# Patient Record
Sex: Female | Born: 1940 | Race: White | Hispanic: No | Smoking: Former smoker
Health system: Southern US, Community
[De-identification: ages and names within clinical notes are randomized; demographics above are authoritative.]

## PROBLEM LIST (undated history)

## (undated) DIAGNOSIS — M549 Dorsalgia, unspecified: Secondary | ICD-10-CM

## (undated) DIAGNOSIS — F329 Major depressive disorder, single episode, unspecified: Secondary | ICD-10-CM

## (undated) DIAGNOSIS — M1990 Unspecified osteoarthritis, unspecified site: Secondary | ICD-10-CM

## (undated) DIAGNOSIS — F419 Anxiety disorder, unspecified: Secondary | ICD-10-CM

## (undated) DIAGNOSIS — I10 Essential (primary) hypertension: Secondary | ICD-10-CM

## (undated) DIAGNOSIS — I219 Acute myocardial infarction, unspecified: Secondary | ICD-10-CM

## (undated) DIAGNOSIS — E785 Hyperlipidemia, unspecified: Secondary | ICD-10-CM

## (undated) DIAGNOSIS — G8929 Other chronic pain: Secondary | ICD-10-CM

## (undated) DIAGNOSIS — Z87442 Personal history of urinary calculi: Secondary | ICD-10-CM

## (undated) DIAGNOSIS — F32A Depression, unspecified: Secondary | ICD-10-CM

## (undated) DIAGNOSIS — I2510 Atherosclerotic heart disease of native coronary artery without angina pectoris: Secondary | ICD-10-CM

## (undated) DIAGNOSIS — M546 Pain in thoracic spine: Secondary | ICD-10-CM

## (undated) DIAGNOSIS — E039 Hypothyroidism, unspecified: Secondary | ICD-10-CM

## (undated) HISTORY — DX: Essential (primary) hypertension: I10

## (undated) HISTORY — PX: FOOT SURGERY: SHX648

## (undated) HISTORY — DX: Hypothyroidism, unspecified: E03.9

## (undated) HISTORY — DX: Unspecified osteoarthritis, unspecified site: M19.90

## (undated) HISTORY — DX: Atherosclerotic heart disease of native coronary artery without angina pectoris: I25.10

## (undated) HISTORY — PX: CATARACT EXTRACTION W/ INTRAOCULAR LENS IMPLANT: SHX1309

## (undated) HISTORY — DX: Hyperlipidemia, unspecified: E78.5

---

## 1996-12-03 HISTORY — PX: CORONARY ANGIOPLASTY WITH STENT PLACEMENT: SHX49

## 2001-12-30 ENCOUNTER — Ambulatory Visit (HOSPITAL_COMMUNITY): Admission: RE | Admit: 2001-12-30 | Discharge: 2001-12-30 | Payer: Self-pay | Admitting: Gastroenterology

## 2004-12-03 HISTORY — PX: CORONARY ANGIOPLASTY WITH STENT PLACEMENT: SHX49

## 2005-03-06 ENCOUNTER — Ambulatory Visit: Payer: Self-pay | Admitting: Cardiology

## 2005-03-19 ENCOUNTER — Ambulatory Visit: Payer: Self-pay

## 2005-03-29 ENCOUNTER — Ambulatory Visit: Payer: Self-pay

## 2005-04-03 ENCOUNTER — Ambulatory Visit (HOSPITAL_COMMUNITY): Admission: RE | Admit: 2005-04-03 | Discharge: 2005-04-04 | Payer: Self-pay | Admitting: Cardiology

## 2005-04-03 ENCOUNTER — Ambulatory Visit: Payer: Self-pay | Admitting: Cardiology

## 2005-04-26 ENCOUNTER — Ambulatory Visit: Payer: Self-pay | Admitting: Cardiology

## 2005-06-06 ENCOUNTER — Other Ambulatory Visit: Admission: RE | Admit: 2005-06-06 | Discharge: 2005-06-06 | Payer: Self-pay | Admitting: Family Medicine

## 2005-06-26 ENCOUNTER — Ambulatory Visit: Payer: Self-pay | Admitting: Cardiology

## 2005-11-30 ENCOUNTER — Ambulatory Visit: Payer: Self-pay | Admitting: Cardiology

## 2006-05-03 ENCOUNTER — Ambulatory Visit: Payer: Self-pay | Admitting: Cardiology

## 2006-12-03 HISTORY — PX: HAMMER TOE SURGERY: SHX385

## 2006-12-03 HISTORY — PX: CORONARY ANGIOPLASTY WITH STENT PLACEMENT: SHX49

## 2007-04-08 ENCOUNTER — Other Ambulatory Visit: Admission: RE | Admit: 2007-04-08 | Discharge: 2007-04-08 | Payer: Self-pay | Admitting: Family Medicine

## 2007-05-19 ENCOUNTER — Ambulatory Visit: Payer: Self-pay | Admitting: Cardiology

## 2007-05-27 ENCOUNTER — Ambulatory Visit: Payer: Self-pay

## 2007-06-11 ENCOUNTER — Ambulatory Visit: Payer: Self-pay | Admitting: Cardiology

## 2007-06-11 LAB — CONVERTED CEMR LAB
BUN: 16 mg/dL (ref 6–23)
Basophils Relative: 0.8 % (ref 0.0–1.0)
Eosinophils Absolute: 0.2 10*3/uL (ref 0.0–0.6)
Eosinophils Relative: 3.8 % (ref 0.0–5.0)
GFR calc Af Amer: 81 mL/min
GFR calc non Af Amer: 67 mL/min
Glucose, Bld: 124 mg/dL — ABNORMAL HIGH (ref 70–99)
INR: 0.9 (ref 0.9–2.0)
Lymphocytes Relative: 32.1 % (ref 12.0–46.0)
MCV: 87.7 fL (ref 78.0–100.0)
Monocytes Absolute: 0.2 10*3/uL (ref 0.2–0.7)
Neutrophils Relative %: 58.7 % (ref 43.0–77.0)
Platelets: 242 10*3/uL (ref 150–400)
Prothrombin Time: 11.3 s (ref 10.0–14.0)
RBC: 4.38 M/uL (ref 3.87–5.11)
Sodium: 139 meq/L (ref 135–145)
WBC: 4.4 10*3/uL — ABNORMAL LOW (ref 4.5–10.5)
aPTT: 19.8 s — ABNORMAL LOW (ref 26.5–36.5)

## 2007-06-16 ENCOUNTER — Inpatient Hospital Stay (HOSPITAL_BASED_OUTPATIENT_CLINIC_OR_DEPARTMENT_OTHER): Admission: RE | Admit: 2007-06-16 | Discharge: 2007-06-16 | Payer: Self-pay | Admitting: Cardiology

## 2007-06-16 ENCOUNTER — Ambulatory Visit: Payer: Self-pay | Admitting: Cardiology

## 2007-06-16 ENCOUNTER — Inpatient Hospital Stay (HOSPITAL_COMMUNITY): Admission: AD | Admit: 2007-06-16 | Discharge: 2007-06-17 | Payer: Self-pay | Admitting: Cardiology

## 2007-07-08 ENCOUNTER — Ambulatory Visit: Payer: Self-pay | Admitting: Cardiology

## 2008-02-27 ENCOUNTER — Ambulatory Visit: Payer: Self-pay | Admitting: Cardiology

## 2008-08-18 ENCOUNTER — Ambulatory Visit: Payer: Self-pay | Admitting: Cardiology

## 2008-11-03 ENCOUNTER — Ambulatory Visit: Payer: Self-pay | Admitting: Cardiology

## 2008-11-18 ENCOUNTER — Ambulatory Visit: Payer: Self-pay

## 2008-12-03 HISTORY — PX: CARDIAC CATHETERIZATION: SHX172

## 2008-12-08 ENCOUNTER — Ambulatory Visit: Payer: Self-pay | Admitting: Cardiology

## 2008-12-08 ENCOUNTER — Ambulatory Visit (HOSPITAL_COMMUNITY): Admission: RE | Admit: 2008-12-08 | Discharge: 2008-12-08 | Payer: Self-pay | Admitting: Cardiology

## 2008-12-20 ENCOUNTER — Ambulatory Visit: Payer: Self-pay | Admitting: Cardiology

## 2009-03-15 DIAGNOSIS — I10 Essential (primary) hypertension: Secondary | ICD-10-CM | POA: Insufficient documentation

## 2009-03-15 DIAGNOSIS — E783 Hyperchylomicronemia: Secondary | ICD-10-CM | POA: Insufficient documentation

## 2009-03-15 DIAGNOSIS — I2510 Atherosclerotic heart disease of native coronary artery without angina pectoris: Secondary | ICD-10-CM | POA: Insufficient documentation

## 2009-03-16 ENCOUNTER — Ambulatory Visit: Payer: Self-pay | Admitting: Cardiology

## 2009-03-16 ENCOUNTER — Encounter: Payer: Self-pay | Admitting: Cardiology

## 2009-03-18 ENCOUNTER — Telehealth (INDEPENDENT_AMBULATORY_CARE_PROVIDER_SITE_OTHER): Payer: Self-pay | Admitting: *Deleted

## 2009-04-08 ENCOUNTER — Encounter: Payer: Self-pay | Admitting: Cardiology

## 2009-04-08 ENCOUNTER — Other Ambulatory Visit: Admission: RE | Admit: 2009-04-08 | Discharge: 2009-04-08 | Payer: Self-pay | Admitting: Family Medicine

## 2009-06-14 ENCOUNTER — Ambulatory Visit: Payer: Self-pay | Admitting: Cardiology

## 2009-09-26 ENCOUNTER — Encounter: Payer: Self-pay | Admitting: Cardiology

## 2009-12-06 ENCOUNTER — Ambulatory Visit: Payer: Self-pay | Admitting: Cardiology

## 2009-12-07 LAB — CONVERTED CEMR LAB
GFR calc non Af Amer: 88.23 mL/min (ref >60)
Glucose, Bld: 90 mg/dL (ref 70–99)
Sodium: 142 meq/L (ref 135–145)

## 2010-01-03 DIAGNOSIS — I219 Acute myocardial infarction, unspecified: Secondary | ICD-10-CM

## 2010-01-03 HISTORY — DX: Acute myocardial infarction, unspecified: I21.9

## 2010-01-05 ENCOUNTER — Telehealth: Payer: Self-pay | Admitting: Cardiology

## 2010-01-08 ENCOUNTER — Ambulatory Visit: Payer: Self-pay | Admitting: Internal Medicine

## 2010-01-08 ENCOUNTER — Inpatient Hospital Stay (HOSPITAL_COMMUNITY): Admission: EM | Admit: 2010-01-08 | Discharge: 2010-01-10 | Payer: Self-pay | Admitting: Emergency Medicine

## 2010-01-17 ENCOUNTER — Encounter: Payer: Self-pay | Admitting: Cardiology

## 2010-01-17 ENCOUNTER — Ambulatory Visit: Payer: Self-pay | Admitting: Surgery

## 2010-01-23 ENCOUNTER — Encounter: Payer: Self-pay | Admitting: Surgery

## 2010-01-23 ENCOUNTER — Ambulatory Visit: Payer: Self-pay | Admitting: Vascular Surgery

## 2010-01-25 ENCOUNTER — Ambulatory Visit: Payer: Self-pay | Admitting: Surgery

## 2010-01-25 ENCOUNTER — Ambulatory Visit: Payer: Self-pay | Admitting: Cardiology

## 2010-01-25 ENCOUNTER — Inpatient Hospital Stay (HOSPITAL_COMMUNITY): Admission: RE | Admit: 2010-01-25 | Discharge: 2010-01-29 | Payer: Self-pay | Admitting: Surgery

## 2010-01-25 HISTORY — PX: CORONARY ARTERY BYPASS GRAFT: SHX141

## 2010-02-20 ENCOUNTER — Encounter: Admission: RE | Admit: 2010-02-20 | Discharge: 2010-02-20 | Payer: Self-pay | Admitting: Surgery

## 2010-02-20 ENCOUNTER — Ambulatory Visit: Payer: Self-pay | Admitting: Surgery

## 2010-02-21 ENCOUNTER — Ambulatory Visit: Payer: Self-pay | Admitting: Cardiology

## 2010-02-21 DIAGNOSIS — R079 Chest pain, unspecified: Secondary | ICD-10-CM | POA: Insufficient documentation

## 2010-02-23 ENCOUNTER — Ambulatory Visit: Payer: Self-pay | Admitting: Cardiology

## 2010-02-23 DIAGNOSIS — R0602 Shortness of breath: Secondary | ICD-10-CM | POA: Insufficient documentation

## 2010-02-23 DIAGNOSIS — J90 Pleural effusion, not elsewhere classified: Secondary | ICD-10-CM | POA: Insufficient documentation

## 2010-02-23 LAB — CONVERTED CEMR LAB
BUN: 16 mg/dL (ref 6–23)
Basophils Absolute: 0.1 10*3/uL (ref 0.0–0.1)
CO2: 29 meq/L (ref 19–32)
Chloride: 106 meq/L (ref 96–112)
Eosinophils Absolute: 0.2 10*3/uL (ref 0.0–0.7)
GFR calc non Af Amer: 75.58 mL/min (ref >60)
HCT: 36.7 % (ref 36.0–46.0)
MCHC: 33.3 g/dL (ref 30.0–36.0)
MCV: 91.9 fL (ref 78.0–100.0)
Monocytes Relative: 8.7 % (ref 3.0–12.0)
Neutro Abs: 2.9 10*3/uL (ref 1.4–7.7)
Neutrophils Relative %: 60.3 % (ref 43.0–77.0)
Platelets: 254 10*3/uL (ref 150.0–400.0)
Potassium: 4.5 meq/L (ref 3.5–5.1)
RBC: 4 M/uL (ref 3.87–5.11)
RDW: 13.7 % (ref 11.5–14.6)
Sodium: 143 meq/L (ref 135–145)

## 2010-02-24 ENCOUNTER — Ambulatory Visit (HOSPITAL_COMMUNITY): Admission: RE | Admit: 2010-02-24 | Discharge: 2010-02-24 | Payer: Self-pay | Admitting: Cardiology

## 2010-02-24 ENCOUNTER — Encounter: Payer: Self-pay | Admitting: Cardiology

## 2010-02-27 ENCOUNTER — Encounter: Admission: RE | Admit: 2010-02-27 | Discharge: 2010-02-27 | Payer: Self-pay | Admitting: Surgery

## 2010-02-27 ENCOUNTER — Ambulatory Visit: Payer: Self-pay | Admitting: Surgery

## 2010-03-22 ENCOUNTER — Ambulatory Visit: Payer: Self-pay | Admitting: Cardiology

## 2010-04-18 ENCOUNTER — Encounter: Payer: Self-pay | Admitting: Cardiology

## 2010-09-07 ENCOUNTER — Ambulatory Visit: Payer: Self-pay | Admitting: Cardiology

## 2010-10-19 ENCOUNTER — Encounter: Payer: Self-pay | Admitting: Cardiology

## 2011-01-04 NOTE — Assessment & Plan Note (Signed)
Summary: sob abn chest ct ./cy   Primary Provider:  Dr Clelia Croft   History of Present Illness: Erica [name] comes in today for a positive d-dimer. She's been having pleuritic chest pain on the left side. Please refer to Dr. Marian Sorrow extensive notes this week.  Her d-dimer was 2.53. I performed a chest CT today with contrast. Pre-CT creatinine was normal.  Dr. Karin Golden read it as showing a large left pleural effusion compressing the left lower lobe. It made it difficult to assess for pulmonary emboli and that area, however, he did not see any definite pulmonary embolus.  I spoken to Dr. Dickie La and we've arranged for her to have outpatient thoracentesis tomorrow with radiology. I've asked Erica. Porcher to start Aleve 400 mg twice a day as an anti-inflammatory. She'll continue her Lasix.  Allergies: No Known Drug Allergies  Past History:  Past Medical History: Last updated: 03/15/2009 1. Coronary artery disease, status post multiple percutaneous       interventions as described above with progression of disease in the       left anterior descending and diagonal branch with 80% narrowing in       the left anterior descending (bifurcation lesion), 70% narrowing in       the circumflex artery and 40% narrowing in the right coronary       artery.   2. Good left ventricular function.   3. Hypertension.   4. Hyperlipidemia.   5. Status post prior ankle surgery.   Social History: Last updated: 03/16/2009 She is married. 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 She does not smoke.  Review of Systems       negative other than history of present illness   Impression & Recommendations:  Problem # 1:  CHEST PAIN-UNSPECIFIED (ICD-786.50) Assessment Unchanged  Her updated medication list for this problem includes:    Atenolol 25 Mg Tabs (Atenolol) .Marland Kitchen... Take 1 once daily    Aspirin Ec 325 Mg Tbec (Aspirin) .Marland Kitchen... Take one tablet by mouth daily  Her updated medication  list for this problem includes:    Atenolol 25 Mg Tabs (Atenolol) .Marland Kitchen... Take 1 once daily    Aspirin Ec 325 Mg Tbec (Aspirin) .Marland Kitchen... Take one tablet by mouth daily  Problem # 2:  PLEURAL EFFUSION (ICD-511.9) Assessment: Deteriorated  Problem # 3:  SHORTNESS OF BREATH (ICD-786.05) Assessment: Unchanged  Her updated medication list for this problem includes:    Atenolol 25 Mg Tabs (Atenolol) .Marland Kitchen... Take 1 once daily    Aspirin Ec 325 Mg Tbec (Aspirin) .Marland Kitchen... Take one tablet by mouth daily    Furosemide 40 Mg Tabs (Furosemide) .Marland Kitchen... Take one tablet by mouth daily.  Her updated medication list for this problem includes:    Atenolol 25 Mg Tabs (Atenolol) .Marland Kitchen... Take 1 once daily    Aspirin Ec 325 Mg Tbec (Aspirin) .Marland Kitchen... Take one tablet by mouth daily    Furosemide 40 Mg Tabs (Furosemide) .Marland Kitchen... Take one tablet by mouth daily.  Patient Instructions: 1)  Your physician recommends that you schedule a follow-up appointment in: REPORT TO ADMITTING AT [facility] AT 10:45 FOR 11:00  thoracentisis

## 2011-01-04 NOTE — Progress Notes (Signed)
Summary: angina with exercise- in [place]  Phone Note Call from Patient Call back at Home Phone [phone] Call back at [phone] cell phone   Caller: Patient Reason for Call: Talk to Nurse Details for Reason: pt in [place] now will be in [place] on sat. c/o angina with exercise pt was admit ,but check out -AMA. Pt will bring records with her from [place].  Initial call taken by: Lorne Skeens,  January 05, 2010 12:38 PM  Follow-up for Phone Call        I called and spoke with the pt. She states she arrived in [place] on tues this week. She developed some CP at rest. She took 4 NTG tabs and had relief for only about 20 minutes after each tablet. She was taken to a hospital in [facility] and they cycled enzymes on her. She states the first set was normal, the second set was elevated , and she had the third set drawn, but left AMA prior to the results. She is out there with her sister- in -law who is wheeling her around everywhere b/c she will not let her get up. The pt denies any more CP since tuesday night. She will be getting all of her records from the hospital there. They wanted to do a stress test on the pt, but she refused any further testing until she could come home. She will be home saturday. I have scheduled the pt an appt to f/u with Dr. Juanda Chance on 01/09/10. The pt is agreeable.  Follow-up by: Sherri Rad, RN, BSN,  January 05, 2010 2:17 PM

## 2011-01-04 NOTE — Assessment & Plan Note (Signed)
Summary: f54m   Visit Type:  Follow-up Primary Provider:  Dr Clelia Croft   History of Present Illness: Erica [name] is 70 years old and return for management of CAD. After having multiple PCI procedures she underwent bypass surgery in February of 2010. She did well with surgery but developed pleural effusions after surgery requiring repeated drainage. This finally cleared and she is now doing well. She's had no cardiac symptoms. She does say she had some tightness in the chest wall when she does sit-ups.  Her past history is significant for hypertension hyperlipidemia. She had an excellent lipid profile in May except for her LDL is slightly not at target at 90.  Current Medications (verified): 1)  Atenolol 25 Mg Tabs (Atenolol) .... Take 1 Once Daily 2)  Welchol 625 Mg Tabs (Colesevelam Hcl) .... Take 3 By Mouth Two Times A Day 3)  Levoxyl 88 Mcg Tabs (Levothyroxine Sodium) .... Take 1 Once Daily 4)  Lipitor 40 Mg Tabs (Atorvastatin Calcium) .... Take 1 Once Daily 5)  Aspirin Ec 325 Mg Tbec (Aspirin) .... Take One Tablet By Mouth Daily 6)  Multivitamins  Tabs (Multiple Vitamin) .... Take 1 Once Daily 7)  Caltrate 600+d Plus 600-400 Mg-Unit Tabs (Calcium Carbonate-Vit D-Min) .... Take 1 Once Daily 8)  Vitamin D3 1000 Unit Tabs (Cholecalciferol) .... One Tab By Mouth Once Daily 9)  Aleve .... As Needed  Allergies (verified): No Known Drug Allergies  Past History:  Past Medical History: Reviewed history from 03/15/2009 and no changes required. 1. Coronary artery disease, status post multiple percutaneous       interventions as described above with progression of disease in the       left anterior descending and diagonal branch with 80% narrowing in       the left anterior descending (bifurcation lesion), 70% narrowing in       the circumflex artery and 40% narrowing in the right coronary       artery.   2. Good left ventricular function.   3. Hypertension.   4. Hyperlipidemia.   5. Status  post prior ankle surgery.   Review of Systems       ROS is negative except as outlined in HPI.   Vital Signs:  Patient profile:   70 year old female Height:      64 inches Weight:      128 pounds Pulse rate:   51 / minute BP sitting:   126 / 64  (left arm)  Vitals Entered By: Burnett Kanaris, CNA (September 07, 2010 2:01 PM)  Physical Exam  Additional Exam:  Gen. Well-nourished, in no distress   Neck: No JVD, thyroid not enlarged, no carotid bruits Lungs: No tachypnea, clear without rales, rhonchi or wheezes Cardiovascular: Rhythm regular, PMI not displaced,  heart sounds  normal, no murmurs or gallops, no peripheral edema, pulses normal in all 4 extremities. Abdomen: BS normal, abdomen soft and non-tender without masses or organomegaly, no hepatosplenomegaly. MS: No deformities, no cyanosis or clubbing   Neuro:  No focal sns   Skin:  no lesions    Impression & Recommendations:  Problem # 1:  CAD, NATIVE VESSEL (ICD-414.01)  She is status post bypass surgery February 2011. She's had no angina or cardiac symptoms. She still has some mild chest wall discomfort. This problem appears stable. Her updated medication list for this problem includes:    Atenolol 25 Mg Tabs (Atenolol) .Marland Kitchen... Take 1 once daily    Aspirin Ec 325 Mg  Tbec (Aspirin) .Marland Kitchen... Take one tablet by mouth daily  Her updated medication list for this problem includes:    Atenolol 25 Mg Tabs (Atenolol) .Marland Kitchen... Take 1 once daily    Aspirin Ec 325 Mg Tbec (Aspirin) .Marland Kitchen... Take one tablet by mouth daily  Orders: EKG w/ Interpretation (93000)  Problem # 2:  HYPERTENSION, UNSPECIFIED (ICD-401.9) This is well controlled on current medications. The following medications were removed from the medication list:    Furosemide 40 Mg Tabs (Furosemide) .Marland Kitchen... Take one tablet by mouth daily. Her updated medication list for this problem includes:    Atenolol 25 Mg Tabs (Atenolol) .Marland Kitchen... Take 1 once daily    Aspirin Ec 325 Mg Tbec  (Aspirin) .Marland Kitchen... Take one tablet by mouth daily  The following medications were removed from the medication list:    Furosemide 40 Mg Tabs (Furosemide) .Marland Kitchen... Take one tablet by mouth daily. Her updated medication list for this problem includes:    Atenolol 25 Mg Tabs (Atenolol) .Marland Kitchen... Take 1 once daily    Aspirin Ec 325 Mg Tbec (Aspirin) .Marland Kitchen... 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 Her updated medication list for this problem includes:    Welchol 625 Mg Tabs (Colesevelam hcl) .Marland Kitchen... Take 3 by mouth two times a day    Lipitor 40 Mg Tabs (Atorvastatin calcium) .Marland Kitchen... Take 1 once daily  Patient Instructions: 1)  Your physician recommends that you continue on your current medications as directed. Please refer to the Current Medication list given to you today. 2)  Your physician wants you to follow-up in: 1 year with Dr. Excell Seltzer.   You will receive a reminder letter in the mail two months in advance. If you don't receive a letter, please call our office to schedule the follow-up appointment.

## 2011-01-04 NOTE — Assessment & Plan Note (Signed)
Summary: f43m   Visit Type:  Follow-up Primary Provider:  Dr Clelia Croft   History of Present Illness: The patient is 70 years old and return for management of CAD. 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 She initially did fairly well but developed bilateral pleural effusions and pleuritic chest pain. We did a d-dimer which was positive and she had a CT scan which was negative for pulmonary and was in but showed a very large effusion. She underwent thoracentesis by radiology, and has felt much better since that time.  She has had no recent chest pain shortness of breath or palpitations. She's getting back into her usual activities.  Her past history is significant for hypertension hyperlipidemia.  She did have a follow up chest x-ray with Dr. Carolyn Stare which he said look good.  Current Medications (verified): 1)  Atenolol 25 Mg Tabs (Atenolol) .... Take 1 Once Daily 2)  Welchol 625 Mg Tabs (Colesevelam Hcl) .... Take 3 By Mouth Two Times A Day 3)  Levoxyl 88 Mcg Tabs (Levothyroxine Sodium) .... Take 1 Once Daily 4)  Lipitor 40 Mg Tabs (Atorvastatin Calcium) .... Take 1 Once Daily 5)  Aspirin Ec 325 Mg Tbec (Aspirin) .... Take One Tablet By Mouth Daily 6)  Multivitamins  Tabs (Multiple Vitamin) .... Take 1 Once Daily 7)  Caltrate 600+d Plus 600-400 Mg-Unit Tabs (Calcium Carbonate-Vit D-Min) .... Take 1 Once Daily 8)  Vitamin D3 1000 Unit Tabs (Cholecalciferol) .... One Tab By Mouth Once Daily 9)  Vitamin C 500 Mg Tabs (Ascorbic Acid) .... One Tab By Mouth Once Daily 10)  Furosemide 40 Mg Tabs (Furosemide) .... Take One Tablet By Mouth Daily. 11)  Klor-Con M10 10 Meq Cr-Tabs (Potassium Chloride Crys Cr) .... Take Two Tabs By Mouth Once Daily 12)  Oxycodone Hcl 5 Mg Tabs (Oxycodone Hcl) .... One Tab By Mouth Every 4-6 Hours As Needed 13)  Aleve .... As Needed  Allergies (verified): No Known Drug Allergies  Past History:  Past Medical History: Reviewed history from  03/15/2009 and no changes required. 1. Coronary artery disease, status post multiple percutaneous       interventions as described above with progression of disease in the       left anterior descending and diagonal branch with 80% narrowing in       the left anterior descending (bifurcation lesion), 70% narrowing in       the circumflex artery and 40% narrowing in the right coronary       artery.   2. Good left ventricular function.   3. Hypertension.   4. Hyperlipidemia.   5. Status post prior ankle surgery.   Review of Systems       ROS is negative except as outlined in HPI.   Vital Signs:  Patient profile:   70 year old female Height:      64 inches Weight:      125 pounds BMI:     21.53 Pulse rate:   60 / minute BP sitting:   130 / 67  (left arm) Cuff size:   regular  Vitals Entered By: Burnett Kanaris, CNA (March 22, 2010 2:58 PM)  Physical Exam  Additional Exam:  Gen. Well-nourished, in no distress   Neck: No JVD, thyroid not enlarged, no carotid bruits Lungs: No tachypnea, clear without rales, rhonchi or wheezes Cardiovascular: Rhythm regular, PMI not displaced,  heart sounds  normal, no murmurs or gallops, no peripheral edema, pulses normal in  all 4 extremities. Abdomen: BS normal, abdomen soft and non-tender without masses or organomegaly, no hepatosplenomegaly. MS: No deformities, no cyanosis or clubbing   Neuro:  No focal sns   Skin:  no lesions    Impression & Recommendations:  Problem # 1:  CAD, NATIVE VESSEL (ICD-414.01) she is now almost 2 months post bypass surgery. She is now doing much better. She says she felt much better once her pleural effusion was drained and she's had no recurrent chest pain. Her lungs sound good on exam today. We will continue current therapy. Her updated medication list for this problem includes:    Atenolol 25 Mg Tabs (Atenolol) .Marland Kitchen... Take 1 once daily    Aspirin Ec 325 Mg Tbec (Aspirin) .Marland Kitchen... Take one tablet by mouth  daily  Problem # 2:  HYPERTENSION, UNSPECIFIED (ICD-401.9) This appears well controlled on current medications. Her updated medication list for this problem includes:    Atenolol 25 Mg Tabs (Atenolol) .Marland Kitchen... Take 1 once daily    Aspirin Ec 325 Mg Tbec (Aspirin) .Marland Kitchen... Take one tablet by mouth daily    Furosemide 40 Mg Tabs (Furosemide) .Marland Kitchen... Take one tablet by mouth daily.  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 Her updated medication list for this problem includes:    Welchol 625 Mg Tabs (Colesevelam hcl) .Marland Kitchen... Take 3 by mouth two times a day    Lipitor 40 Mg Tabs (Atorvastatin calcium) .Marland Kitchen... Take 1 once daily  Patient Instructions: 1)  Your physician wants you to follow-up in: 6 months.   You will receive a reminder letter in the mail two months in advance. If you don't receive a letter, please call our office to schedule the follow-up appointment.

## 2011-01-04 NOTE — Assessment & Plan Note (Signed)
Summary: per check out   Visit Type:  Follow-up Primary Provider:  Dr Clelia Croft  CC:  pt had eposide of chest pain during christmas time.  History of Present Illness: Patient is 70 years old and return for management of CAD. She had remote stenting of the circumflex artery and remote rotational atherectomy and stenting of the right coronary. In January of 2010 she underwent catheterization after having an abnormal stress ECG. This showed 80% narrowing in the proximal LAD with a positive fractional flow reserve. We recommended surgery but she preferred continued medical therapy which we have done.  She has done fairly well. She has had only occasional chest pain. She had one over the Christmas holidays for which he took a nitroglycerin but this is the only nitroglycerin she has taken 6 months. She goes to the [facility] on a regular basis and exercises there and does not have chest pain when she walks on the elliptical trainer.  Her other problems include hypertension hyperlipidemia. She has also had an ankle injury and surgery was contemplated earlier but this has been postponed because of her cardiac condition.  Current Medications (verified): 1)  Atenolol 25 Mg Tabs (Atenolol) .... Take 1 Once Daily 2)  Nitrostat 0.4 Mg Subl (Nitroglycerin) 3)  Plavix 75 Mg Tabs (Clopidogrel Bisulfate) 4)  Welchol 625 Mg Tabs (Colesevelam Hcl) .... Take 3 By Mouth Two Times A Day 5)  Levoxyl 88 Mcg Tabs (Levothyroxine Sodium) .... Take 1 Once Daily 6)  Lipitor 40 Mg Tabs (Atorvastatin Calcium) .... Take 1 Once Daily 7)  Imdur 30 Mg Xr24h-Tab (Isosorbide Mononitrate) .... Take 1 Once Daily 8)  Bayer Aspirin Ec Low Dose 81 Mg Tbec (Aspirin) .... Take 1 Once Daily 9)  Multivitamins  Tabs (Multiple Vitamin) .... Take 1 Once Daily 10)  Caltrate 600+d Plus 600-400 Mg-Unit Tabs (Calcium Carbonate-Vit D-Min) .... Take 1 Once Daily 11)  Vit D .... Take 1 Once Daily 12)  Vit C .... Take 1 Once Daily  Allergies  (verified): No Known Drug Allergies  Past History:  Past Medical History: Reviewed history from 03/15/2009 and no changes required. 1. Coronary artery disease, status post multiple percutaneous       interventions as described above with progression of disease in the       left anterior descending and diagonal branch with 80% narrowing in       the left anterior descending (bifurcation lesion), 70% narrowing in       the circumflex artery and 40% narrowing in the right coronary       artery.   2. Good left ventricular function.   3. Hypertension.   4. Hyperlipidemia.   5. Status post prior ankle surgery.   Review of Systems       ROS is negative except as outlined in HPI.   Vital Signs:  Patient profile:   70 year old female Height:      64 inches Weight:      127 pounds Pulse rate:   55 / minute BP sitting:   120 / 67  (left arm) Cuff size:   regular  Vitals Entered By: Burnett Kanaris, CNA (December 06, 2009 10:26 AM)  Physical Exam  Additional Exam:  Gen. Well-nourished, in no distress   Neck: No JVD, thyroid not enlarged, no carotid bruits Lungs: No tachypnea, clear without rales, rhonchi or wheezes Cardiovascular: Rhythm regular, PMI not displaced,  heart sounds  normal, no murmurs or gallops, no peripheral edema, pulses normal  in all 4 extremities. Abdomen: BS normal, abdomen soft and non-tender without masses or organomegaly, no hepatosplenomegaly. MS: No deformities, no cyanosis or clubbing   Neuro:  No focal sns   Skin:  no lesions    Impression & Recommendations:  Problem # 1:  CAD, NATIVE VESSEL (ICD-414.01)  Her updated medication list for this problem includes:    Atenolol 25 Mg Tabs (Atenolol) .Marland Kitchen... Take 1 once daily    Nitrostat 0.4 Mg Subl (Nitroglycerin)    Plavix 75 Mg Tabs (Clopidogrel bisulfate)    Imdur 30 Mg Xr24h-tab (Isosorbide mononitrate) .Marland Kitchen... Take 1 once daily    Bayer Aspirin Ec Low Dose 81 Mg Tbec (Aspirin) .Marland Kitchen... Take 1 once  daily  Her updated medication list for this problem includes:    Atenolol 25 Mg Tabs (Atenolol) .Marland Kitchen... Take 1 once daily    Nitrostat 0.4 Mg Subl (Nitroglycerin)    Plavix 75 Mg Tabs (Clopidogrel bisulfate)    Imdur 30 Mg Xr24h-tab (Isosorbide mononitrate) .Marland Kitchen... Take 1 once daily    Bayer Aspirin Ec Low Dose 81 Mg Tbec (Aspirin) .Marland Kitchen... Take 1 once daily  Orders: EKG w/ Interpretation (93000) TLB-BMP (Basic Metabolic Panel-BMET) (80048-METABOL)  Problem # 2:  HYPERTENSION, UNSPECIFIED (ICD-401.9)  This is well-controlled on current medications. Her updated medication list for this problem includes:    Atenolol 25 Mg Tabs (Atenolol) .Marland Kitchen... Take 1 once daily    Bayer Aspirin Ec Low Dose 81 Mg Tbec (Aspirin) .Marland Kitchen... Take 1 once daily  Her updated medication list for this problem includes:    Atenolol 25 Mg Tabs (Atenolol) .Marland Kitchen... Take 1 once daily    Bayer Aspirin Ec Low Dose 81 Mg Tbec (Aspirin) .Marland Kitchen... 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 Sherryll Burger was excellent with a total cholesterol of 127, and HDL of 54, and an LDL of 70. We will continue current therapy. Her updated medication list for this problem includes:    Welchol 625 Mg Tabs (Colesevelam hcl) .Marland Kitchen... Take 3 by mouth two times a day    Lipitor 40 Mg Tabs (Atorvastatin calcium) .Marland Kitchen... Take 1 once daily  Her updated medication list for this problem includes:    Welchol 625 Mg Tabs (Colesevelam hcl) .Marland Kitchen... Take 3 by mouth two times a day    Lipitor 40 Mg Tabs (Atorvastatin calcium) .Marland Kitchen... Take 1 once daily  Patient Instructions: 1)  Your physician recommends that you have lab work today: bmet (414.01) 2)  Your physician wants you to follow-up in: 6 months.  You will receive a reminder letter in the mail two months in advance. If you don't receive a letter, please call our office to schedule the follow-up appointment.

## 2011-01-04 NOTE — Assessment & Plan Note (Signed)
Summary: eph   Visit Type:  Post hospital  Primary Provider:  Dr Clelia Croft  CC:  The pt is s/p CABG- she reports some fluid in her lungs and was started on furosemide and potassium yesterday.Marland Kitchen  History of Present Illness: The patient is 70 years old returned after her recent bypass surgery. She has had multiple prior PCI procedures. She was admitted in December with a non-ST elevation MI and underwent catheterization and was found to have critical disease in her LAD and circumflex arteri She initially deferred surgery but then agreed to surgery and underwent bypass surgery by Dr. Lavinia Sharps on November 24, 2009.  She has done fairly well since that time but about 5 days ago she developed some left-sided pleuritic chest pain. She was seen by cardiac surgery yesterday and found to have a left effusion on x-ray was done on Lasix and potassium. I reviewed the x-rays from her postoperative state and she did have bilateral effusions then but it was a portable chest x-ray and it is difficult to make a comparison.  Other problems include hypertension and hyperlipidemia.  Current Medications (verified): 1)  Atenolol 25 Mg Tabs (Atenolol) .... Take 1 Once Daily 2)  Welchol 625 Mg Tabs (Colesevelam Hcl) .... Take 3 By Mouth Two Times A Day 3)  Levoxyl 88 Mcg Tabs (Levothyroxine Sodium) .... Take 1 Once Daily 4)  Lipitor 40 Mg Tabs (Atorvastatin Calcium) .... Take 1 Once Daily 5)  Aspirin Ec 325 Mg Tbec (Aspirin) .... Take One Tablet By Mouth Daily 6)  Multivitamins  Tabs (Multiple Vitamin) .... Take 1 Once Daily 7)  Caltrate 600+d Plus 600-400 Mg-Unit Tabs (Calcium Carbonate-Vit D-Min) .... Take 1 Once Daily 8)  Vitamin D3 1000 Unit Tabs (Cholecalciferol) .... One Tab By Mouth Once Daily 9)  Vitamin C 500 Mg Tabs (Ascorbic Acid) .... One Tab By Mouth Once Daily 10)  Furosemide 40 Mg Tabs (Furosemide) .... Take One Tablet By Mouth Daily. 11)  Potassium Chloride Crys Cr 20 Meq Cr-Tabs (Potassium Chloride Crys  Cr) .... Take One Tablet By Mouth Daily 12)  Oxycodone Hcl 5 Mg Tabs (Oxycodone Hcl) .... One Tab By Mouth Every 4-6 Hours As Needed  Allergies (verified): No Known Drug Allergies  Past History:  Past Medical History: Reviewed history from 03/15/2009 and no changes required. 1. Coronary artery disease, status post multiple percutaneous       interventions as described above with progression of disease in the       left anterior descending and diagonal branch with 80% narrowing in       the left anterior descending (bifurcation lesion), 70% narrowing in       the circumflex artery and 40% narrowing in the right coronary       artery.   2. Good left ventricular function.   3. Hypertension.   4. Hyperlipidemia.   5. Status post prior ankle surgery.   Review of Systems       ROS is negative except as outlined in HPI.   Vital Signs:  Patient profile:   70 year old female Height:      64 inches Weight:      125.50 pounds BMI:     21.62 Pulse rate:   58 / minute BP supine:   121 / 63  (left arm) Cuff size:   regular  Vitals Entered By: Burnett Kanaris, CNA (February 21, 2010 4:34 PM)  Physical Exam  Additional Exam:  Gen. Well-nourished, in no distress  Neck: No JVD, thyroid not enlarged, no carotid bruits Lungs: No tachypnea, decreased breath sounds left base, rhonchi or wheezes Cardiovascular: Rhythm regular, PMI not displaced,  heart sounds  normal, no murmurs or gallops, no peripheral edema, pulses normal in all 4 extremities. Abdomen: BS normal, abdomen soft and non-tender without masses or organomegaly, no hepatosplenomegaly. MS: No deformities, no cyanosis or clubbing   Neuro:  No focal sns   Skin:  no lesions    Impression & Recommendations:  Problem # 1:  CAD, NATIVE VESSEL (ICD-414.01) She had bypass surgery November 24, 2009 after multiple PCI procedures and after a non-ST elevation MI. She is now doing well from the standpoint of her heart. The following  medications were removed from the medication list:    Nitrostat 0.4 Mg Subl (Nitroglycerin)    Plavix 75 Mg Tabs (Clopidogrel bisulfate)    Imdur 30 Mg Xr24h-tab (Isosorbide mononitrate) .Marland Kitchen... Take 1 once daily Her updated medication list for this problem includes:    Atenolol 25 Mg Tabs (Atenolol) .Marland Kitchen... Take 1 once daily    Aspirin Ec 325 Mg Tbec (Aspirin) .Marland Kitchen... Take one tablet by mouth daily  Orders: EKG w/ Interpretation (93000)  Problem # 2:  CHEST PAIN-UNSPECIFIED (ICD-786.50) About 5-6 days ago she developed left pleuritic chest pain and has a left effusion on x-ray. She is now being treated with diuretics. This is probably a postoperative effusion but because it occurred rather suddenly we will check a d-dimer to help screen for pulmonary embolism. We will give her some smaller potassium pills that he shouldn't tolerate. She has a followup in a week with cardiac surgery. The following medications were removed from the medication list:    Nitrostat 0.4 Mg Subl (Nitroglycerin)    Plavix 75 Mg Tabs (Clopidogrel bisulfate)    Imdur 30 Mg Xr24h-tab (Isosorbide mononitrate) .Marland Kitchen... Take 1 once daily Her updated medication list for this problem includes:    Atenolol 25 Mg Tabs (Atenolol) .Marland Kitchen... Take 1 once daily    Aspirin Ec 325 Mg Tbec (Aspirin) .Marland Kitchen... Take one tablet by mouth daily  Problem # 3:  HYPERTENSION, UNSPECIFIED (ICD-401.9) This is well controlled on current medications. Her updated medication list for this problem includes:    Atenolol 25 Mg Tabs (Atenolol) .Marland Kitchen... Take 1 once daily    Aspirin Ec 325 Mg Tbec (Aspirin) .Marland Kitchen... Take one tablet by mouth daily    Furosemide 40 Mg Tabs (Furosemide) .Marland Kitchen... Take one tablet by mouth daily.  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 Her updated medication list for this problem includes:    Welchol 625 Mg Tabs (Colesevelam hcl) .Marland Kitchen... Take 3 by mouth  two times a day    Lipitor 40 Mg Tabs (Atorvastatin calcium) .Marland Kitchen... Take 1 once daily  Patient Instructions: 1)  Your physician recommends that you schedule a follow-up appointment in: 1 month 2)  Your physician recommends that you return for lab work this week: bmet/cbc/d-dimer (414.01;786.05) 3)  Your physician has recommended you make the following change in your medication: 1) change potassium to Micro K two tabs by mouth once daily  Prescriptions: KLOR-CON M10 10 MEQ CR-TABS (POTASSIUM CHLORIDE CRYS CR) take two tabs by mouth once daily  #60 x 6   Entered by:   Sherri Rad, RN, BSN   Authorized by:   Lenoria Farrier, MD, [facility]   Signed by:   Sherri Rad,  RN, BSN on 02/21/2010   Method used:   Electronically to        [facility]* (retail)       [address] [place], [place]  [address]       Ph: [phone]       Fax: [phone]   RxID:   [id number]

## 2011-01-04 NOTE — Letter (Signed)
Summary: [facility]   [facility]   Imported By: Roderic Ovens 02/09/2010 15:12:26  _____________________________________________________________________  External Attachment:    Type:   Image     Comment:   External Document

## 2011-02-21 LAB — GLUCOSE, CAPILLARY
Glucose-Capillary: 105 mg/dL — ABNORMAL HIGH (ref 70–99)
Glucose-Capillary: 105 mg/dL — ABNORMAL HIGH (ref 70–99)
Glucose-Capillary: 106 mg/dL — ABNORMAL HIGH (ref 70–99)
Glucose-Capillary: 110 mg/dL — ABNORMAL HIGH (ref 70–99)
Glucose-Capillary: 122 mg/dL — ABNORMAL HIGH (ref 70–99)
Glucose-Capillary: 83 mg/dL (ref 70–99)
Glucose-Capillary: 94 mg/dL (ref 70–99)

## 2011-02-21 LAB — POCT I-STAT, CHEM 8
BUN: 12 mg/dL (ref 6–23)
BUN: 14 mg/dL (ref 6–23)
Calcium, Ion: 1.09 mmol/L — ABNORMAL LOW (ref 1.12–1.32)
Calcium, Ion: 1.22 mmol/L (ref 1.12–1.32)
Chloride: 109 meq/L (ref 96–112)
Chloride: 112 meq/L (ref 96–112)
Creatinine, Ser: 0.9 mg/dL (ref 0.4–1.2)
Creatinine, Ser: 0.9 mg/dL (ref 0.4–1.2)
Glucose, Bld: 117 mg/dL — ABNORMAL HIGH (ref 70–99)
Glucose, Bld: 135 mg/dL — ABNORMAL HIGH (ref 70–99)
HCT: 27 % — ABNORMAL LOW (ref 36.0–46.0)
HCT: 28 % — ABNORMAL LOW (ref 36.0–46.0)
Hemoglobin: 9.2 g/dL — ABNORMAL LOW (ref 12.0–15.0)
Hemoglobin: 9.5 g/dL — ABNORMAL LOW (ref 12.0–15.0)
Potassium: 4 meq/L (ref 3.5–5.1)
Potassium: 4.1 meq/L (ref 3.5–5.1)
Sodium: 140 meq/L (ref 135–145)
Sodium: 142 meq/L (ref 135–145)
TCO2: 24 mmol/L (ref 0–100)
TCO2: 26 mmol/L (ref 0–100)

## 2011-02-21 LAB — POCT CARDIAC MARKERS
CKMB, poc: 1.1 ng/mL (ref 1.0–8.0)
Troponin i, poc: <0.05 ng/mL (ref 0.00–0.09)

## 2011-02-21 LAB — LIPID PANEL
LDL Cholesterol: 66 mg/dL (ref 0–99)
Total CHOL/HDL Ratio: 2.7 RATIO
Triglycerides: 87 mg/dL (ref <150)
VLDL: 17 mg/dL (ref 0–40)

## 2011-02-21 LAB — TYPE AND SCREEN: ABO/RH(D): A NEG

## 2011-02-21 LAB — BASIC METABOLIC PANEL WITH GFR
BUN: 11 mg/dL (ref 6–23)
BUN: 9 mg/dL (ref 6–23)
CO2: 23 meq/L (ref 19–32)
CO2: 26 meq/L (ref 19–32)
Calcium: 7.4 mg/dL — ABNORMAL LOW (ref 8.4–10.5)
Calcium: 7.7 mg/dL — ABNORMAL LOW (ref 8.4–10.5)
Chloride: 109 meq/L (ref 96–112)
Chloride: 110 meq/L (ref 96–112)
Creatinine, Ser: 0.58 mg/dL (ref 0.4–1.2)
Creatinine, Ser: 0.65 mg/dL (ref 0.4–1.2)
GFR calc non Af Amer: >60 mL/min
GFR calc non Af Amer: >60 mL/min
Glucose, Bld: 103 mg/dL — ABNORMAL HIGH (ref 70–99)
Glucose, Bld: 112 mg/dL — ABNORMAL HIGH (ref 70–99)
Potassium: 3.7 meq/L (ref 3.5–5.1)
Potassium: 3.8 meq/L (ref 3.5–5.1)
Sodium: 139 meq/L (ref 135–145)
Sodium: 139 meq/L (ref 135–145)

## 2011-02-21 LAB — CBC
HCT: 27.1 % — ABNORMAL LOW (ref 36.0–46.0)
HCT: 29.6 % — ABNORMAL LOW (ref 36.0–46.0)
HCT: 33.8 % — ABNORMAL LOW (ref 36.0–46.0)
HCT: 38.3 % (ref 36.0–46.0)
HCT: 40.4 % (ref 36.0–46.0)
Hemoglobin: 10.3 g/dL — ABNORMAL LOW (ref 12.0–15.0)
Hemoglobin: 12 g/dL (ref 12.0–15.0)
Hemoglobin: 13.1 g/dL (ref 12.0–15.0)
Hemoglobin: 14 g/dL (ref 12.0–15.0)
Hemoglobin: 9.5 g/dL — ABNORMAL LOW (ref 12.0–15.0)
MCHC: 34.3 g/dL (ref 30.0–36.0)
MCHC: 34.7 g/dL (ref 30.0–36.0)
MCHC: 34.7 g/dL (ref 30.0–36.0)
MCHC: 34.8 g/dL (ref 30.0–36.0)
MCHC: 34.9 g/dL (ref 30.0–36.0)
MCHC: 35.8 g/dL (ref 30.0–36.0)
MCV: 88.3 fL (ref 78.0–100.0)
MCV: 89.5 fL (ref 78.0–100.0)
MCV: 89.8 fL (ref 78.0–100.0)
MCV: 89.9 fL (ref 78.0–100.0)
MCV: 89.9 fL (ref 78.0–100.0)
MCV: 90.3 fL (ref 78.0–100.0)
Platelets: 119 K/uL — ABNORMAL LOW (ref 150–400)
Platelets: 161 K/uL (ref 150–400)
Platelets: 183 10*3/uL (ref 150–400)
Platelets: 189 10*3/uL (ref 150–400)
RBC: 3 MIL/uL — ABNORMAL LOW (ref 3.87–5.11)
RBC: 3.29 MIL/uL — ABNORMAL LOW (ref 3.87–5.11)
RBC: 3.76 MIL/uL — ABNORMAL LOW (ref 3.87–5.11)
RDW: 13.4 % (ref 11.5–15.5)
RDW: 13.5 % (ref 11.5–15.5)
RDW: 13.5 % (ref 11.5–15.5)
RDW: 13.6 % (ref 11.5–15.5)
RDW: 14 % (ref 11.5–15.5)
WBC: 5.1 10*3/uL (ref 4.0–10.5)
WBC: 5.6 10*3/uL (ref 4.0–10.5)
WBC: 5.7 10*3/uL (ref 4.0–10.5)
WBC: 7.4 K/uL (ref 4.0–10.5)
WBC: 9.1 K/uL (ref 4.0–10.5)

## 2011-02-21 LAB — POCT I-STAT 4, (NA,K, GLUC, HGB,HCT)
Glucose, Bld: 104 mg/dL — ABNORMAL HIGH (ref 70–99)
Glucose, Bld: 124 mg/dL — ABNORMAL HIGH (ref 70–99)
Glucose, Bld: 83 mg/dL (ref 70–99)
Glucose, Bld: 93 mg/dL (ref 70–99)
HCT: 20 % — ABNORMAL LOW (ref 36.0–46.0)
HCT: 22 % — ABNORMAL LOW (ref 36.0–46.0)
HCT: 23 % — ABNORMAL LOW (ref 36.0–46.0)
HCT: 31 % — ABNORMAL LOW (ref 36.0–46.0)
Hemoglobin: 7.1 g/dL — ABNORMAL LOW (ref 12.0–15.0)
Hemoglobin: 7.8 g/dL — ABNORMAL LOW (ref 12.0–15.0)
Hemoglobin: 9.2 g/dL — ABNORMAL LOW (ref 12.0–15.0)
Potassium: 3.5 mEq/L (ref 3.5–5.1)
Potassium: 5.1 mEq/L (ref 3.5–5.1)
Potassium: 5.4 mEq/L — ABNORMAL HIGH (ref 3.5–5.1)
Sodium: 131 mEq/L — ABNORMAL LOW (ref 135–145)
Sodium: 132 mEq/L — ABNORMAL LOW (ref 135–145)
Sodium: 136 mEq/L (ref 135–145)
Sodium: 139 mEq/L (ref 135–145)

## 2011-02-21 LAB — PREPARE PLATELETS

## 2011-02-21 LAB — URINE MICROSCOPIC-ADD ON

## 2011-02-21 LAB — POCT I-STAT 3, ART BLOOD GAS (G3+)
Acid-base deficit: 5 mmol/L — ABNORMAL HIGH (ref 0.0–2.0)
Bicarbonate: 20.3 meq/L (ref 20.0–24.0)
Bicarbonate: 21.7 mEq/L (ref 20.0–24.0)
Bicarbonate: 23.9 mEq/L (ref 20.0–24.0)
Bicarbonate: 26 mEq/L — ABNORMAL HIGH (ref 20.0–24.0)
O2 Saturation: 100 %
O2 Saturation: 100 %
O2 Saturation: 100 %
O2 Saturation: 99 %
Patient temperature: 37.2
TCO2: 21 mmol/L (ref 0–100)
TCO2: 23 mmol/L (ref 0–100)
TCO2: 25 mmol/L (ref 0–100)
TCO2: 27 mmol/L (ref 0–100)
TCO2: 27 mmol/L (ref 0–100)
pCO2 arterial: 31.5 mmHg — ABNORMAL LOW (ref 35.0–45.0)
pCO2 arterial: 39.5 mmHg (ref 35.0–45.0)
pCO2 arterial: 39.5 mmHg (ref 35.0–45.0)
pH, Arterial: 7.319 — ABNORMAL LOW (ref 7.350–7.400)
pH, Arterial: 7.368 (ref 7.350–7.400)
pH, Arterial: 7.416 — ABNORMAL HIGH (ref 7.350–7.400)
pH, Arterial: 7.436 — ABNORMAL HIGH (ref 7.350–7.400)
pO2, Arterial: 143 mmHg — ABNORMAL HIGH (ref 80.0–100.0)
pO2, Arterial: 338 mmHg — ABNORMAL HIGH (ref 80.0–100.0)
pO2, Arterial: 67 mmHg — ABNORMAL LOW (ref 80.0–100.0)

## 2011-02-21 LAB — CARDIAC PANEL(CRET KIN+CKTOT+MB+TROPI)
CK, MB: 0.8 ng/mL (ref 0.3–4.0)
Total CK: 33 U/L (ref 7–177)
Troponin I: 0.01 ng/mL (ref 0.00–0.06)

## 2011-02-21 LAB — DIFFERENTIAL
Eosinophils Absolute: 0.2 10*3/uL (ref 0.0–0.7)
Lymphocytes Relative: 37 % (ref 12–46)
Lymphs Abs: 2.1 10*3/uL (ref 0.7–4.0)
Monocytes Absolute: 0.5 10*3/uL (ref 0.1–1.0)
Neutro Abs: 2.9 10*3/uL (ref 1.7–7.7)

## 2011-02-21 LAB — COMPREHENSIVE METABOLIC PANEL
ALT: 18 U/L (ref 0–35)
Alkaline Phosphatase: 63 U/L (ref 39–117)
BUN: 15 mg/dL (ref 6–23)
CO2: 24 mEq/L (ref 19–32)
Calcium: 9.6 mg/dL (ref 8.4–10.5)
Chloride: 111 mEq/L (ref 96–112)
Creatinine, Ser: 0.71 mg/dL (ref 0.4–1.2)
Creatinine, Ser: 0.72 mg/dL (ref 0.4–1.2)
Glucose, Bld: 126 mg/dL — ABNORMAL HIGH (ref 70–99)
Potassium: 3.7 mEq/L (ref 3.5–5.1)
Total Protein: 6.7 g/dL (ref 6.0–8.3)

## 2011-02-21 LAB — URINALYSIS, ROUTINE W REFLEX MICROSCOPIC
Glucose, UA: NEGATIVE mg/dL
Hgb urine dipstick: NEGATIVE
Ketones, ur: NEGATIVE mg/dL
Protein, ur: NEGATIVE mg/dL

## 2011-02-21 LAB — CREATININE, SERUM
Creatinine, Ser: 0.65 mg/dL (ref 0.4–1.2)
Creatinine, Ser: 0.71 mg/dL (ref 0.4–1.2)
GFR calc non Af Amer: >60 mL/min
GFR calc non Af Amer: >60 mL/min (ref >60)

## 2011-02-21 LAB — CK TOTAL AND CKMB (NOT AT ARMC)
CK, MB: 1.8 ng/mL (ref 0.3–4.0)
Relative Index: INVALID (ref 0.0–2.5)
Total CK: 46 U/L (ref 7–177)
Total CK: 55 U/L (ref 7–177)

## 2011-02-21 LAB — MAGNESIUM
Magnesium: 2.5 mg/dL (ref 1.5–2.5)
Magnesium: 2.5 mg/dL (ref 1.5–2.5)

## 2011-02-21 LAB — POCT I-STAT 3, VENOUS BLOOD GAS (G3P V)
Acid-base deficit: 2 mmol/L (ref 0.0–2.0)
pH, Ven: 7.398 — ABNORMAL HIGH (ref 7.250–7.300)

## 2011-02-21 LAB — APTT
aPTT: 27 seconds (ref 24–37)
aPTT: 39 seconds — ABNORMAL HIGH (ref 24–37)

## 2011-02-21 LAB — BLOOD GAS, ARTERIAL
Acid-Base Excess: 0.8 mmol/L (ref 0.0–2.0)
Drawn by: 206361
O2 Saturation: 97.3 %
pO2, Arterial: 88.8 mmHg (ref 80.0–100.0)

## 2011-02-21 LAB — PROTIME-INR
INR: 1.02 (ref 0.00–1.49)
INR: 1.51 — ABNORMAL HIGH (ref 0.00–1.49)
Prothrombin Time: 18.1 seconds — ABNORMAL HIGH (ref 11.6–15.2)

## 2011-02-21 LAB — HEMOGLOBIN A1C
Hgb A1c MFr Bld: 6.2 % — ABNORMAL HIGH (ref 4.6–6.1)
Mean Plasma Glucose: 131 mg/dL

## 2011-02-21 LAB — HEMOGLOBIN AND HEMATOCRIT, BLOOD: HCT: 23.3 % — ABNORMAL LOW (ref 36.0–46.0)

## 2011-02-21 LAB — TROPONIN I
Troponin I: 0.02 ng/mL (ref 0.00–0.06)
Troponin I: 0.02 ng/mL (ref 0.00–0.06)

## 2011-02-25 LAB — BODY FLUID CELL COUNT WITH DIFFERENTIAL
Neutrophil Count, Fluid: 10 % (ref 0–25)
Total Nucleated Cell Count, Fluid: 5190 cu mm — ABNORMAL HIGH (ref 0–1000)

## 2011-02-25 LAB — PROTEIN, BODY FLUID

## 2011-02-25 LAB — GLUCOSE, SEROUS FLUID: Glucose, Fluid: 130 mg/dL

## 2011-02-25 LAB — BODY FLUID CULTURE
Culture: NO GROWTH
Gram Stain: NONE SEEN

## 2011-02-25 LAB — ANAEROBIC CULTURE

## 2011-03-19 LAB — BASIC METABOLIC PANEL
Calcium: 9.3 mg/dL (ref 8.4–10.5)
Creatinine, Ser: 0.77 mg/dL (ref 0.4–1.2)
GFR calc Af Amer: >60 mL/min (ref >60)
GFR calc non Af Amer: >60 mL/min (ref >60)
Sodium: 143 mEq/L (ref 135–145)

## 2011-03-19 LAB — CBC
Hemoglobin: 13.8 g/dL (ref 12.0–15.0)
RBC: 4.63 MIL/uL (ref 3.87–5.11)

## 2011-03-19 LAB — PROTIME-INR
INR: 0.9 (ref 0.00–1.49)
Prothrombin Time: 12.5 seconds (ref 11.6–15.2)

## 2011-04-17 NOTE — Consult Note (Signed)
NEW PATIENT CONSULTATION   Erica [name], KOEBEL C  DOB:  1941-10-03                                        January 17, 2010  CHART #:  [id number]   REASON FOR CONSULTATION:  Severe three-vessel coronary artery disease  with unstable angina.   CLINICAL HISTORY:  I was asked by Dr. Juanda Chance to evaluate the patient for  consideration of coronary artery bypass graft surgery.  She is a 70-year-  old woman with a history of coronary disease, status post  catheterization in January 2010 showing about 40% left main, 80 and 90%  LAD, 70% left circumflex, and 40% right coronary artery stenosis.  Coronary artery bypass graft surgery was recommended at that time, but  she refused and desired to continue medical therapy.  She had undergone  previous stenting of the right coronary artery with drug-eluting stents  in 2006 as well as a bare-metal stent to the right coronary artery in  1998.  She had a drug-eluting stent placed in the left circumflex in  2008.  She initially reported chest pain while ambulating in the airport  on her way to [facility].  She had recurrent symptoms during her trip out  to [facility] while ambulating and then had chest pain at rest later that  night.  She went to a local hospital in [facility] and was admitted where  she ruled in for a non-ST-segment elevation MI with troponin of 0.14 and  negative CPKs.  She signed out [facility] and returned from [facility] on  January 07, 2010.  She said that on January 08, 2010, she awoke with  chest pain around 4 a.m. and took a sublingual nitroglycerin.  Her chest  pain lasted about an hour and resolved.  Her symptoms returned while  later and resolved after about 15 minutes without further intervention.  She became concerned and came to the emergency room.  She underwent  cardiac catheterization on January 09, 2010 which showed three-vessel  coronary disease.  The left main had about 30% distal narrowing taper.  The  proximal LAD was heavily calcified and hazy with about 90% stenosis.  The mid vessel had about 50% stenosis.  The left circumflex was heavily  calcified proximally with 95% hazy stenosis.  The previous stented area  in the mid vessel had about 30% restenosis.  The right coronary artery  was heavily calcified as well with 50-60% ostial narrowing with heavy  calcification and dampening with the diagnostic catheter.  There was  slight haziness in the proximal portion of the stent in the mid right  coronary artery.  The posterior descending had about 50% proximal  narrowing.  Ejection fraction was normal.  There was no gradient across  the aortic valve and no mitral regurgitation.   REVIEW OF SYSTEMS:  As follows.  GENERAL:  She denies any fever or chills.  She has had no recent weight  changes.  She denies fatigue.  She said she has been going to the gym  and exercising without symptoms.  EYES:  Negative.  ENT:  Negative.  ENDOCRINE:  She denies diabetes and hypothyroidism.  CARDIOVASCULAR:  As above.  She has had recurrent episodes of chest  discomfort with ambulation as well as at rest.  She denies shortness of  breath with exertion or at rest.  She denies PND and orthopnea.  She  denies peripheral edema.  RESPIRATORY:  She denies cough and sputum production.  GI:  She has had no nausea or vomiting.  She denies melena and bright  red blood per rectum.  She does report some constipation.  GU:  She denies dysuria and hematuria.  VASCULAR:  She denies claudication and phlebitis.  NEUROLOGICAL:  She denies any focal weakness and numbness.  She denies  dizziness and syncope.  She has never had a TIA or stroke.  MUSCULOSKELETAL:  She denies arthralgias and myalgias.  HEMATOLOGICAL:  Negative.   ALLERGIES:  None.   MEDICATIONS:  1. Aspirin 81 mg daily.  2. Imdur 60 mg daily.  3. Sublingual nitroglycerin p.r.n.  4. Atenolol 25 mg daily.  5. Calcium plus vitamin D 600/400 daily.  6.  Synthroid 88 mcg daily.  7. Lipitor 40 mg daily.  8. Multivitamin daily.  9. Plavix 75 mg daily.  10.Vitamin C 500 mg daily.  11.Vitamin D3 1000 units daily.  12.WelChol 625 mg 3 in the morning and 3 in the evening.   PAST MEDICAL HISTORY:  Significant for coronary disease as mentioned  above.  She is status post bare-metal stent in the right coronary artery  in 1998 and then drug-eluting stent in the right coronary artery in 2006  and in the left circumflex in 2008.  She has a history of hypertension  and hyperlipidemia.  She has a history of hypothyroidism.  She had a  history of osteoarthritis.  She has undergone ankle surgery in the past.   FAMILY HISTORY:  Her mother died at 47 in an accident.  Her father, she  has no information about.  She was adopted and has no information on any  siblings.   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  She has a 30 pack-year smoking  history and quit about 14 years ago.  She denies alcohol and drug use.   PHYSICAL EXAMINATION:  Vital Signs:  Blood pressure is 112/64, pulse of  56 and regular, and respiratory rate is 16 unlabored.  Oxygen saturation  on room air is 98%.  General:  She is a well-developed white female, in  no distress.  HEENT:  Normocephalic and atraumatic.  Pupils are equal  and reactive to light and accommodation.  Extraocular muscles are  intact.  Her throat is clear.  Neck:  Normal carotid pulses bilaterally.  There are no bruits.  There is no adenopathy or thyromegaly.  Cardiac:  Regular rate and rhythm with normal S1 and S2.  There is no murmur, rub,  or gallop.  Lungs:  Clear.  Abdomen:  Active bowel sounds.  Abdomen is  soft and nontender.  There are no palpable masses or organomegaly.  Extremities:  No peripheral edema.  Pedal pulses are palpable  bilaterally.  Skin:  Warm and dry.  Neurologic:  Alert and oriented x3.  Motor and sensory exams are  grossly normal.   IMPRESSION:  The patient has high-grade proximal left anterior  descending coronary artery and left circumflex stenoses with moderate  ostial right coronary stenosis.  She has unstable anginal symptoms.  I  agree that coronary bypass graft surgery is indicated.  I told her I  would like to do that on Thursday of this week given her unstable  symptoms and the fact that she essentially has a high-grade left main  equivalent  stenosis.  She and her husband would like to wait until next  Wednesday because they said that their kids are out of town and she  would like to see them before surgery and she needs to get some of her  personal affairs in order.  I discussed the high-grade stenoses with  them as well as my concern that she is at high risk for further  ischemia, infarction, and death.  I told them that that could happen at  any time since she has had symptoms at rest.  They are adamant that they  would like to wait until next week and they said that they understand  the possibility that she may have further ischemia, infarction, and  death before surgery is performed.  I discussed the procedure in detail  with them including alternatives, benefits, and risks including, but not  limited to bleeding, blood transfusion, infection, stroke, myocardial  infarction, graft failure, and death.  They understand and agree to  proceed.  We will plan to do this next Wednesday, January 25, 2010.   Evelene Croon, M.D.  Electronically Signed   BB/MEDQ  D:  01/17/2010  T:  01/18/2010  Job:  [id number]   cc:   Everardo Beals. Juanda Chance, MD, [facility]  Donia Guiles, M.D.

## 2011-04-17 NOTE — Cardiovascular Report (Signed)
NAMEORVILLE, WIDMANN               ACCOUNT NO.:  [id number]   MEDICAL RECORD NO.:  [id number]          PATIENT TYPE:  OIB   LOCATION:  2899                         FACILITY:  [facility]   PHYSICIAN:  Everardo Beals. Juanda Chance, MD, FACCDATE OF BIRTH:  05-25-1941   DATE OF PROCEDURE:  12/08/2008  DATE OF DISCHARGE:                            CARDIAC CATHETERIZATION   CLINICAL HISTORY:  Ms. Bufano is 70 years old and has had multiple  previous percutaneous coronary interventions including rotational  atherectomy and overlapping drug-eluting stents in the right coronary  artery and a stent in the circumflex artery in July 2008.  She is  scheduled to have ankle surgery.  We did a preoperative Myoview scan on  her.  She exercises well and had no chest pain, but did have significant  ST segment depression despite the fact that she did not have any  abnormality on a Myoview scan.  Her scan before her circumflex lesion  which is quite a tight lesion, also did not cause ischemia, so I was  concerned and we brought her in for catheterization.   PROCEDURE:  The procedure was performed via the femoral artery using an  arterial sheath and 5-French preformed coronary catheters.  A femoral  arterial puncture was performed, and Omnipaque contrast was used.  After  completion of a diagnostic study,  made decision to evaluate the LAD  lesion with a pressure wire.  The patient was given weight-adjusted  heparin, prolonged ACT to greater than 200 seconds.  We used a Q-3.5  guiding catheter with outside holes.  We passed the pressure wire across  the lesion near the ostium of the LAD and gave 50 mg of adenosine on 3  separate rounds.  The fractional flow was 0.80, 0.79, and 0.80.  The  wire and guiding catheter were then removed.  The right femoral artery  was closed with Angio-Seal at the end of the procedure.  The patient  tolerated procedure well and left the laboratory in satisfactory  condition.   RESULTS:  The  artery pressure was 146/40 with mean of 89.  The left  index pressure was 146/12.   Left main coronary artery:  The main coronary had a 40% ostial stenosis  and there was some calcification.   Left anterior descending artery:  The left descending artery gave rise  to 2 small diagonal branches, 2 septal perforators and a third diagonal  branch.  The LAD was heavily calcified.  There was an 80 to questionably  90% stenosis near the ostium of the LAD with heavy calcification.  There  was 50% narrowing in the mid LAD.   Circumflex artery:  The circumflex artery gave rise to a large marginal  branch and a small AV branch.  There was 70% narrowing at the ostium and  circumflex artery.  There was less than 10% narrowing at the stent site  in the marginal branch.   Right coronary artery:  The right coronary artery was a moderate-sized  vessel that gave rise to 2 right ventricular branches, the posterior  descending branch, and 3 posterolateral branches.  There was 40%  narrowing in the proximal vessel.  There were 2 overlapping stents in  the mid vessel with 30% focal narrowing in the mid portion of the 2  stents.  There were irregularities and calcium throughout the vessel,  but no other significant obstruction.   Left ventriculogram:  The left ventriculogram performed in the RAO  projection showed good wall motion but no areas of hypokinesis.   The fractional flow across the LAD was 0.80.   CONCLUSION:  1. Coronary artery disease, status post prior percutaneous coronary      interventions with 40% ostial stenosis in the left main, 80-90%      stenosis near the ostium of the LAD, 70% ostial stenosis and      circumflex artery was less than 10% stenosis at the stent site in      the marginal branch of the circumflex artery, 40% narrowing in the      proximal and ostial portion of the right coronary with 30%      narrowing within the overlapping stents in the mid right coronary      and  normal LV function.  2. Fractional flow was 0.80 across the left anterior descending      artery.   RECOMMENDATIONS:  Based on the SSR and looking at the anatomy, I think  the LAD lesion is significant and fairly tight.  The lesion is not  favorable for PCI.  The LAD actually expanded to the ostium and this  would encroach on the ostial disease in the circumflex artery.  There is  also heavy calcification which would make percutaneous intervention more  difficult.  I think surgery is the best option and I will discuss this  with the patient.      Bruce Elvera Lennox Juanda Chance, MD, [facility]  Electronically Signed     BRB/MEDQ  D:  12/08/2008  T:  12/09/2008  Job:  [id number]   cc:   Donia Guiles, M.D.

## 2011-04-17 NOTE — Assessment & Plan Note (Signed)
[facility]                            CARDIOLOGY OFFICE NOTE   NAME:Erica [name], Erica [name]                      MRN:          [id number]  DATE:12/20/2008                            DOB:          September 13, 1941    PRIMARY CARE PHYSICIAN:  Donia Guiles, MD.    INCOMPLETE DICTATION     Everardo Beals. Juanda Chance, MD, [facility]     BRB/MedQ  DD: 12/20/2008  DT: 12/21/2008  Job #: [id number]

## 2011-04-17 NOTE — Assessment & Plan Note (Signed)
OFFICE VISIT   Erica [name], Erica [name]  DOB:  [birth year]                                        February 27, 2010  CHART #:  [id number]   HISTORY:  The patient comes in today for a 1-week followup.  She is  status post CABG x4 on January 25, 2010.  She was seen last week in our  office for a routine postoperative check and was noted to have moderate  left pleural effusion.  She was stable and it was elected to treat her  with a 10-day course of Lasix and potassium.  She was instructed to  follow up this week with a repeat chest x-ray.  In the interim, she had  a regularly scheduled appointment with Dr. Juanda Chance on February 24, 2010 and  at that visit, it was noted to have worsening of her symptoms of  shortness of breath and chest discomfort.  Dr. Juanda Chance elected to send  her for an ultrasound-guided thoracentesis and the patient stated that  immediately following her thoracentesis, her symptoms improved  dramatically.  She has since continued on the Lasix and has 3-4 days  more before her course is complete.  Overall,  her symptoms of shortness  of breath have resolved and she is doing well at this point.   PHYSICAL EXAMINATION:  Vital Signs:  Blood pressure is 108/63, pulse is  56, respirations 18, and O2 sat 99% on room air.  Her surgical incisions  are all healing well.  Heart:  Regular rate and rhythm without murmurs,  rubs, or gallops.  Chest:  Sternum is stable to palpation.  Lungs:  Clear.  Extremities:  There is no lower extremity edema.   DIAGNOSTIC TESTS:  Chest x-ray shows almost complete resolution of her  left pleural effusion.   ASSESSMENT/PLAN:  The patient is doing well status post coronary artery  bypass graft.  Her pleural effusion has almost completely resolved  following thoracentesis.  Since she has 3-4 more days' worth of the  Lasix left, I have instructed her to finish out the complete course and  hopefully she will have a recurrence.  She was  scheduled to see Dr.  Juanda Chance back in about 3 weeks for recheck.  At this point, we will  discharge her from our care.  She has already began  driving and plans to proceed with cardiac rehab.  We will have her call  if she experiences any surgically-related problems in the interim.   Evelene Croon, M.D.  Electronically Signed   GC/MEDQ  D:  02/27/2010  T:  02/28/2010  Job:  [id number]   cc:   Everardo Beals. Juanda Chance, MD, [facility]

## 2011-04-17 NOTE — Assessment & Plan Note (Signed)
HEALTHCARE                            CARDIOLOGY OFFICE NOTE   NAME:Erica [name], [name]                      MRN:          [id number]  DATE:07/08/2007                            DOB:          1941-01-08    PRIMARY CARDIOLOGIST:  Dr. Charlies Constable.   PRIMARY CARE Taylen Wendland:  Dr. Donia Guiles.   PATIENT PROFILE:  A 70 year old Caucasian female with prior history of  CAD with recent PCI and stenting of the left circumflex with a Endeavor  drug-eluting stent, who presents for hospital followup.   PROBLEM LIST:  1. Coronary artery disease.      a.     August 1997, status post percutaneous coronary intervention       of the right coronary artery.      b.     May 14, 1997, status post Rotablator angioplasty to the       right coronary artery.      c.     June 21, 1997, status post percutaneous coronary       intervention and stenting of the distal right coronary artery with       placement of a radius bare-metal self-expanding stent.      d.     May 2,2006, status post percutaneous coronary intervention       and stenting of the right coronary artery with overlapping Taxus       drug-eluting stent x2.      e.     May 27, 2007, exercise Myoview, exercise time 11 minutes 30       seconds, no significant ST-T changes.  There was a hypotensive       response in recovery.  Perfusion imaging revealed no evidence of       ischemia or infarct.  Recommended followup secondary to       hypotension post exercise.      f.     June 16, 2007, cardiac catheterization revealing left main       20%; left anterior descending 40% proximal; first diagonal 50%;       left circumflex, 50% ostial, 90% mid; posterolateral branch, 70%       ostial in the smaller branch; right coronary artery, 40% ostial,       30% distal.  Ejection fraction 60%.  The left circumflex was       successfully stented with a 2.5 x 14-mm Endeavor stent.  2. Hypertension.  3. Hyperlipidemia.  4.  Hypothyroidism.  5. History of asymptomatic bradycardia.  6. History of melena, pending colonoscopy.   HISTORY OF PRESENT ILLNESS:  A 70 year old Caucasian female last seen in  clinic on June 16; at that time, she was pending foot surgery and  underwent cardiac clearance with a Myoview.  Although perfusion imaging  was normal, she did have hypotension post exercise and it was felt that  she should undergo cardiac catheterization, which subsequently took  place on July 14, revealing a 90% lesion in the left circumflex, which  was successfully stented with an Endeavor drug-eluting stent.  Since  discharge, she has  done well without any chest pain, palpitations or  shortness of breath.  She has been exercising about once a week with  weights and walking about 15 minutes daily without any symptoms or  limitations.  Notably, she is pending colonoscopy in September and per  her GI doctors, they plan to do this, despite being on Coumadin.   HOME MEDICATIONS:  1. Atenolol 25 mg daily.  2. Aspirin 325 mg daily.  3. Multivitamin daily.  4. Plavix 75 mg daily.  5. Lipitor 40 mg daily.  6. Levothyroxine 100 mcg daily.  7. Welchol 625 mg three tabs b.i.d.  8. Doxycycline 100 mg quarter of a tablet daily.  9. Nitroglycerin sublingual p.r.n. chest pain.   PHYSICAL EXAMINATION:  Blood pressure 126/71, heart rate 57,  respirations 16.  Weight is 135 pounds.  A pleasant white female in no acute distress, alert and oriented x3.  NECK:  No bruits or JVD.  LUNGS:  Respirations regular and unlabored, clear to auscultation.  CARDIAC:  Regular S1 and S2.  No S3, S4 or murmurs.  ABDOMEN:  Round, soft, nontender and non-distended.  Bowel sounds are  present x4.  EXTREMITIES:  Warm, dry and pink.  No clubbing, cyanosis, or edema.  Dorsalis pedis and posterior tibial pulses 2+ and equal bilaterally.  GU:  The right groin site which was used for cath is clear of bleeding,  bruising or hematoma.    ACCESSORY CLINICAL FINDINGS:  EKG shows sinus bradycardia without acute  ST changes.   ASSESSMENT AND PLAN:  1. Coronary artery disease.  The patient is recently status post      percutaneous coronary intervention and stenting of the left      circumflex with an Endeavor drug-eluting stent.  She will remain on      Plavix for at least 1 year.  She is doing well without any symptoms      and continued on aspirin, beta blocker and statin therapy as well.  2. Hypertension, well-controlled.  Continue current regimen.  3. Hyperlipidemia.  She takes both Lipitor and Welchol and is      tolerating these well.  4. Disposition:  The patient will follow up with Dr. Juanda Chance in 6      months or sooner if necessary.     Nicolasa Ducking, ANP  Electronically Signed      Madolyn Frieze. Jens Som, MD, [facility]  Electronically Signed   CB/MedQ  DD: 07/08/2007  DT: 07/09/2007  Job #: [id number]

## 2011-04-17 NOTE — Assessment & Plan Note (Signed)
[facility]                            CARDIOLOGY OFFICE NOTE   NAME:STANLEYKorina, Tretter                      MRN:          [id number]  DATE:12/20/2008                            DOB:          04-30-1941    PRIMARY CARE PHYSICIAN:  Donia Guiles, MD   ORTHOPEDIC SURGEON:  Leonides Grills, MD   CLINICAL HISTORY:  Ms. Lysne is 70 years old and returned for followup  visit after her recent catheterization.  She has had bare-metal stent  placed in the right coronary artery in 1988 and in 2006 had 2  overlapping drug-eluting stents placed in the right coronary artery  following rotational atherectomy.  In July 2008, Endeavor stent placed  in the circumflex artery.  Recently, she had a stress test for a preop  evaluation prior to foot surgery and she had ST-T changes, although she  did not have any change on a perfusion scan.  With the EKG change, we  brought her in for catheterization, then we found an 80% stenosis in the  proximal LAD.  There is also involvement of the ostium of the LAD and  also diagonal branch of the LAD.  We did a fractional flow reserve and  this was 0.80 and we felt the lesion was significant.  She also had 70%  ostial stenosis in the circumflex artery.  There was 0% stenosis at the  stent site in the marginal branch of circumflex artery.  Her right  coronary artery only had 30-40% narrowing.  We recommended surgery, but  she was unsure she wanted to have that done and went to think about it  some more.  She has not had any chest pain.   PAST MEDICAL HISTORY:  Significant for hypertension and hyperlipidemia.  She also has problems with both feet.  She has had the right foot  operated on and was contemplating surgery on the left foot.  She had  difficulty walking on a treadmill, but she uses a StairMaster Elliptical  without too much difficulty when she works out.   CURRENT MEDICATIONS:  1. Aspirin.  2. Calcium.  3. Atenolol 25 mg  daily.  4. Plavix 75 mg daily.  5. Lipitor 40 mg daily.  6. WelChol.  7. Levothyroxine.  8. Vitamin D.  9. Imdur 30 mg daily.   PHYSICAL EXAMINATION:  VITAL SIGNS:  Blood pressure 113/62 and the pulse  is 55 and regular.  NECK:  There is no vein distention.  The carotid pulses were full  without bruits.  CHEST:  Clear.  CARDIAC:  Rhythm was regular.  No murmurs or gallops.  ABDOMEN:  Soft with normal bowel sounds.  EXTREMITIES:  Peripheral pulses were full with no peripheral edema.  The  right femoral site was well healed.   Electrocardiogram was normal.   IMPRESSION:  1. Coronary artery disease, status post multiple percutaneous      interventions as described above with progression of disease in the      left anterior descending and diagonal branch with 80% narrowing in      the left anterior descending (  bifurcation lesion), 70% narrowing in      the circumflex artery and 40% narrowing in the right coronary      artery.  2. Good left ventricular function.  3. Hypertension.  4. Hyperlipidemia.  5. Status post prior ankle surgery.   RECOMMENDATIONS:  Ms. Falor is still undecided about surgery and she  still likes to think about it further.  We spent some time talking about  the pros and cons and also her limitations with some medical therapy.  She works out at [employer] and I told her that probably a heart rate of  about 100, should not exceed a heart rate of 105 to 110 to try and avoid  ischemia.  I think we will need to postpone her ankle surgery for the  present since I think her cardiac risk will be significantly increased.  I will plan to see her back in about 3 months.  She also asked if she  could talk to a woman who has had prior bypass surgery and we will see  if we can accommodate that.     Bruce Elvera Lennox Juanda Chance, MD, [facility]  Electronically Signed    BRB/MedQ  DD: 12/20/2008  DT: 12/21/2008  Job #: [id number]

## 2011-04-17 NOTE — Cardiovascular Report (Signed)
NAMEFREDI, HURTADO               ACCOUNT NO.:  [id number]   MEDICAL RECORD NO.:  [id number]          PATIENT TYPE:  OIB   LOCATION:  1961                         FACILITY:  [facility]   PHYSICIAN:  Everardo Beals. Juanda Chance, MD, FACCDATE OF BIRTH:  [birth year]   DATE OF PROCEDURE:  DATE OF DISCHARGE:  06/16/2007                            CARDIAC CATHETERIZATION   CLINICAL HISTORY:  Ms. Livengood is 70 years old and had previous  interventions on her right coronary artery.  The last of which were  tandem overlapping stents in the mid right coronary artery.  She has  been doing and had no recent symptoms, although she has been much less  active because of foot problems, for which she is contemplating surgery.  She saw me in consultation recently for a preoperative evaluation and we  did an adenosine Myoview preoperative consultation.  The patient was  able to exercise despite her foot problems and developed no chest pain,  but she did develop significant ST-T changes and a hypotensive response  to exercise.  She has no perfusion defect.  Because of the hypotensive  response to exercise and ST-T changes, we brought her in for evaluation  of the angiography in the outpatient laboratory.   PROCEDURE:  The procedure was performed with a right femoral artery  arterial sheath and 4-French preformed coronary catheters.  A front wall  arterial puncture was performed and Omnipaque  contrast was used.  Patient tolerated well and left the laboratory in satisfactory  condition.   RESULTS:  The aortic pressure was 158/62 with a mean of 98 and left  ventricular pressure was 158/13.   The left main coronaryhad a 20% distal stenosis.   The left anterior descending artery was heavily calcified with 40%  narrowing in the proximal vessel.  The LAD gave rise to 2 diagonal  branches and a septal perforated.  The LAD was irregular and there was  heavy calcification.  There was 50% ostial stenosis in the diagonal  branch, but no major obstruction in the mid and distal LAD.   The circumflex artery gave rise to an atrial branch and 2 posterolateral  branches.  It also gave rise to a small marginal branch.  There was 50%  ostial stenosis in the circumflex.  There was 90% stenosis in the mid  vessel just before bifurcation into 2 posterolateral branches; one of  which was small.  There was 70% ostial stenosis of the smaller  posterolateral branch.   The right coronary is a moderate sized vessel gave rise to 2 right  ventricular branches, a posterior descending branch and 2 posterolateral  branches.  There was 40% ostial narrowing in the right coronary artery.  There was 30% narrowing, which was fairly focal in the distal of the 2  overlapping stents in the mid-vessel.   The left ventriculogram performed in RAO projection showed good wall  motion with no areas of hypokinesis.  The estimated ejection fraction  was 60%.   CONCLUSION:  Coronary artery disease with 20% narrowing of the distal  left main coronary artery, 40% narrowing in the proximal  LAD with 50%  ostial stenosis in the largest diagonal branch, 50% ostial stenosis in  the circumflex artery with 90% stenosis in the mid-vessel, 40% ostial  stenosis in the right coronary artery and 30% focal stenosis mid-right  coronary artery within the stent and normal LV function.   RECOMMENDATIONS:  Patient has had progression of disease in the  circumflex artery with a tight mid-circumflex stenosis.  I think she  will require intervention for this and we will plan to do this later  today.  I have talked to her about her foot surgery and decide about the  pros and cons of a drug-eluting stent versus a bare metal stent.  If her  surgery can be postponed beyond 6 months, then we will probably consider  a drug-eluting stent.      Bruce Elvera Lennox Juanda Chance, MD, [facility]  Electronically Signed     BRB/MEDQ  D:  06/16/2007  T:  06/16/2007  Job:  [id number]   cc:    ?  Leonides Grills, M.D.

## 2011-04-17 NOTE — Assessment & Plan Note (Signed)
[facility]                            CARDIOLOGY OFFICE NOTE   NAME:Erica [name], Erica [name]                      MRN:          [id number]  DATE:08/18/2008                            DOB:          18-Oct-1941    PRIMARY CARE PHYSICIAN:  Donia Guiles, MD   REFERRING PHYSICIAN:  Kristine Garbe. Ezzard Standing, MD   REASON FOR CONSULTATION:  Preop evaluation prior to lymph node  biopsy/removal.   CLINICAL HISTORY:  Erica [name] is 14 years and is being seen today prior  to lymph node biopsy by Dr. Narda Bonds.  She was found to have  submandibular lymph node on exam and this did not resolve after  treatment with antibiotics and a decision was made to remove this.   She has known coronary artery disease and had a bare-metal stent placed  in the right coronary in 1988 and 2006.  She had 2 overlapping drug-  eluting stents placed in the mid-right coronary artery following  rotational atherectomy.  In July 2008, she had an endeavor stent placed  in the circumflex artery.  This is a 2.5 x 15-mm stent dilated with a  2.75 balloon.  This was the only drug-eluting stent that could get down  to the lesion.   In February, she had surgery on her ankle, we kept her off of Plavix for  5 days.  She had some shortness of breath.  After that we did a D-dimer  and a chest x-ray and oxygen saturations to rule out pulmonary embolus,  and these were negative.   She has been doing quite well recently from a standpoint of her heart  and has had no chest pain, shortness breath, or palpitations.   PAST MEDICAL HISTORY:  Significant for hypertension and hyperlipidemia.   CURRENT MEDICATIONS:  Aspirin, calcium, atenolol 25 mg daily, Plavix 75  mg daily, Lipitor 40 mg daily, levothyroxine, and WelChol.   SOCIAL HISTORY:  She lives with her husband.  She does not smoke.  She  exercises regularly on an elliptical trainer 30 minutes 5 days a week.   FAMILY HISTORY:  Noncontributory.   REVIEW OF SYSTEMS:  Negative except for some arthralgias.   PHYSICAL EXAMINATION:  VITAL SIGNS:  Blood pressure is 140/82 and the  pulse 56 and regular.  NECK:  There was no venous distention.  The carotid pulses were full  without bruits.  CHEST:  Clear without rales or rhonchi.  CARDIAC:  Rhythm was regular.  The heart sounds were normal and no  murmurs or gallops.  ABDOMEN:  Soft with normal bowel sounds.  There was  no hepatosplenomegaly.  EXTREMITIES:  Peripheral pulse were full with no peripheral edema.  MUSCULOSKELETAL:  Showed no deformities.  SKIN:  Warm and dry.  NEUROLOGIC:  No focal neurologic deficits.   IMPRESSION:  1. Preoperative evaluation prior to lymph node biopsy.  2. Coronary artery disease status post placement of a bare-metal stent      followed by 2 overlapping drug-eluting stents in the right coronary      and more recently in 2008 an endeavor drug-eluting  stent to the      circumflex artery, now stable.  3. Good left ventricular function.  4. Hypertension.  5. Hyperlipidemia.  6. Status post prior ankle surgery.   RECOMMENDATIONS:  I think Erica [name] is stable from a cardiac  standpoint to have biopsy under local anesthesia.  I also think she is  more than a year out from her last drug-eluting stent and I think it is  okay for her to come off of the Plavix for 3 days prior to procedure and  the day afterwards.  She is scheduled to have foot surgery in January  under general anesthesia and I think before this they would like to  screen her with an adenosine rest exercise Myoview scan.  We will plan  to see her in January in anticipation of that prior to surgery in  January.     Bruce Elvera Lennox Juanda Chance, MD, [facility]  Electronically Signed    BRB/MedQ  DD: 08/18/2008  DT: 08/19/2008  Job #: [id number]   cc:   Donia Guiles, M.D.  Kristine Garbe. Ezzard Standing, M.D.

## 2011-04-17 NOTE — Assessment & Plan Note (Signed)
HEALTHCARE                            CARDIOLOGY OFFICE NOTE   NAME:Erica [name], Erica                      MRN:          [id number]  DATE:02/27/2008                            DOB:          [birth year]    PRIMARY CARE PHYSICIAN:  Donia Guiles, M.D.   CLINICAL HISTORY:  Erica [name] for management of Erica [name] coronary  heart disease.  Erica [name] has had previous rotational atherectomy and  placement of a previous bare metal stent in 1998 and in 2006 Erica [name] had  stenting of the right with placement of two overlapping stents in the  mid right coronary artery.  Last July Erica [name] had an Endeavor stent placed  in the circumflex artery.  This was a 2.5 x 15 mm stent post dilated  with a 2.75 balloon.  We were unable to get a Taxus stent down to the  lesion.   In February Erica [name] had surgery on Erica [name] right ankle.  We allowed Erica [name] to stop  Erica [name] Plavix for 5 days prior to this.  Not too long after that Erica [name] says  Erica [name] developed symptoms of shortness of breath.  Erica [name] says Erica [name] gets short  of breath at rest and sometimes with exertion although it is not  predictably related to exertion.  This may occur every other day  although Erica [name] has not had any symptoms for about a week now.  Erica [name] has had  no chest pain associated with this and no cough and Erica [name] has had no  swelling in Erica [name] legs.   PAST MEDICAL HISTORY:  Significant for hypertension, hyperlipidemia.   CURRENT MEDICATIONS:  1. Include atenolol 25 mg daily.  2. Plavix.  3. Lipitor 40 mg a day.  4. Levothyroxine.  5. Welchol.  6. Doxycycline for a gum infection.  7. Aspirin.  8. Fosamax.   PHYSICAL EXAMINATION:  VITAL SIGNS:  On examination the blood pressure  is 124/73 and the pulse 58 and regular.  NECK:  There was no venous distention.  The carotid pulses were full  without bruits.  CHEST:  Was clear.  HEART:  Rhythm was regular.  No murmurs, rubs or gallops.  ABDOMEN:  Soft without organomegaly.  EXTREMITIES:  Erica [name] had  a cast on Erica [name] right foot.  There is no edema right  above the cast and there was no edema in the left lower extremity.   Electrocardiogram showed minor nonspecific ST-T changes.   IMPRESSION:  1. Shortness of breath of uncertain etiology, rule out pulmonary      embolism.  2. Status post recent ankle surgery.  3. Coronary artery disease status post multiple percutaneous      interventions as described above, the last of which was an Endeavor      stent placed in the circumflex artery July of 2008.  4. Hypertension.  5. Hyperlipidemia.   RECOMMENDATIONS:  My biggest concern is that these symptoms could  represent a pulmonary embolism postoperative from Erica [name] ankle surgery.  We  will plan to get a chest x-ray and an O2 sat and a D-dimer.  If any of  these are  abnormal then we will plan to pursue this further probably  with CT angio.  If they are all negative then I think we could give this  some time and see if Erica [name] symptoms improve as Erica [name] becomes more active.  I  will plan to see Erica [name] back in 6 months but if Erica [name] symptoms worsen will  see Erica [name] sooner.     Bruce Elvera Lennox Juanda Chance, MD, [facility]  Electronically Signed    BRB/MedQ  DD: 02/27/2008  DT: 02/28/2008  Job #: [id number]   cc:   Donia Guiles, M.D.

## 2011-04-17 NOTE — Cardiovascular Report (Signed)
NAMEPAILYN, Erica [name]               ACCOUNT NO.:  [id number]   MEDICAL RECORD NO.:  [id number]          PATIENT TYPE:  INP   LOCATION:  6533                         FACILITY:  [facility]   PHYSICIAN:  Everardo Beals. Juanda Chance, MD, FACCDATE OF BIRTH:  [birth year]   DATE OF PROCEDURE:  06/16/2007  DATE OF DISCHARGE:                            CARDIAC CATHETERIZATION   PROCEDURE PERFORMED:  Percutaneous coronary intervention.   CLINICAL HISTORY:  Erica [name] is 70 years old and has had previous  percutaneous coronary interventions, the last of which was 2 overlapping  Taxus stents in the mid-right coronary artery.  She recently developed a  foot problem, and I saw her for a preoperative evaluation prior to foot  surgery.  We did an exercise Myoview scan and she developed ST-T changes  and dropped her blood pressure, although she did not have a defect on  the scan.  Because of this, we brought her in for a catheterization in  the outpatient laboratory, which was done this morning and showed a 90%  lesion in the mid circumflex artery.  We brought her upstairs today for  intervention.  She indicated that she could postpone the foot surgery  for 6 months, giving [place] the options to treat her with a drug-eluting  stent.   PROCEDURE:  The procedure was followed via right femoral artery and  arterial sheath, after exchanging the old arterial sheath for a new 6-  French arterial sheath.  We initially tried a 6-French CLS 3.5 guiding  catheter with side holes, but this did not fit properly; so we changed  out for a CLS 3.0 guiding catheter without side holes.  The patient was  given Angiomax bolus infusion and had previously been given a Plavix  load and chewable aspirin.  We navigated a Prowater wire down the  circumflex artery and across the lesion without too much difficulty.  We  then predilated with a 2.25 x 20-mm Maverick, performing 3 inflations up  to 10 atmospheres for 30 seconds.  We then attempted to  pass a 2.5 x 60  mm Taxus stent, but was unable to pass the stent into the circumflex  artery and around the bend from the left main.  We used a PT-2 light  support buddy wire and still were unable to pass the Taxus stent.  We  then changed for a 2.5 x 14 mm Endeavor stent, and with the help of the  buddy wire we were able to pass this stent down to the lesion.  We  removed the buddy wire and then deployed the stent with one inflation of  10 atmospheres for 30 seconds.  We then postdilated the stent with a  2.75 x 12-mm Quantum Maverick, performing one inflation up to 15  atmospheres for 30 seconds.  Final diagnostic studies were then  performed with the guiding catheter.  The patient tolerated the  procedure well and left the laboratory in satisfactory condition.   RESULTS:  Initially stenosis in the mid circumflex was estimated at 90%.  Following stenting this improved to 0%.  There was a side branch, which  was a posterolateral branch, across which the stent jailed.  This had  about a 70% lesion initially, and this increased to 90%; but the flow  remained TIMI III flow.   CONCLUSION:  Successful PCI of the lesion in the mid circumflex artery  using an Endeavor drug-eluting stent; with improvement in luminal  narrowing from 90% to 0%.   DISPOSITION:  The patient was admitted for further observation.  Will  plan at least 6 months of Plavix before interrupting this for foot  surgery.      Bruce Elvera Lennox Juanda Chance, MD, [facility]  Electronically Signed     BRB/MEDQ  D:  06/16/2007  T:  06/17/2007  Job:  [id number]   cc:   Donia Guiles, M.D.

## 2011-04-17 NOTE — Discharge Summary (Signed)
NAMEGRACYN, [name]               ACCOUNT NO.:  [id number]   MEDICAL RECORD NO.:  [id number]          PATIENT TYPE:  INP   LOCATION:  6533                         FACILITY:  [facility]   PHYSICIAN:  Everardo Beals. Juanda Chance, MD, FACCDATE OF BIRTH:  02/06/1941   DATE OF ADMISSION:  06/16/2007  DATE OF DISCHARGE:  06/17/2007                               DISCHARGE SUMMARY   PROCEDURE:  1. Cardiac catheterization.  2. Coronary arteriogram.  3. Left ventriculogram.  4. PTCA and Endeavor drug-eluting stent to 1 vessel.  5. Epi and Lidocaine injection to the right groin.   PRIMARY DISCHARGE DIAGNOSIS:  Unstable anginal pain, status post PTCA  and stent to circumflex this admission.   SECONDARY DIAGNOSIS:  1. Status post rotational atherectomy and stent to the mid RCA      remotely  2. Status post PTCA and Taxus stent to the distal RCA in May 2006  3. Status post colonoscopy  4. Asymptomatic bradycardia.  5. Hypertension.  6. Hyperlipidemia.  7. Hypothyroidism.   Time of discharge 34 minutes.   HOSPITAL COURSE:  [name] is a 70 year old female with a previous  history of coronary artery disease.  She has previously had stents  placed in the RCA with preserved left ventricular function.  She was  having exertional chest pain and needed foot surgery, so Dr. Juanda Chance felt  cardiac catheterization was indicated and she was admitted to the  hospital for this on 06/16/2007.   The cardiac catheterization showed a 90% circumflex lesion which was  treated with an Endeavor stent reducing the stenosis to zero.  Noncritical coronary artery disease in the diagonal at 50%, circumflex  50%, OM-3 70%, RCA proximal 40% and 30% in-stent restenosis is to be  treated medically.  Her EF was 60%.   Postprocedure her groin was oozing, but she was injected with  epinephrine and lidocaine and it resolved.   On 06/17/2007 [name] was ambulating without chest pain or shortness  of breath.  She was evaluated  by Dr. Juanda Chance and considered stable for  discharge with outpatient follow-up arranged.   DISCHARGE INSTRUCTIONS:  Her activity level is to be increased  gradually.  She is not to do any lifting for a week and no driving for 2  days.  She is to stick to a low sodium heart healthy diet that is low in  sugars as well.  She is to follow up with Dr. Juanda Chance on August 5 at 3:15  and she is to call Dr. Arvilla Market for an appointment because of a fasting  blood sugar of 119 and a blood sugar that was possibly fasting of 124.   DISCHARGE MEDICATIONS:  1. Atenolol 25 mg daily  2. Vicodin and vitamins as prior to admission.  3. Welchol 625 3 tablets b.i.d.  4. Plavix 75 mg daily  5. Lipitor 40 mg q. day.  6. Levothyroxine 100 mcg daily  7. Aspirin 325 mg daily.  8. Nitroglycerin sublingual p.r.n.  9. Doxycycline 100 mg 1/4 tab q. day.      Theodore Demark, PA-C  Bruce Elvera Lennox Juanda Chance, MD, [facility]  Electronically Signed    RB/MEDQ  D:  06/17/2007  T:  06/17/2007  Job:  [id number]   cc:   Donia Guiles, M.D.

## 2011-04-17 NOTE — Assessment & Plan Note (Signed)
[facility]                            CARDIOLOGY OFFICE NOTE   NAME:STANLEYJosalyn, Dettmann                      MRN:          [id number]  DATE:11/03/2008                            DOB:          [birth year]    PRIMARY CARE PHYSICIAN:  Donia Guiles, MD   ORTHOPEDICS:  Leonides Grills, MD   CLINICAL HISTORY:  Ms. Devincentis is 70 years old and returned for followup  management of coronary heart disease.  She had the bare-metal stents  placed in the right coronary artery in 1988 and 2006, and then she had 2  overlapping drug-eluting stents placed in the mid-right coronary artery  following rotational atherectomy.  In July 2008, she had an Endeavor  stent placed in the circumflex artery.  This is a 2.5 x 15-mm stent  dilated with a 2.75 balloon.  We could not get other stent down into the  lesion.   She states she had been doing quite well.  She has an ankle problem,  which restricts her activity a little bit, but she is unable to walk on  a StairMaster and feels like she has been keeping up her exercise  tolerance pretty well.  She has had no chest pain, shortness of breath,  or palpitations.   PAST MEDICAL HISTORY:  Significant for hypertension and hyperlipidemia.   CURRENT MEDICATIONS:  1. Aspirin.  2. Atenolol 25 mg daily.  3. Plavix.  4. Lipitor 40 mg daily.  5. WelChol.  6. Levothyroxine.  7. Vitamin D.   SOCIAL HISTORY:  She lives with her husband.  She does not smoke.  She  is exercising regularly.  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   PHYSICAL EXAMINATION:  VITAL SIGNS:  Blood pressure 118/63 and pulse 54  and regular.  NECK:  There was no venous distention.  The carotid pulses were full  without bruits.  CHEST:  Clear.  CARDIAC:  Rhythm was regular.  I could hear no murmurs or gallops.  ABDOMEN:  Soft with normal bowel sounds.  EXTREMITIES:  Peripheral pulses were full with no peripheral edema.   IMPRESSION:  1. Coronary  artery disease, status post percutaneous coronary      intervention of the right coronary artery with a bare-metal stent      followed by 2 overlapping drug-eluting stents and subsequently a      drug-eluting stent in the circumflex artery in 2008, and is stable.  2. Good left ventricular function.  3. Hypertension.  4. Hyperlipidemia.  5. Status post prior ankle surgery.   RECOMMENDATIONS:  I think Ms. Ewton is stable from a cardiac  standpoint.  She will need repeat ankle surgery in January under general  anesthesia by Dr. Lestine Box.  I will plan to evaluate her with an exercise  rest/stress Myoview scan prior to that surgery.  We will plan and do  that in the next couple of weeks.  I will be in touch with her by phone.  If this is negative and she does well over the surgery, we will see her  back in a year.  Bruce Elvera Lennox Juanda Chance, MD, [facility]  Electronically Signed    BRB/MedQ  DD: 11/03/2008  DT: 11/04/2008  Job #: [id number]   cc:   Donia Guiles, M.D.  Leonides Grills, M.D.

## 2011-04-17 NOTE — Assessment & Plan Note (Signed)
OFFICE VISIT   Erica [name], Erica [name]  DOB:  25-Dec-1940                                        February 20, 2010  CHART #:  [id number]   REASON FOR OFFICE VISIT:  Routine follow up status post CABG x4.   HISTORY OF PRESENTING ILLNESS:  This is a 70 year old Caucasian female  with a past medical history of hypertension and hyperlipidemia who was  found to have multivessel coronary artery disease.  She underwent a CABG  x4 (LIMA to LAD, sequential saphenous vein graft to the posterior  descending and posterolateral branch of the RCA, and SVG to OM branch of  the circumflex artery by Dr. Laneta Simmers on January 25, 2010).  The patient  had a fairly uneventful postoperative course.  She was discharged on  January 29, 2010.  Since having been discharged from the hospital, the  patient notes her only complaints include occasional fatigue and recent  onset of increasing shortness of breath with exertion as well as pain  upon deep inspiration posterior left chest base.  The patient currently  denies any chest pain, fever, or chills.   PHYSICAL EXAMINATION:  General:  This is a pleasant 70 year old  Caucasian female who is in no acute distress who is alert, oriented, and  cooperative.  Vital Signs:  BP is 109/50, heart rate 56, respiration  rate 18, O2 sat 95% on room air.  Cardiovascular:  Regular rate and  rhythm with S1 and S2 without any murmurs, gallops, or rubs.  Pulmonary:  Right lung is clear to auscultation bilaterally.  The left lung apex is  clear.  Decreased breath sounds at the left base.  Abdomen:  Soft,  nontender.  Bowel sounds present.  Extremities:  No cyanosis, clubbing.  Trace lower extremity edema.  Right lower extremity wound as well as  sternum well healed.  No signs of infection.  Sternum is solid.   DIAGNOSTIC TESTS:  Chest x-ray done today shows a moderate left pleural  effusion and atelectasis.  No pneumothorax.  Right lung is clear.   IMPRESSION  AND PLAN:  Overall, the patient is surgically stable status  post coronary artery bypass graft x4.  It was discussed with the  patient.  All that over the next couple of weeks her stamina and  endurance should improve.  She was instructed she may begin driving  short distances, 25 minutes or less and gradually over the next couple  of weeks drive longer distances (the patient is not taking narcotics).  The patient has been contacted by Cardiac Rehab.  She was instructed she  may begin this.  The patient has an appointment to see Dr. Juanda Chance on  February 21, 2010.  She will continue to follow him long term.  The patient  was instructed to continue with sternal precautions (i.e., no lifting  more than 10 pounds).  Finally, the patient's chest x-ray was reviewed.  It was ultimately decided with the patient that the patient will be  given a prescription for Lasix (she already has enough potassium for the  next 10 days and hopefully the diuresis will help decrease this left  pleural effusion).  She is going to return in follow up with a chest x-  ray in 1 week.  In the interim, the patient was instructed if she has  any increasing  shortness of breath she is to contact our office  immediately.  She is also to keep a daily record of her weights.  The  patient was made aware that she may have to have a left thoracentesis if  the effusion worsens or she becomes even more symptomatic.   Evelene Croon, M.D.  Electronically Signed   DZ/MEDQ  D:  02/20/2010  T:  02/21/2010  Job:  [id number]   cc:   Everardo Beals. Juanda Chance, MD, [facility]

## 2011-04-17 NOTE — Assessment & Plan Note (Signed)
[facility]                            CARDIOLOGY OFFICE NOTE   BEAUTY, PLESS                      MRN:          [id number]  DATE:05/19/2007                            DOB:          January 11, 1941    REFERRING PHYSICIAN:  Leonides Grills, M.D.   PRIMARY CARE PHYSICIAN:  Donia Guiles, M.D.   REASON FOR REFERRAL:  Preoperative evaluation prior to foot surgery.   CLINICAL HISTORY:  Erica [name] is 70 years old and has coronary artery  disease and has had multiple percutaneous coronary interventions.  Her  last intervention was in May 2006, at which time she had tandem  overlapping stents placed in the mid right coronary artery.  She has  residual 30% narrowing in the left main, 70-80% narrowing in the mid to  distal circumflex artery, and 80% narrowing in the diagonal branch of  the LAD.  And, she has normal LV function.  She has been doing fairly well from a cardiac standpoint.  She has had a  few episodes of left-sided chest pain which had been prolonged and lasts  all day and not related to exertion.  These have occurred about once a  month over the last 3 months.  She has had no exertionally related chest  pain, although she has not been as active on the treadmill due to her  foot problem.  She recently developed a problem with her foot that will  require rather extensive surgery and she and Dr. Lestine Box are planning on  scheduling this for September.   PAST MEDICAL HISTORY:  1. Hypertension.  2. Hyperlipidemia.   CURRENT MEDICATIONS:  Atenolol, Welchol, aspirin, multivitamin, Plavix,  Lipitor, Levothyroxine.   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  She exercises regularly.   For details of family history, see old chart.   REVIEW OF SYSTEMS:  Positive for her right foot pain.   PHYSICAL EXAMINATION:  VITAL SIGNS:  Blood pressure is 105/67 and the  pulse is 57 and regular.  NECK:  There was no  venous distention.  The carotid pulses were full  without bruits.  CHEST:  Clear without rales or rhonchi.  CARDIAC:  Rhythm was regular.  No murmurs or gallops.  ABDOMEN:  Soft with normal bowel sounds.  There is no  hepatosplenomegaly.  EXTREMITIES:  Peripheral pulses are full and there is no peripheral  edema.  MUSCULOSKELETAL:  Showed no deformities.  SKIN:  Warm and dry.  NEUROLOGIC:  Showed no focal neurological signs.   An electrocardiogram was normal.   IMPRESSION:  1. Preoperative evaluation prior to right foot surgery.  2. Coronary artery disease status post multiple percutaneous coronary      interventions as described above.  3. Good left ventricular function.  4. Hyperlipidemia.  5. Hypertension.   RECOMMENDATIONS:  Ms. Ferrera is stable from a cardiac standpoint but  she does have significant coronary disease and I think we should  evaluate her with an exercise rest stress Myoview scan prior to her  surgery.  We will plan to schedule  this in the next couple of weeks.  I  will not make any changes in her medications.  Dr. Arvilla Market recently  checked her lipid profile and switched her back from Crestor to Lipitor  at the patient's request, since the HDL and LDL were not quite as good.     Bruce Elvera Lennox Juanda Chance, MD, [facility]  Electronically Signed    BRB/MedQ  DD: 05/19/2007  DT: 05/19/2007  Job #: [id number]   cc:   Leonides Grills, M.D.  Donia Guiles, M.D.

## 2011-04-20 NOTE — Cardiovascular Report (Signed)
[name], Erica [name]               ACCOUNT NO.:  [id number]   MEDICAL RECORD NO.:  [id number]          PATIENT TYPE:  OIB   LOCATION:  2899                         FACILITY:  [facility]   PHYSICIAN:  Charlies Constable, M.D. [facility] DATE OF BIRTH:  1941-03-18   DATE OF PROCEDURE:  DATE OF DISCHARGE:                              CARDIAC CATHETERIZATION   Audio too short to transcribe (less than 5 seconds)      BB/MEDQ  D:  04/03/2005  T:  04/03/2005  Job:  [id number]

## 2011-04-20 NOTE — Procedures (Signed)
[facility]  Patient:    Erica [name], Erica [name] Visit Number: [id number] MRN: [id number]          Service Type: END Location: ENDO Attending Physician:  Dennison Bulla Ii Dictated by:   Verlin Grills, M.D. Proc. Date: 12/30/01 Admit Date:  12/30/2001 Discharge Date: 12/30/2001   CC:         Desma Maxim, M.D.                           Procedure Report  REFERRING PHYSICIAN:  Desma Maxim, M.D.  PROCEDURE:  Screening colonoscopy.  PROCEDURE INDICATION:  Erica [name] is a 70 year old female born February 13, 1942.  Erica [name] is due for her first screening colonoscopy with polypectomy to prevent colon cancer.  I discussed with Erica [name] the complications associated with colonoscopy and polypectomy, including a 15 per thousand risk of bleeding and four per thousand risk of colon perforation requiring surgical repair.  Erica [name] has signed the operative permit.  ENDOSCOPIST:  Verlin Grills, M.D.  PREMEDICATION:  Versed 5 mg, fentanyl 50 mcg.  ENDOSCOPE:  Olympus pediatric colonoscope.  DESCRIPTION OF PROCEDURE:  After obtaining informed consent, Erica [name] was placed in the left lateral decubitus position.  I administered intravenous fentanyl and intravenous Versed to achieve conscious sedation for the procedure.  The patients blood pressure, oxygen saturation, and cardiac rhythm were monitored throughout the procedure and documented in the medical record.  Anal inspection was normal.  Digital rectal exam was normal.  The Olympus pediatric video colonoscope was introduced into the rectum and easily advanced to the cecum.  A normal-appearing ileocecal valve was intubated and the distal ileum inspected.  Colonic preparation for the exam today was excellent.  Rectum normal.  Sigmoid colon and descending colon normal.  Splenic flexure normal.  Transverse colon normal.  Hepatic flexure normal.  Ascending  colon normal.  Cecum and ileocecal valve normal.  Distal ileum normal.  ASSESSMENT:  Normal screening proctocolonoscopy to the cecum with intubation of a normal-appearing ileocecal valve and distal ileal inspection. Dictated by:   Verlin Grills, M.D. Attending Physician:  Dennison Bulla Ii DD:  12/30/01 TD:  12/30/01 Job: [id number]

## 2011-04-20 NOTE — Discharge Summary (Signed)
[name], [name]               ACCOUNT NO.:  [id number]   MEDICAL RECORD NO.:  [id number]          PATIENT TYPE:  OIB   LOCATION:  6523                         FACILITY:  [facility]   PHYSICIAN:  Charlies Constable, M.D. [facility] DATE OF BIRTH:  1941/11/01   DATE OF ADMISSION:  04/03/2005  DATE OF DISCHARGE:  04/04/2005                                 DISCHARGE SUMMARY   PROCEDURE:  TAXUS stenting right coronary artery, Apr 03, 2005.   REASON FOR ADMISSION:  Erica [name] is a 70 year old female  with known  coronary artery disease status post prior percutaneous intervention followed  by Dr. Charlies Constable who underwent recent evaluation of chest pain with an  exercise stress Myoview.  Although profusion images were normal the patient  had complaint of chest pain with associated EKG changes and was referred for  outpatient cardiac catheterization per Dr. Charlies Constable.   LABORATORY DATA:  CBC normal, electrolytes and renal function normal,  elevated glucose 117 on admission, normal liver enzymes.  Lipid profile:  Total cholesterol 146, triglyceride 86, HDL 49, LDL 80, TSH  3.3.   Admission chest x-ray:  Cardiomegaly:  Probable mild chronic interstitial  changes.   HOSPITAL COURSE:  The patient presented for elective cardiac catheterization  in the [facility] lab, per Dr. Charlies Constable, and was found to have 80% in-stent  restenosis of the right coronary artery.  Left ventricular function was  normal.   Dr. Juanda Chance proceeded with successful TAXUS stenting, with no noted  complications, with reduction of the lesion to less than 10% residual  stenosis.   The patient was kept overnight in observation and cleared for discharge the  following morning in hemodynamically stable condition.  Postprocedure  cardiac markers were normal.   Of note, the patient did have documented sinus pauses of 2-2.5 seconds with  a heart rate of approximately 30 beats per minute; however, she was  asymptomatic.  Atenolol was  placed on hold but will be resumed at discharge.  The patient may require further evaluation as an outpatient.   NEW MEDICATIONS:  Plavix.   DISCHARGE MEDICATIONS:  1.  Plavix 75 mg (x 1 year).  2.  Enteric-coated aspirin 325 mg daily.  3.  Atenolol 25 mg daily.  4.  Lipitor 40 mg daily.  5.  Zetia 10 mg daily.  6.  Levoxyl 0.088 mg daily.  7.  WelChol 625 mg two tablets b.i.d.  8.  Nitrostat 0.4 mg as directed.   DISCHARGE INSTRUCTIONS:  No heavy lifting/driving x2 days; maintain low  fat/cholesterol diet; call the office if there is any swelling/bruising.   The patient will follow up with Dr. Charlies Constable on Wednesday, Apr 25, 2005  at 12 noon.   DISCHARGE DIAGNOSES:  1.  Coronary artery disease progression.      1.  Status post TAXUS stenting mid right coronary artery secondary to in-          stent restenosis-Apr 03, 2005.      2.  Normal left ventricular function.      3.  False-negative exercise Myoview, March 19, 2005.  4.  Status post percutaneous intervention right coronary artery 1998.  2.  Asymptomatic bradycardia.  3.  Hypertension.  4.  Hyperlipidemia.  5.  Hypothyroidism.      GS/MEDQ  D:  04/04/2005  T:  04/04/2005  Job:  [id number]   cc:   Dr. Neysa Bonito (?)

## 2011-04-20 NOTE — Cardiovascular Report (Signed)
NAMEALAZIA, Erica [name]               ACCOUNT NO.:  [id number]   MEDICAL RECORD NO.:  [id number]          PATIENT TYPE:  OIB   LOCATION:  2899                         FACILITY:  [facility]   PHYSICIAN:  Charlies Constable, M.D. [facility] DATE OF BIRTH:  1941/08/10   DATE OF PROCEDURE:  04/03/2005  DATE OF DISCHARGE:                              CARDIAC CATHETERIZATION   PROCEDURE:  Cardiac catheterization and percutaneous coronary intervention.   CARDIOLOGIST:  Charlies Constable, M.D.   INDICATIONS FOR PROCEDURE:  Erica [name] is 70 years old.  She has had a  previous rotational atherectomy and stenting of the mid-right coronary  artery.  She has had symptoms of mild angina due to stress test volume.  She  had chest pain and significant electrocardiogram changes, although a scan  did not show any reversible defect.  I was concerned about progression of  the disease and made a decision to bring her in for an evaluation with  angiography.   DESCRIPTION OF PROCEDURE:  The procedure was performed via the right femoral  artery and arterial sheath and 6-French preformed coronary catheters.  A  femoral arterial puncture was performed.  Omnipaque contrast was used.  At  the completion of the diagnostic study, and after a review of the films with  Dr. Arturo Morton. Stuckey, we made a decision to proceed with intervention on  the right coronary artery.   DESCRIPTION OF PROCEDURE:  The patient was given an Angio-Max bolus and  infusion and was given 300 mg of Plavix.  We used a JR4, 6-French guiding  catheter with side holes and a PT2 Lite S'port wire.  We crossed the lesion  within the stent in the mid-right coronary artery with the wire without  difficulty.  We pre-dilated with a 2.5 mm x 20.0 mm Maverick, performing two  inflations up to 8 atmospheres for 30 seconds.  We then deployed a 2.5 mm x  60.0 mm TAXUS stent in the mid to distal right coronary artery extending  just outside the old radius stent.  We deployed  this with one inflation of  14 atmospheres for 30 seconds.  We then deployed a second 2.5 mm x 60.0 mm  TAXUS stent overlapping the first stent in the mid-right coronary artery and  extending outside of the previous radius stent.  We deployed this with one  inflation of 14 atmospheres for 30 seconds.  We then dilated both stents  with a 2.75 mm x 20.0 mm Quantum Maverick, performing two inflations up to  16 atmospheres for 30 seconds.  Repeat diagnostic studies were then  performed through the guiding catheter.   The patient tolerated the procedure well and left the laboratory in  satisfactory condition.   RESULTS:  Aortic pressure:  155/6, mean of 1.  Left ventricular pressure:  155/9.   1.  LEFT MAIN CORONARY ARTERY:  The left main coronary artery had a 30%      distal stenosis.  2.  LEFT ANTERIOR DESCENDING CORONARY ARTERY:  The left anterior descending      coronary artery gave rise to two septal perforators  and two diagonal      branches.  The flow was irregular and there was 40% narrowing in the      distal portion of the vessel.  There was 80% ostial stenosis in the      first diagonal branch.  3.  CIRCUMFLEX CORONARY ARTERY:  The circumflex coronary artery gave rise to      an atrial branch and a marginal branch.  There was 40% ostial stenosis.      There was heavy calcification of the proximal vessel.  There was 70%-80%      stenosis in the mid-vessel before the marginal branch.  There was 80%      ostial stenosis in the AV circumflex just after the marginal branch, but      this was a small vessel.  4.  RIGHT CORONARY ARTERY:  The right coronary artery is a moderate-sized      vessel and gives rise to a conus branch, two right ventricular branches,      a posterior descending and two posterolateral branches.  There was 40%      segmental narrowing in the proximal right coronary artery.  There was      80% narrowing within the stent in the mid-right coronary artery.  There       was 80% ostial narrowing in the posterior descending branch of the right      coronary artery.   LEFT VENTRICULOGRAM:  The left ventriculogram performed in the RAO  projection showed good wall motion with no areas of hypokinesis.  The  estimated ejection fraction was 60%.   Following the placement of tandem overlying TAXUS stents in the mid-right  coronary artery for in-stent restenosis, the stenosis improved from 80% to  less than 10%.   CONCLUSION:  1.  Coronary artery disease, status post previous stenting of the right      coronary artery as described above, with a 30% narrowing in the distal      left main coronary artery, a 40% narrowing in the distal left anterior      descending coronary artery, with an 80% narrowing in the first diagonal      branch, a 40% ostial narrowing in the circumflex artery, with 70%-80%      narrowing in the mid-circumflex coronary artery and 80% narrowing in the      distal AV branch of the circumflex coronary artery, a 40% narrowing in      the proximal right coronary artery, 80% narrowing within the stent in      the mid-right coronary artery, and 80% ostial stenosis in the posterior      descending branch of the right coronary artery with good left      ventricular function.  2.  Successful percutaneous coronary intervention for in-stent restenosis in      the mid-right coronary artery using two overlapping TAXUS drug-eluting      stents, with improvement in the percent diameter narrowing from 80% to      less than 10%.   DISPOSITION:  The patient returned to the post-angioplasty unit for further  observation.      BB/MEDQ  D:  04/03/2005  T:  04/03/2005  Job:  [id number]   cc:   Donia Guiles, M.D.  [address] [place]  [place] [address]  Fax: [phone]   Cardiopulmonary Lab  -  [facility]

## 2011-05-25 ENCOUNTER — Encounter: Payer: Self-pay | Admitting: Cardiovascular Disease

## 2011-09-17 LAB — CBC
HCT: 35.7 — ABNORMAL LOW
MCHC: 34.6
MCV: 88.1
Platelets: 197
RBC: 4.06
WBC: 6.3

## 2011-09-17 LAB — BASIC METABOLIC PANEL
BUN: 11
CO2: 26
Chloride: 105
Creatinine, Ser: 0.83

## 2011-10-04 ENCOUNTER — Encounter: Payer: Self-pay | Admitting: *Deleted

## 2011-10-05 ENCOUNTER — Encounter: Payer: Self-pay | Admitting: Cardiovascular Disease

## 2011-10-05 ENCOUNTER — Ambulatory Visit (INDEPENDENT_AMBULATORY_CARE_PROVIDER_SITE_OTHER): Payer: Medicare Other | Admitting: Cardiovascular Disease

## 2011-10-05 VITALS — BP 132/76 | HR 49 | Ht 63.0 in | Wt 132.0 lb

## 2011-10-05 DIAGNOSIS — E783 Hyperchylomicronemia: Secondary | ICD-10-CM

## 2011-10-05 DIAGNOSIS — I6529 Occlusion and stenosis of unspecified carotid artery: Secondary | ICD-10-CM

## 2011-10-05 DIAGNOSIS — I2510 Atherosclerotic heart disease of native coronary artery without angina pectoris: Secondary | ICD-10-CM

## 2011-10-05 DIAGNOSIS — I10 Essential (primary) hypertension: Secondary | ICD-10-CM

## 2011-10-05 DIAGNOSIS — R0989 Other specified symptoms and signs involving the circulatory and respiratory systems: Secondary | ICD-10-CM

## 2011-10-05 NOTE — Assessment & Plan Note (Signed)
The patient appears stable and is less than 2 years out from coronary bypass surgery. She has no exertional angina. If she has recurrent symptoms of chest pain or "indigestion" I would favor pursuing a Myoview stress scan. With only to self limited episodes in 18 months I think we can wait and see how she does for now.

## 2011-10-05 NOTE — Progress Notes (Signed)
HPI:  This is a 70 year old woman presenting for followup evaluation. The patient has long-standing CAD and has undergone multiple PCI procedures. She ultimately required coronary bypass surgery in 2011. She was treated with a LIMA to LAD, sequential saphenous vein graft to posterolateral and PDA branches, and saphenous vein graft to the obtuse marginal. She had postoperative shortness of breath and pleural effusion requiring thoracentesis. She has fully recovered and is doing well at the present time. In the last 18 months she describes 2 episodes of "indigestion" which have felt similar to her previous angina. The symptoms were mild but still have raised some concern. Both were self limited and required no therapy. She exercises regularly without symptoms. She specifically denies exertional chest pain or pressure. She has no dyspnea, palpitations, or edema.  Outpatient Encounter Prescriptions as of 10/05/2011  Medication Sig Dispense Refill  . aspirin 325 MG tablet Take 325 mg by mouth daily.        Marland Kitchen atenolol (TENORMIN) 25 MG tablet Take 25 mg by mouth daily.        Marland Kitchen atorvastatin (LIPITOR) 40 MG tablet Take 40 mg by mouth daily.        . Calcium Carbonate-Vitamin D 600-400 MG-UNIT per tablet Take 1 tablet by mouth daily.        . Cholecalciferol (VITAMIN D3) 1000 UNITS CAPS Take by mouth.        . colesevelam (WELCHOL) 625 MG tablet Take 1,875 mg by mouth 2 (two) times daily with a meal.       . levothyroxine (SYNTHROID, LEVOTHROID) 88 MCG tablet Take 88 mcg by mouth daily.        . Multiple Vitamin (MULTIVITAMIN) tablet Take 1 tablet by mouth daily.        . naproxen sodium (ANAPROX) 220 MG tablet Take 220 mg by mouth 2 (two) times daily with a meal.          No Known Allergies  Past Medical History  Diagnosis Date  . Coronary artery disease     status post multiple percutaneous interventions  . Hypertension   . Hyperlipidemia   . Hypothyroidism   . Osteoarthritis     ROS: Negative  except as per HPI  BP 132/76  Pulse 49  Ht 5\' 3"  (1.6 m)  Wt 132 lb (59.875 kg)  BMI 23.38 kg/m2  PHYSICAL EXAM: Pt is alert and oriented, NAD HEENT: normal Neck: JVP - normal, carotids 2+= with right carotid bruit Lungs: CTA bilaterally CV: RRR without murmur or gallop Abd: soft, NT, Positive BS, no hepatomegaly Ext: no C/C/E, distal pulses intact and equal Skin: warm/dry no rash  EKG:  Marked sinus bradycardia 49 beats per minute, otherwise within normal limits.  ASSESSMENT AND PLAN:

## 2011-10-05 NOTE — Assessment & Plan Note (Signed)
The patient is treated with atorvastatin 40 mg. Her lipids were reviewed from earlier this year and they are at goal with an LDL less than 70.

## 2011-10-05 NOTE — Assessment & Plan Note (Signed)
The patient has a right carotid bruit. She had mild heterogenous plaque on her pre-CABG Doppler. Recommend repeat carotid Doppler before her followup office visit in one year

## 2011-10-05 NOTE — Patient Instructions (Signed)
Your physician wants you to follow-up in: 12 months.  You will receive a reminder letter in the mail two months in advance. If you don't receive a letter, please call our office to schedule the follow-up appointment.  Your physician has requested that you have a carotid duplex. This test is an ultrasound of the carotid arteries in your neck. It looks at blood flow through these arteries that supply the brain with blood. Allow one hour for this exam. There are no restrictions or special instructions. To be done in 12 months prior to appointment with Dr. Excell Seltzer.

## 2011-10-05 NOTE — Assessment & Plan Note (Signed)
Blood pressure is well controlled. Home blood pressures generally range lower from 110-120 systolic.

## 2011-10-31 ENCOUNTER — Encounter: Payer: Self-pay | Admitting: Cardiovascular Disease

## 2012-11-04 ENCOUNTER — Encounter: Payer: Self-pay | Admitting: Cardiovascular Disease

## 2012-11-04 ENCOUNTER — Encounter (INDEPENDENT_AMBULATORY_CARE_PROVIDER_SITE_OTHER): Payer: Medicare Other

## 2012-11-04 ENCOUNTER — Ambulatory Visit (INDEPENDENT_AMBULATORY_CARE_PROVIDER_SITE_OTHER): Payer: Medicare Other | Admitting: Cardiovascular Disease

## 2012-11-04 VITALS — BP 122/66 | HR 46 | Ht 62.0 in | Wt 131.0 lb

## 2012-11-04 DIAGNOSIS — R0989 Other specified symptoms and signs involving the circulatory and respiratory systems: Secondary | ICD-10-CM

## 2012-11-04 DIAGNOSIS — I6529 Occlusion and stenosis of unspecified carotid artery: Secondary | ICD-10-CM

## 2012-11-04 DIAGNOSIS — I10 Essential (primary) hypertension: Secondary | ICD-10-CM

## 2012-11-04 MED ORDER — ASPIRIN 81 MG PO TABS: 81.0000 mg | ORAL_TABLET | Freq: Every day | ORAL | Status: DC

## 2012-11-04 NOTE — Progress Notes (Signed)
   HPI:  71 year old woman presenting for followup evaluation. The patient has a long history of coronary artery disease. She underwent CABG in 2011 when she was treated with the LIMA to LAD, sequential saphenous vein graft to posterolateral and PDA branches, and vein graft obtuse marginal. Prior to bypass surgery she had undergone multiple PCI procedures.  Overall the patient is doing well. She's not been exercising as much because her daughter had a prolonged hospitalization recently. She denies chest pain or pressure. She's had no recent dyspnea, palpitations, lightheadedness, or syncope. She's had no stroke or TIA symptoms.  Outpatient Encounter Prescriptions as of 11/04/2012  Medication Sig Dispense Refill  . aspirin 81 MG tablet Take 1 tablet (81 mg total) by mouth daily.  30 tablet  0  . atenolol (TENORMIN) 25 MG tablet Take 25 mg by mouth daily.        Marland Kitchen atorvastatin (LIPITOR) 40 MG tablet Take 40 mg by mouth daily.        . Calcium Carbonate-Vitamin D 600-400 MG-UNIT per tablet Take 1 tablet by mouth daily.        . Cholecalciferol (VITAMIN D3) 1000 UNITS CAPS Take by mouth.        . colesevelam (WELCHOL) 625 MG tablet Take 1,875 mg by mouth 2 (two) times daily with a meal.       . levothyroxine (SYNTHROID, LEVOTHROID) 88 MCG tablet Take 88 mcg by mouth daily.        . Multiple Vitamin (MULTIVITAMIN) tablet Take 1 tablet by mouth daily.        . naproxen sodium (ANAPROX) 220 MG tablet Take one tablet as needed.      . [DISCONTINUED] aspirin 325 MG tablet Take 325 mg by mouth daily.          No Known Allergies  Past Medical History  Diagnosis Date  . Coronary artery disease     status post multiple percutaneous interventions  . Hypertension   . Hyperlipidemia   . Hypothyroidism   . Osteoarthritis     ROS: Negative except as per HPI  BP 122/66  Pulse 46  Ht 5\' 2"  (1.575 m)  Wt 59.421 kg (131 lb)  BMI 23.96 kg/m2  SpO2 99%  PHYSICAL EXAM: Pt is alert and oriented,  NAD HEENT: normal Neck: JVP - normal, carotids 2+= with bilateral bruits left greater than right Lungs: CTA bilaterally CV: RRR without murmur or gallop Abd: soft, NT, Positive BS, no hepatomegaly Ext: no C/C/E, distal pulses intact and equal Skin: warm/dry no rash  EKG:  Marked sinus bradycardia 46 beats per minute, nonspecific ST abnormality  ASSESSMENT AND PLAN: 1. Coronary artery disease status post CABG. The patient is stable without anginal symptoms. She will continue her current medical program with the exception of reducing her aspirin from 325 mg to 81 mg daily. I would like to see her back in followup in 1 year.  2. Hyperlipidemia. The patient's lipids are followed by her primary care physician. She's on a combination of atorvastatin and WelChol.  3. Bradycardia. The patient is having no symptoms related to bradycardia. She specifically denied lightheadedness, near-syncope, or fatigue. I will not make any changes to her medical program at this time.  4. Carotid bruit. A carotid duplex will be performed for further evaluation.  Tonny Bollman 11/04/2012 1:28 PM

## 2012-11-04 NOTE — Patient Instructions (Addendum)
**Note De-Identified Kelton Bultman Obfuscation** Your physician has requested that you have a carotid duplex. This test is an ultrasound of the carotid arteries in your neck. It looks at blood flow through these arteries that supply the brain with blood. Allow one hour for this exam. There are no restrictions or special instructions.  We will attempt to schedule this procedure to be done today.  Your physician has recommended you make the following change in your medication: decrease Aspirin to 81 mg daily  Your physician wants you to follow-up in: 1 year. You will receive a reminder letter in the mail two months in advance. If you don't receive a letter, please call our office to schedule the follow-up appointment.

## 2013-01-09 ENCOUNTER — Encounter: Payer: Self-pay | Admitting: Cardiovascular Disease

## 2013-01-17 ENCOUNTER — Other Ambulatory Visit: Payer: Self-pay

## 2013-07-08 ENCOUNTER — Other Ambulatory Visit: Payer: Self-pay

## 2013-08-25 ENCOUNTER — Encounter: Payer: Medicare Other | Attending: Family Medicine

## 2013-08-25 VITALS — Ht 62.0 in | Wt 133.3 lb

## 2013-08-25 DIAGNOSIS — Z713 Dietary counseling and surveillance: Secondary | ICD-10-CM | POA: Insufficient documentation

## 2013-08-25 DIAGNOSIS — E119 Type 2 diabetes mellitus without complications: Secondary | ICD-10-CM

## 2013-08-27 NOTE — Progress Notes (Signed)
Patient was seen on 08/25/13 for the first of a series of three diabetes self-management courses at the [facility].   Current HbA1c: 6.2%  The following learning objectives were met by the patient during this course:   Defines the role of glucose and insulin  Identifies type of diabetes and pathophysiology  Defines the diagnostic criteria for diabetes and prediabetes  States the risk factors for Type 2 Diabetes  States the symptoms of Type 2 Diabetes  Defines Type 2 Diabetes treatment goals  Defines Type 2 Diabetes treatment options  States the rationale for glucose monitoring  Identifies A1C, glucose targets, and testing times  Identifies proper sharps disposal  Defines the purpose of a diabetes food plan  Identifies carbohydrate food groups  Defines effects of carbohydrate foods on glucose levels  Identifies carbohydrate choices/grams/food labels  States benefits of physical activity and effect on glucose  Review of suggested activity guidelines  Handouts given during class include:  Type 2 Diabetes: Basics Book  My Food Plan Book  Food and Activity Log  Your patient has identified their diabetes self-care support plan as:  [facility] support group   Follow-Up Plan: Attend core 2 and core 3

## 2013-09-01 DIAGNOSIS — E119 Type 2 diabetes mellitus without complications: Secondary | ICD-10-CM

## 2013-09-01 NOTE — Progress Notes (Signed)
  Patient was seen on 09/01/13 for the second of a series of three diabetes self-management courses at the [facility]. The following learning objectives were met by the patient during this course:   Explain basic nutrition maintenance and quality assurance  Describe causes, symptoms and treatment of hypoglycemia and hyperglycemia  Explain how to manage diabetes during illness  Describe the importance of good nutrition for health and healthy eating strategies  List strategies to follow meal plan when dining out  Describe the effects of alcohol on glucose and how to use it safely  Describe problem solving skills for day-to-day glucose challenges  Describe strategies to use when treatment plan needs to change  Identify important factors involved in successful weight loss  Describe ways to remain physically active  Describe the impact of regular activity on insulin resistance   Follow-Up Plan: Patient will attend the final class of the ADA Diabetes Self-Care Education.    

## 2013-09-03 ENCOUNTER — Encounter: Payer: Medicare Other | Admitting: *Deleted

## 2013-09-03 ENCOUNTER — Encounter: Payer: Medicare Other | Attending: Family Medicine

## 2013-09-03 DIAGNOSIS — Z713 Dietary counseling and surveillance: Secondary | ICD-10-CM | POA: Insufficient documentation

## 2013-09-03 DIAGNOSIS — E119 Type 2 diabetes mellitus without complications: Secondary | ICD-10-CM | POA: Insufficient documentation

## 2013-09-03 NOTE — Patient Instructions (Addendum)
Goals:  - Check blood glucose as instructed by MD.  - Call or email MD or myself with any problems or concerns. - Make sure to wash hands or use alcohol wipes before checking blood glucose

## 2013-09-03 NOTE — Progress Notes (Signed)
Patient was seen on 09/03/13 for the third of a series of three diabetes self-management courses at the [facility]. The following learning objectives were met by the patient during this course:    Describe how diabetes changes over time   Identify diabetes complications and ways to prevent them   Describe strategies that can promote heart health including lowering blood pressure and cholesterol   Describe strategies to lower dietary fat and sodium in the diet   Identify physical activities that benefit cardiovascular health   Describe role of stress on blood glucose and develop strategies to address psychosocial issues   Evaluate success in meeting personal goal   Describe the belief that they can live successfully with diabetes day to day   Establish 2-3 goals that they will plan to diligently work on until they return for the free 37-month follow-up visit  The following handouts were given in class:  Goal setting handout  Class evaluation form  Low-sodium seasoning tips  Stress management handout  Your patient has established the following 4 month goals for diabetes self-care:  Reduce fat in my diet by eating less butter and fried foods  Your patient has identified these potential barriers to change:  Husband doesn't like vegetables   Your patient has identified their diabetes self-care support plan as:  [facility] support group  husband   Follow-Up Plan: Patient was offered a 4 month follow-up visit for diabetes self-management education.

## 2013-09-03 NOTE — Progress Notes (Signed)
Appt start time: 1200   End time:  1240.  Primary Concerns Today:  Patient was seen on  09/03/2013 for individual blood glucose monitoring education. Has own meter and strips. Has never checked BGs before. Has A1c of 6.2%. Showed pt how to load lansing device and discussed step-by-step protocol for checking blood glucose including hand washing, etc. Pt verbalized and demonstrated an understanding of instructions.  Checked her own BG during visit, which was pre-prandial and 112 mg/dl.  Also discussed food options and actual energy/CHO needs per meal.   Current HbA1c: 6.2%  BG in office (pre-prandial): 112 mg/dL  Estimated Energy Needs 1200 calories 135 g carbs  60-80 g protein 35 g fat (<10 g as saturated fat)  Samples and Handouts given during visit include:   Target Blood Glucose Levels  PB2: 1 packet Lot: NONE; Exp: 12/15/13  Monitoring/Evaluation:  Dietary intake, exercise, A1c, Blood Glucose trends, and body weight prn.

## 2013-10-08 ENCOUNTER — Other Ambulatory Visit: Payer: Self-pay

## 2013-11-20 ENCOUNTER — Ambulatory Visit (HOSPITAL_COMMUNITY): Payer: Medicare Other | Attending: Cardiovascular Disease

## 2013-11-20 DIAGNOSIS — I6529 Occlusion and stenosis of unspecified carotid artery: Secondary | ICD-10-CM

## 2013-11-24 ENCOUNTER — Ambulatory Visit (INDEPENDENT_AMBULATORY_CARE_PROVIDER_SITE_OTHER): Payer: BC Managed Care – HMO | Admitting: Cardiovascular Disease

## 2013-11-24 ENCOUNTER — Encounter: Payer: Self-pay | Admitting: Cardiovascular Disease

## 2013-11-24 VITALS — BP 140/72 | HR 63 | Ht 61.5 in | Wt 130.0 lb

## 2013-11-24 DIAGNOSIS — I2510 Atherosclerotic heart disease of native coronary artery without angina pectoris: Secondary | ICD-10-CM

## 2013-11-24 DIAGNOSIS — R0989 Other specified symptoms and signs involving the circulatory and respiratory systems: Secondary | ICD-10-CM

## 2013-11-24 DIAGNOSIS — R079 Chest pain, unspecified: Secondary | ICD-10-CM

## 2013-11-24 DIAGNOSIS — I10 Essential (primary) hypertension: Secondary | ICD-10-CM

## 2013-11-24 NOTE — Patient Instructions (Signed)
Your physician recommends that you continue on your current medications as directed. Please refer to the Current Medication list given to you today.  Your physician wants you to follow-up in: 1 year with Dr. Cooper.  You will receive a reminder letter in the mail two months in advance. If you don't receive a letter, please call our office to schedule the follow-up appointment.   

## 2013-11-24 NOTE — Progress Notes (Signed)
HPI:  72 year-old woman presenting for followup evaluation. The patient is followed for CAD. She underwent CABG in 2011 when she was treated with the LIMA to LAD, sequential vein graft to PLA and PDA, and vein graft obtuse marginal. She was noted to have a carotid bruit at the time of her last exam. A duplex study 11/20/2013 showed 40-59% right ICA stenosis and less than 40% left ICA stenosis. Lipids have been followed by her primary care physician.  The patient has been under a lot of stress. Her husband is been in the hospital with heart failure for the past week. She's had rare episodes of chest discomfort that feel like "heartburn." These have been unlike her anginal symptoms in the past. She lives with angina for a few years before her bypass surgery, but has not had problems since then. She denies shortness of breath, edema, or palpitations. She has recently been diagnosed with diabetes by her primary care physician, but she's working on dietary modification and hasn't required oral hypoglycemics yet.  Outpatient Encounter Prescriptions as of 11/24/2013  Medication Sig  . aspirin 81 MG tablet Take 1 tablet (81 mg total) by mouth daily.  Marland Kitchen atenolol (TENORMIN) 25 MG tablet Take 25 mg by mouth daily.    . Calcium Carbonate-Vitamin D 600-400 MG-UNIT per tablet Take 1 tablet by mouth daily.    . Celecoxib (CELEBREX PO) Take 20 mg by mouth daily.  . Cholecalciferol (VITAMIN D3) 1000 UNITS CAPS Take by mouth.    . colesevelam (WELCHOL) 625 MG tablet Take 1,875 mg by mouth 2 (two) times daily with a meal.   . levothyroxine (SYNTHROID, LEVOTHROID) 88 MCG tablet Take 88 mcg by mouth daily.    . Multiple Vitamin (MULTIVITAMIN) tablet Take 1 tablet by mouth daily.    . naproxen sodium (ANAPROX) 220 MG tablet Take one tablet as needed.  . rosuvastatin (CRESTOR) 20 MG tablet Take 20 mg by mouth daily.  . traMADol (ULTRAM) 50 MG tablet TAKE ONE TABLET ONCE DAILY  . [DISCONTINUED] atorvastatin (LIPITOR)  40 MG tablet Take 40 mg by mouth daily.      No Known Allergies  Past Medical History  Diagnosis Date  . Coronary artery disease     status post multiple percutaneous interventions  . Hypertension   . Hyperlipidemia   . Hypothyroidism   . Osteoarthritis     ROS: Negative except as per HPI  BP 140/72  Pulse 63  Ht 5' 1.5" (1.562 m)  Wt 130 lb (58.968 kg)  BMI 24.17 kg/m2  SpO2 99%  PHYSICAL EXAM: Pt is alert and oriented, NAD HEENT: normal Neck: JVP - normal, carotids 2+= with bilateral bruits Lungs: CTA bilaterally CV: RRR without murmur or gallop Abd: soft, NT, Positive BS, no hepatomegaly Ext: no C/C/E, distal pulses intact and equal Skin: warm/dry no rash  EKG:  Normal sinus rhythm 63 beats per minute, nonspecific ST abnormality.  ASSESSMENT AND PLAN: 1. Coronary artery disease, native vessel. The patient is stable with symptoms of atypical chest pain but nothing reminiscent of her previous angina. She will continue on her current medical program. If symptoms progress or change in character, we will pursue stress testing. I will see her in followup in 1 year.  2. Hyperlipidemia. She's treated by her primary care physician with a combination of WelChol and Crestor.  3. Hypertension. Blood pressure is controlled on atenolol 25 mg daily.  4. Carotid stenosis with no history of stroke or TIA. Recent  carotid duplex was reviewed. Recommend followup in 1 year.  Tonny Bollman 11/24/2013 3:43 PM

## 2014-01-08 ENCOUNTER — Ambulatory Visit: Payer: BC Managed Care – HMO | Admitting: *Deleted

## 2014-06-17 ENCOUNTER — Telehealth: Payer: Self-pay | Admitting: Cardiovascular Disease

## 2014-06-17 NOTE — Telephone Encounter (Signed)
Walk In pt Form "Medical Clearance" gave to Lauren 7.16.15/km

## 2014-09-03 ENCOUNTER — Ambulatory Visit
Admission: RE | Admit: 2014-09-03 | Discharge: 2014-09-03 | Disposition: A | Discharge status: Home / Self Care | Payer: Commercial Managed Care - HMO | Source: Ambulatory Visit | Attending: Family Medicine | Admitting: Family Medicine

## 2014-09-03 ENCOUNTER — Other Ambulatory Visit: Payer: Self-pay | Admitting: Family Medicine

## 2014-09-03 DIAGNOSIS — R0989 Other specified symptoms and signs involving the circulatory and respiratory systems: Secondary | ICD-10-CM

## 2014-09-06 ENCOUNTER — Encounter: Payer: Self-pay | Admitting: Cardiovascular Disease

## 2014-11-17 ENCOUNTER — Other Ambulatory Visit (HOSPITAL_COMMUNITY): Payer: Self-pay | Admitting: *Deleted

## 2014-11-17 DIAGNOSIS — I6523 Occlusion and stenosis of bilateral carotid arteries: Secondary | ICD-10-CM

## 2014-12-02 ENCOUNTER — Ambulatory Visit (INDEPENDENT_AMBULATORY_CARE_PROVIDER_SITE_OTHER): Payer: Commercial Managed Care - HMO | Admitting: Cardiovascular Disease

## 2014-12-02 ENCOUNTER — Encounter: Payer: Self-pay | Admitting: Cardiovascular Disease

## 2014-12-02 ENCOUNTER — Ambulatory Visit (HOSPITAL_COMMUNITY): Payer: Commercial Managed Care - HMO | Attending: Cardiovascular Disease | Admitting: Cardiology

## 2014-12-02 VITALS — BP 142/68 | HR 47 | Ht 61.5 in | Wt 139.6 lb

## 2014-12-02 DIAGNOSIS — I2510 Atherosclerotic heart disease of native coronary artery without angina pectoris: Secondary | ICD-10-CM

## 2014-12-02 DIAGNOSIS — I6523 Occlusion and stenosis of bilateral carotid arteries: Secondary | ICD-10-CM | POA: Insufficient documentation

## 2014-12-02 DIAGNOSIS — R079 Chest pain, unspecified: Secondary | ICD-10-CM

## 2014-12-02 MED ORDER — NITROGLYCERIN 0.4 MG SL SUBL: 0.4000 mg | SUBLINGUAL_TABLET | SUBLINGUAL | Status: DC | PRN

## 2014-12-02 NOTE — Progress Notes (Signed)
Background: The patient is followed for CAD. She underwent CABG in 2011 when she was treated with the LIMA to LAD, sequential vein graft to PLA and PDA, and vein graft obtuse marginal. She also has mild carotid stenosis with 40-59% ICA stenosis on the right and less than 40% ICA stenosis on the left. Her hyperlipidemia has been managed by her primary physician.  HPI:  73 year old woman presenting for follow-up evaluation. Since her last visit here she has had 4 or 5 episodes of substernal chest pressure. Symptoms have been nonradiating and not associated with physical exertion. There are some characteristics similar to her previous cardiac pain. She's had no dyspnea with exertion, heart palpitations, orthopnea, or PND. She does admit to mild leg swelling. She is not engaged in regular exercise but she is physically active. She states on average she will takes between 10,000 and 18,000 steps per day.    Outpatient Encounter Prescriptions as of 12/02/2014  Medication Sig  . aspirin 81 MG tablet Take 1 tablet (81 mg total) by mouth daily.  Marland Kitchen atenolol (TENORMIN) 25 MG tablet Take 25 mg by mouth daily.    Marland Kitchen atorvastatin (LIPITOR) 40 MG tablet Take 40 mg by mouth daily at 6 PM.   . Celecoxib (CELEBREX PO) Take 20 mg by mouth daily.  . colesevelam (WELCHOL) 625 MG tablet Take 1,875 mg by mouth 2 (two) times daily with a meal.   . levothyroxine (SYNTHROID, LEVOTHROID) 88 MCG tablet Take 88 mcg by mouth daily.    . Multiple Vitamin (MULTIVITAMIN) tablet Take 1 tablet by mouth daily.    . [DISCONTINUED] Calcium Carbonate-Vitamin D 600-400 MG-UNIT per tablet Take 1 tablet by mouth daily.    . [DISCONTINUED] Cholecalciferol (VITAMIN D3) 1000 UNITS CAPS Take by mouth.    . [DISCONTINUED] naproxen sodium (ANAPROX) 220 MG tablet Take one tablet as needed.  . [DISCONTINUED] rosuvastatin (CRESTOR) 20 MG tablet Take 20 mg by mouth daily.  . [DISCONTINUED] traMADol (ULTRAM) 50 MG tablet TAKE ONE TABLET ONCE DAILY     No Known Allergies  Past Medical History  Diagnosis Date  . Coronary artery disease     status post multiple percutaneous interventions  . Hypertension   . Hyperlipidemia   . Hypothyroidism   . Osteoarthritis     family history includes Other in an other family member; Other (age of onset: 62) in her mother.   ROS: Negative except as per HPI  BP 142/68 mmHg  Pulse 47  Ht 5' 1.5" (1.562 m)  Wt 139 lb 9.6 oz (63.322 kg)  BMI 25.95 kg/m2  PHYSICAL EXAM: Pt is alert and oriented, NAD HEENT: normal Neck: JVP - normal, carotids 2+= without bruits Lungs: CTA bilaterally CV: RRR without murmur or gallop Abd: soft, NT, Positive BS, no hepatomegaly Ext: no C/C/E, distal pulses intact and equal Skin: warm/dry no rash  EKG:  Sinus bradycardia 47 bpm, otherwise within normal limits.  ASSESSMENT AND PLAN: 1. Coronary artery disease, native vessel, with episodes of angina with both typical and atypical features. I have recommended a nuclear stress test to evaluate for significant ischemia. She is not able to achieve an adequate heart rate even with holding her beta blocker his story clean. Will order a [place] study. She will continue her current medications without change.  2. Hyperlipidemia. She continues on WelChol and atorvastatin. Followed by her primary physician.  3. Hypertension. Continue atenolol.  Her follow-up I will see her back in one year unless significant problems identified  on her stress test.  [name], MD 12/02/2014 3:29 PM

## 2014-12-02 NOTE — Progress Notes (Signed)
Carotid duplex performed 

## 2014-12-02 NOTE — Patient Instructions (Signed)
Your physician has requested that you have a lexiscan myoview. For further information please visit [email or web address]. Please follow instruction sheet, as given.  Your physician wants you to follow-up in: 1 year with Dr. Burt Knack. You will receive a reminder letter in the mail two months in advance. If you don't receive a letter, please call our office to schedule the follow-up appointment.  Your physician recommends that you continue on your current medications as directed. Please refer to the Current Medication list given to you today.

## 2014-12-07 DIAGNOSIS — E782 Mixed hyperlipidemia: Secondary | ICD-10-CM | POA: Diagnosis not present

## 2014-12-09 ENCOUNTER — Ambulatory Visit (HOSPITAL_COMMUNITY): Payer: Commercial Managed Care - HMO | Attending: Cardiology | Admitting: Radiology

## 2014-12-09 ENCOUNTER — Encounter: Payer: Self-pay | Admitting: Cardiology

## 2014-12-09 DIAGNOSIS — R079 Chest pain, unspecified: Secondary | ICD-10-CM

## 2014-12-09 DIAGNOSIS — R0602 Shortness of breath: Secondary | ICD-10-CM | POA: Insufficient documentation

## 2014-12-09 DIAGNOSIS — I10 Essential (primary) hypertension: Secondary | ICD-10-CM | POA: Insufficient documentation

## 2014-12-09 DIAGNOSIS — E785 Hyperlipidemia, unspecified: Secondary | ICD-10-CM | POA: Diagnosis not present

## 2014-12-09 DIAGNOSIS — I2510 Atherosclerotic heart disease of native coronary artery without angina pectoris: Secondary | ICD-10-CM

## 2014-12-09 MED ORDER — TECHNETIUM TC 99M SESTAMIBI GENERIC - CARDIOLITE
11.0000 | Freq: Once | INTRAVENOUS | Status: AC | PRN
  Administered 2014-12-09: 11 via INTRAVENOUS

## 2014-12-09 MED ORDER — TECHNETIUM TC 99M SESTAMIBI GENERIC - CARDIOLITE
33.0000 | Freq: Once | INTRAVENOUS | Status: AC | PRN
  Administered 2014-12-09: 33 via INTRAVENOUS

## 2014-12-09 MED ORDER — REGADENOSON 0.4 MG/5ML IV SOLN
0.4000 mg | Freq: Once | INTRAVENOUS | Status: AC
  Administered 2014-12-09: 0.4 mg via INTRAVENOUS

## 2014-12-09 NOTE — Progress Notes (Signed)
[facility]LE[facility] [address] [place], [place] [address] [phone]    Cardiology Nuclear Med Study  Erica [name] is a 74 y.o. female     MRN : [id number]     DOB: 04/08/1941  Procedure Date: 12/09/2014  Nuclear Med Background Indication for Stress Test:  Evaluation for Ischemia, Graft/PTCA Patency, and Pending Surgical Clearance for  Oral Surgery by Dr. Junita Push History:  CAD, MPI ~5-6 yrs ago, Carotid Bruit Cardiac Risk Factors: Hypertension and Lipids  Symptoms:  Chest Pain (last date of chest discomfort was one month ago), DOE and SOB   Nuclear Pre-Procedure Caffeine/Decaff Intake:  None> 12 hrs NPO After: 9:00pm   Lungs:  clear O2 Sat: 98% on room air. IV 0.9% NS with Angio Cath:  22g  IV Site: R Antecubital x 1, tolerated well IV Started by:  Irven Baltimore, RN  Chest Size (in):  36 Cup Size: B  Height: 5' 1.5" (1.562 m)  Weight:  138 lb (62.596 kg)  BMI:  Body mass index is 25.66 kg/(m^2). Tech Comments:  Patient took Atenolol this am. Irven Baltimore, RN.    Nuclear Med Study 1 or 2 day study: 1 day  Stress Test Type:  Treadmill/Lexiscan  Reading MD: N/A  Order Authorizing Provider:  [name], MD  Resting Radionuclide: Technetium 78m Sestamibi  Resting Radionuclide Dose: 11.0 mCi   Stress Radionuclide:  Technetium 60m Sestamibi  Stress Radionuclide Dose: 33.0 mCi           Stress Protocol Rest HR: 45 Stress HR: 69  Rest BP: 166/71 Stress BP: 129/50  Exercise Time (min): n/a METS: n/a   Predicted Max HR: 147 bpm % Max HR: 46.94 bpm Rate Pressure Product: 10143   Dose of Adenosine (mg):  n/a Dose of Lexiscan: 0.4 mg  Dose of Atropine (mg): n/a Dose of Dobutamine: n/a mcg/kg/min (at max HR)  Stress Test Technologist: Glade Lloyd, BS-ES  Nuclear Technologist:  Earl Many, CNMT     Rest Procedure:  Myocardial perfusion imaging was performed at rest 45 minutes following the intravenous administration of Technetium 78m  Sestamibi. Rest ECG: Sinus bradycardia rate 45, poor R-wave progression  Stress Procedure:  The patient received IV Lexiscan 0.4 mg over 15-seconds.  Technetium 40m Sestamibi injected at 30-seconds.  Quantitative spect images were obtained after a 45 minute delay.  During the infusion of Lexiscan the patient complained of SOB that resolved in recovery.  Stress ECG: No significant change from baseline ECG  QPS Raw Data Images:  Normal; no motion artifact; normal heart/lung ratio. Stress Images:  There is mild basal septal wall decreased uptake seen at both rest and stress, otherwise, homogeneous radiotracer uptake seen at both rest and stress Rest Images:  As above Subtraction (SDS):  No evidence of ischemia. Transient Ischemic Dilatation (Normal <1.22):  1.02 Lung/Heart Ratio (Normal <0.45):  0.29  Quantitative Gated Spect Images QGS EDV:  89 ml QGS ESV:  35 ml  Impression Exercise Capacity:  Lexiscan with no exercise. BP Response:  Normal blood pressure response. Clinical Symptoms:  Mild shortness of breath noted ECG Impression:  No significant ST segment change suggestive of ischemia. Comparison with Prior Nuclear Study: No images to compare  Overall Impression:  Low risk stress nuclear study with no evidence of ischemia identified..  LV Ejection Fraction: 60%.  LV Wall Motion:  Septal wall hypokinesis consistent with post bypass changes. Otherwise, normal left ventricular contractile performance.  Candee Furbish, MD

## 2015-03-16 ENCOUNTER — Other Ambulatory Visit: Payer: Self-pay | Admitting: Family Medicine

## 2015-03-16 ENCOUNTER — Ambulatory Visit
Admission: RE | Admit: 2015-03-16 | Discharge: 2015-03-16 | Disposition: A | Discharge status: Home / Self Care | Payer: Commercial Managed Care - HMO | Source: Ambulatory Visit | Attending: Family Medicine | Admitting: Family Medicine

## 2015-03-16 DIAGNOSIS — I2510 Atherosclerotic heart disease of native coronary artery without angina pectoris: Secondary | ICD-10-CM | POA: Diagnosis not present

## 2015-03-16 DIAGNOSIS — E039 Hypothyroidism, unspecified: Secondary | ICD-10-CM | POA: Diagnosis not present

## 2015-03-16 DIAGNOSIS — I517 Cardiomegaly: Secondary | ICD-10-CM | POA: Diagnosis not present

## 2015-03-16 DIAGNOSIS — I700 Atherosclerosis of aorta: Secondary | ICD-10-CM | POA: Diagnosis not present

## 2015-03-16 DIAGNOSIS — Z87891 Personal history of nicotine dependence: Secondary | ICD-10-CM | POA: Diagnosis not present

## 2015-03-16 DIAGNOSIS — R079 Chest pain, unspecified: Secondary | ICD-10-CM

## 2015-03-16 DIAGNOSIS — H3530 Unspecified macular degeneration: Secondary | ICD-10-CM | POA: Diagnosis not present

## 2015-03-16 DIAGNOSIS — R7301 Impaired fasting glucose: Secondary | ICD-10-CM | POA: Diagnosis not present

## 2015-03-16 DIAGNOSIS — R0789 Other chest pain: Secondary | ICD-10-CM | POA: Diagnosis not present

## 2015-03-16 DIAGNOSIS — I119 Hypertensive heart disease without heart failure: Secondary | ICD-10-CM | POA: Diagnosis not present

## 2015-03-16 DIAGNOSIS — E782 Mixed hyperlipidemia: Secondary | ICD-10-CM | POA: Diagnosis not present

## 2015-03-16 DIAGNOSIS — Z0000 Encounter for general adult medical examination without abnormal findings: Secondary | ICD-10-CM | POA: Diagnosis not present

## 2015-03-18 ENCOUNTER — Encounter: Payer: Self-pay | Admitting: Cardiovascular Disease

## 2015-03-21 DIAGNOSIS — Z1231 Encounter for screening mammogram for malignant neoplasm of breast: Secondary | ICD-10-CM | POA: Diagnosis not present

## 2015-04-05 DIAGNOSIS — M2042 Other hammer toe(s) (acquired), left foot: Secondary | ICD-10-CM | POA: Diagnosis not present

## 2015-04-05 DIAGNOSIS — M79672 Pain in left foot: Secondary | ICD-10-CM | POA: Diagnosis not present

## 2015-05-05 DIAGNOSIS — E039 Hypothyroidism, unspecified: Secondary | ICD-10-CM | POA: Diagnosis not present

## 2015-05-30 ENCOUNTER — Other Ambulatory Visit: Payer: Self-pay

## 2015-07-01 DIAGNOSIS — I119 Hypertensive heart disease without heart failure: Secondary | ICD-10-CM | POA: Diagnosis not present

## 2015-07-01 DIAGNOSIS — F4320 Adjustment disorder, unspecified: Secondary | ICD-10-CM | POA: Diagnosis not present

## 2015-07-01 DIAGNOSIS — G4700 Insomnia, unspecified: Secondary | ICD-10-CM | POA: Diagnosis not present

## 2015-07-19 ENCOUNTER — Other Ambulatory Visit: Payer: Self-pay | Admitting: Family Medicine

## 2015-07-19 ENCOUNTER — Ambulatory Visit
Admission: RE | Admit: 2015-07-19 | Discharge: 2015-07-19 | Disposition: A | Discharge status: Home / Self Care | Payer: Commercial Managed Care - HMO | Source: Ambulatory Visit | Attending: Family Medicine | Admitting: Family Medicine

## 2015-07-19 DIAGNOSIS — M79672 Pain in left foot: Secondary | ICD-10-CM

## 2015-07-19 DIAGNOSIS — S99922A Unspecified injury of left foot, initial encounter: Secondary | ICD-10-CM | POA: Diagnosis not present

## 2015-08-03 DIAGNOSIS — H2513 Age-related nuclear cataract, bilateral: Secondary | ICD-10-CM | POA: Diagnosis not present

## 2015-08-03 DIAGNOSIS — H524 Presbyopia: Secondary | ICD-10-CM | POA: Diagnosis not present

## 2015-08-06 ENCOUNTER — Emergency Department (HOSPITAL_BASED_OUTPATIENT_CLINIC_OR_DEPARTMENT_OTHER): Payer: Commercial Managed Care - HMO

## 2015-08-06 ENCOUNTER — Encounter (HOSPITAL_BASED_OUTPATIENT_CLINIC_OR_DEPARTMENT_OTHER): Payer: Self-pay | Admitting: *Deleted

## 2015-08-06 ENCOUNTER — Emergency Department (HOSPITAL_BASED_OUTPATIENT_CLINIC_OR_DEPARTMENT_OTHER)
Admission: EM | Admit: 2015-08-06 | Discharge: 2015-08-06 | Disposition: A | Discharge status: Home / Self Care | Payer: Commercial Managed Care - HMO | Attending: Emergency Medicine | Admitting: Emergency Medicine

## 2015-08-06 DIAGNOSIS — E785 Hyperlipidemia, unspecified: Secondary | ICD-10-CM | POA: Diagnosis not present

## 2015-08-06 DIAGNOSIS — Z79899 Other long term (current) drug therapy: Secondary | ICD-10-CM | POA: Diagnosis not present

## 2015-08-06 DIAGNOSIS — Z87891 Personal history of nicotine dependence: Secondary | ICD-10-CM | POA: Insufficient documentation

## 2015-08-06 DIAGNOSIS — Z951 Presence of aortocoronary bypass graft: Secondary | ICD-10-CM | POA: Insufficient documentation

## 2015-08-06 DIAGNOSIS — S61012A Laceration without foreign body of left thumb without damage to nail, initial encounter: Secondary | ICD-10-CM | POA: Diagnosis not present

## 2015-08-06 DIAGNOSIS — I2510 Atherosclerotic heart disease of native coronary artery without angina pectoris: Secondary | ICD-10-CM | POA: Diagnosis not present

## 2015-08-06 DIAGNOSIS — M1990 Unspecified osteoarthritis, unspecified site: Secondary | ICD-10-CM | POA: Diagnosis not present

## 2015-08-06 DIAGNOSIS — Y9389 Activity, other specified: Secondary | ICD-10-CM | POA: Diagnosis not present

## 2015-08-06 DIAGNOSIS — E039 Hypothyroidism, unspecified: Secondary | ICD-10-CM | POA: Insufficient documentation

## 2015-08-06 DIAGNOSIS — Z9861 Coronary angioplasty status: Secondary | ICD-10-CM | POA: Insufficient documentation

## 2015-08-06 DIAGNOSIS — S61112A Laceration without foreign body of left thumb with damage to nail, initial encounter: Secondary | ICD-10-CM | POA: Diagnosis not present

## 2015-08-06 DIAGNOSIS — I10 Essential (primary) hypertension: Secondary | ICD-10-CM | POA: Insufficient documentation

## 2015-08-06 DIAGNOSIS — Y9289 Other specified places as the place of occurrence of the external cause: Secondary | ICD-10-CM | POA: Insufficient documentation

## 2015-08-06 DIAGNOSIS — Z7982 Long term (current) use of aspirin: Secondary | ICD-10-CM | POA: Insufficient documentation

## 2015-08-06 DIAGNOSIS — Y998 Other external cause status: Secondary | ICD-10-CM | POA: Insufficient documentation

## 2015-08-06 DIAGNOSIS — Z9889 Other specified postprocedural states: Secondary | ICD-10-CM | POA: Insufficient documentation

## 2015-08-06 DIAGNOSIS — W010XXA Fall on same level from slipping, tripping and stumbling without subsequent striking against object, initial encounter: Secondary | ICD-10-CM | POA: Diagnosis not present

## 2015-08-06 DIAGNOSIS — IMO0002 Reserved for concepts with insufficient information to code with codable children: Secondary | ICD-10-CM

## 2015-08-06 MED ORDER — LIDOCAINE HCL (PF) 1 % IJ SOLN: 5.0000 mL | Freq: Once | INTRAMUSCULAR | Status: DC

## 2015-08-06 MED ORDER — LIDOCAINE HCL (PF) 1 % IJ SOLN
10.0000 mL | Freq: Once | INTRAMUSCULAR | Status: DC
  Filled 2015-08-06: qty 10

## 2015-08-06 NOTE — ED Notes (Signed)
Sutures to Left thumb by EDP, abx ointment applied and dry dsg also applied per EDP orders, pt tolerated well, instructed to keep dsg CD&I

## 2015-08-06 NOTE — ED Provider Notes (Signed)
CSN: [id number]     Arrival date & time 08/06/15  1338 History   First MD Initiated Contact with Patient 08/06/15 1424     Chief Complaint  Patient presents with  . Extremity Laceration     (Consider location/radiation/quality/duration/timing/severity/associated sxs/prior Treatment) HPI Comments: Patient presents with laceration to her left thumb. She was chasing some decrease and tripped and fell landing on her left hand. She developed a laceration from the gravel driveway. She denies any other injuries. There is no head injury. No neck or back pain. Her tetanus shot is up-to-date.   Past Medical History  Diagnosis Date  . Coronary artery disease     status post multiple percutaneous interventions  . Hypertension   . Hyperlipidemia   . Hypothyroidism   . Osteoarthritis    Past Surgical History  Procedure Laterality Date  . Ankle surgery    . 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 Cardiac catheterization  12-2008  . Coronary stent placement  2006     right coronary artery with drug-eluting stent  . Coronary stent placement  1998     bare-metal stent to the right coronary artery  . Coronary stent placement  2008    drug-eluting stent placed in the left circumflex   Family History  Problem Relation Age of Onset  . Other Mother 43    accident  . Other      Patient was adopted and has no information about her siblings   Social History  Substance Use Topics  . Smoking status: Former Research scientist (life sciences)  . Smokeless tobacco: None  . Alcohol Use: No   OB History    No data available     Review of Systems  Constitutional: Negative for fever.  Gastrointestinal: Negative for nausea and vomiting.  Musculoskeletal: Negative for back pain, joint swelling, arthralgias and neck pain.  Skin: Negative for wound.  Neurological: Negative for weakness, numbness and headaches.      Allergies  Review of patient's allergies indicates no known allergies.  Home Medications   Prior  to Admission medications   Medication Sig Start Date End Date Taking? Authorizing Provider  aspirin 81 MG tablet Take 1 tablet (81 mg total) by mouth daily. 11/04/12   [name], MD  atenolol (TENORMIN) 25 MG tablet Take 25 mg by mouth daily.      Historical Provider, MD  atorvastatin (LIPITOR) 40 MG tablet Take 40 mg by mouth daily at 6 PM.  11/12/14   Historical Provider, MD  Celecoxib (CELEBREX PO) Take 20 mg by mouth daily.    Historical Provider, MD  colesevelam (WELCHOL) 625 MG tablet Take 1,875 mg by mouth 2 (two) times daily with a meal.     Historical Provider, MD  levothyroxine (SYNTHROID, LEVOTHROID) 88 MCG tablet Take 88 mcg by mouth daily.      Historical Provider, MD  Multiple Vitamin (MULTIVITAMIN) tablet Take 1 tablet by mouth daily.      Historical Provider, MD  nitroGLYCERIN (NITROSTAT) 0.4 MG SL tablet Place 1 tablet (0.4 mg total) under the tongue every 5 (five) minutes as needed for chest pain. 12/02/14   [name], MD   BP 179/60 mmHg  Pulse 53  Temp(Src) 98.2 F (36.8 C)  Resp 16  Ht 5\' 2"  (1.575 m)  Wt 126 lb (57.153 kg)  BMI 23.04 kg/m2  SpO2 97% Physical Exam  Constitutional: She is oriented to person, place, and time. She appears well-developed and well-nourished.  HENT:  Head: Normocephalic and atraumatic.  Neck: Normal range of motion. Neck supple.  Cardiovascular: Normal rate.   Pulmonary/Chest: Effort normal.  Musculoskeletal: She exhibits no edema or tenderness.  Patient has a 1.5 cm V-shaped laceration to the base of her left thumb. She has normal sensation distally. Capillary refill is less than 2 distally. She is able to flex and extend the thumb without problem. No underlying bony tenderness.  Neurological: She is alert and oriented to person, place, and time.  Skin: Skin is warm and dry.  Psychiatric: She has a normal mood and affect.    ED Course  LACERATION REPAIR Date/Time: 08/06/2015 3:28 PM Performed by: Dabria Wadas Authorized  by: Malvin Johns Consent: Verbal consent obtained. Risks and benefits: risks, benefits and alternatives were discussed Consent given by: patient Patient identity confirmed: verbally with patient Body area: upper extremity Location details: left thumb Laceration length: 1.5 cm Foreign bodies: no foreign bodies Tendon involvement: none Nerve involvement: none Vascular damage: no Anesthesia: local infiltration Local anesthetic: lidocaine 1% without epinephrine Anesthetic total: 3 ml Patient sedated: no Preparation: Patient was prepped and draped in the usual sterile fashion. Irrigation solution: saline Irrigation method: syringe Amount of cleaning: standard Debridement: none Skin closure: 4-0 Prolene Number of sutures: 3 Technique: simple Approximation: close Approximation difficulty: simple Patient tolerance: Patient tolerated the procedure well with no immediate complications Comments: The edge of the v-shape flap was slightly dusky, advised pt that the skin there may end up sloughing off.   (including critical care time) Labs Review Labs Reviewed - No data to display  Imaging Review Dg Finger Thumb Left  08/06/2015   CLINICAL DATA:  Left proximal thumb pain and laceration after falling today on a gravel driveway off chasing geese.  EXAM: LEFT THUMB 2+V  COMPARISON:  None.  FINDINGS: There are degenerative changes in the radial aspect of the wrist, including first metacarpal/carpal degenerative changes. Mild degenerative changes involving the first MCP and IP joints. No fracture, dislocation or radiopaque foreign body seen.  IMPRESSION: No fracture.  Degenerative changes.   Electronically Signed   By: Claudie Revering M.D.   On: 08/06/2015 15:05   I have personally reviewed and evaluated these images and lab results as part of my medical decision-making.   EKG Interpretation None      MDM   Final diagnoses:  Laceration    Patient is given wound care instructions. Her  tetanus shot is up-to-date. She was advised to have the sutures removed in 10 days or return sooner for any signs of infection    Malvin Johns, MD 08/06/15 1531

## 2015-08-06 NOTE — ED Notes (Signed)
Pt again asked if tetanus booster is UTD, stated again 'Yes"

## 2015-08-06 NOTE — ED Notes (Signed)
Fell on [employer] this am, states tripped after chasing geese, sm laceration, irregular shape, to left thumb. Placed in betadine soak

## 2015-08-06 NOTE — ED Notes (Signed)
No active bleeding noted at this time

## 2015-08-06 NOTE — ED Notes (Signed)
Pt c/o lac to left hand by fall in gravel driveway x 4 hrs ago

## 2015-08-06 NOTE — ED Notes (Signed)
Pt states tetanus shot is current

## 2015-08-06 NOTE — Discharge Instructions (Signed)

## 2015-09-16 DIAGNOSIS — R7301 Impaired fasting glucose: Secondary | ICD-10-CM | POA: Diagnosis not present

## 2015-09-16 DIAGNOSIS — I700 Atherosclerosis of aorta: Secondary | ICD-10-CM | POA: Diagnosis not present

## 2015-09-16 DIAGNOSIS — E782 Mixed hyperlipidemia: Secondary | ICD-10-CM | POA: Diagnosis not present

## 2015-09-16 DIAGNOSIS — I2510 Atherosclerotic heart disease of native coronary artery without angina pectoris: Secondary | ICD-10-CM | POA: Diagnosis not present

## 2015-09-16 DIAGNOSIS — R55 Syncope and collapse: Secondary | ICD-10-CM | POA: Diagnosis not present

## 2015-09-16 DIAGNOSIS — I119 Hypertensive heart disease without heart failure: Secondary | ICD-10-CM | POA: Diagnosis not present

## 2015-09-16 DIAGNOSIS — J069 Acute upper respiratory infection, unspecified: Secondary | ICD-10-CM | POA: Diagnosis not present

## 2015-09-16 DIAGNOSIS — E039 Hypothyroidism, unspecified: Secondary | ICD-10-CM | POA: Diagnosis not present

## 2015-10-12 DIAGNOSIS — B349 Viral infection, unspecified: Secondary | ICD-10-CM | POA: Diagnosis not present

## 2015-10-17 ENCOUNTER — Emergency Department (HOSPITAL_COMMUNITY): Payer: Commercial Managed Care - HMO

## 2015-10-17 ENCOUNTER — Ambulatory Visit
Admission: RE | Admit: 2015-10-17 | Discharge: 2015-10-17 | Disposition: A | Discharge status: Home / Self Care | Payer: Commercial Managed Care - HMO | Source: Ambulatory Visit | Attending: Family Medicine | Admitting: Family Medicine

## 2015-10-17 ENCOUNTER — Encounter (HOSPITAL_COMMUNITY): Payer: Self-pay | Admitting: Emergency Medicine

## 2015-10-17 ENCOUNTER — Other Ambulatory Visit: Payer: Self-pay | Admitting: Family Medicine

## 2015-10-17 ENCOUNTER — Inpatient Hospital Stay (HOSPITAL_COMMUNITY)
Admission: EM | Admit: 2015-10-17 | Discharge: 2015-10-21 | DRG: 872 | Disposition: A | Discharge status: Home / Self Care | Payer: Commercial Managed Care - HMO | Attending: Internal Medicine | Admitting: Internal Medicine

## 2015-10-17 DIAGNOSIS — I5189 Other ill-defined heart diseases: Secondary | ICD-10-CM | POA: Diagnosis present

## 2015-10-17 DIAGNOSIS — E785 Hyperlipidemia, unspecified: Secondary | ICD-10-CM | POA: Diagnosis present

## 2015-10-17 DIAGNOSIS — Z7982 Long term (current) use of aspirin: Secondary | ICD-10-CM

## 2015-10-17 DIAGNOSIS — N132 Hydronephrosis with renal and ureteral calculous obstruction: Secondary | ICD-10-CM | POA: Diagnosis present

## 2015-10-17 DIAGNOSIS — Z79899 Other long term (current) drug therapy: Secondary | ICD-10-CM | POA: Diagnosis not present

## 2015-10-17 DIAGNOSIS — Z951 Presence of aortocoronary bypass graft: Secondary | ICD-10-CM | POA: Diagnosis not present

## 2015-10-17 DIAGNOSIS — N134 Hydroureter: Secondary | ICD-10-CM | POA: Diagnosis not present

## 2015-10-17 DIAGNOSIS — R0789 Other chest pain: Secondary | ICD-10-CM | POA: Diagnosis not present

## 2015-10-17 DIAGNOSIS — I25810 Atherosclerosis of coronary artery bypass graft(s) without angina pectoris: Secondary | ICD-10-CM | POA: Diagnosis not present

## 2015-10-17 DIAGNOSIS — I2510 Atherosclerotic heart disease of native coronary artery without angina pectoris: Secondary | ICD-10-CM | POA: Diagnosis present

## 2015-10-17 DIAGNOSIS — Z955 Presence of coronary angioplasty implant and graft: Secondary | ICD-10-CM

## 2015-10-17 DIAGNOSIS — R109 Unspecified abdominal pain: Secondary | ICD-10-CM | POA: Insufficient documentation

## 2015-10-17 DIAGNOSIS — A419 Sepsis, unspecified organism: Secondary | ICD-10-CM | POA: Diagnosis not present

## 2015-10-17 DIAGNOSIS — R5383 Other fatigue: Secondary | ICD-10-CM | POA: Diagnosis not present

## 2015-10-17 DIAGNOSIS — I10 Essential (primary) hypertension: Secondary | ICD-10-CM | POA: Diagnosis present

## 2015-10-17 DIAGNOSIS — R5081 Fever presenting with conditions classified elsewhere: Secondary | ICD-10-CM | POA: Diagnosis not present

## 2015-10-17 DIAGNOSIS — R0989 Other specified symptoms and signs involving the circulatory and respiratory systems: Secondary | ICD-10-CM | POA: Diagnosis present

## 2015-10-17 DIAGNOSIS — R197 Diarrhea, unspecified: Secondary | ICD-10-CM | POA: Diagnosis not present

## 2015-10-17 DIAGNOSIS — N139 Obstructive and reflux uropathy, unspecified: Secondary | ICD-10-CM | POA: Diagnosis present

## 2015-10-17 DIAGNOSIS — N179 Acute kidney failure, unspecified: Secondary | ICD-10-CM | POA: Diagnosis present

## 2015-10-17 DIAGNOSIS — I700 Atherosclerosis of aorta: Secondary | ICD-10-CM | POA: Diagnosis present

## 2015-10-17 DIAGNOSIS — B961 Klebsiella pneumoniae [K. pneumoniae] as the cause of diseases classified elsewhere: Secondary | ICD-10-CM | POA: Diagnosis present

## 2015-10-17 DIAGNOSIS — M1990 Unspecified osteoarthritis, unspecified site: Secondary | ICD-10-CM | POA: Diagnosis present

## 2015-10-17 DIAGNOSIS — N390 Urinary tract infection, site not specified: Secondary | ICD-10-CM | POA: Diagnosis not present

## 2015-10-17 DIAGNOSIS — Z87891 Personal history of nicotine dependence: Secondary | ICD-10-CM

## 2015-10-17 DIAGNOSIS — E039 Hypothyroidism, unspecified: Secondary | ICD-10-CM | POA: Diagnosis not present

## 2015-10-17 DIAGNOSIS — R509 Fever, unspecified: Secondary | ICD-10-CM

## 2015-10-17 DIAGNOSIS — R0902 Hypoxemia: Secondary | ICD-10-CM | POA: Diagnosis not present

## 2015-10-17 DIAGNOSIS — Z791 Long term (current) use of non-steroidal anti-inflammatories (NSAID): Secondary | ICD-10-CM

## 2015-10-17 DIAGNOSIS — A4150 Gram-negative sepsis, unspecified: Secondary | ICD-10-CM | POA: Diagnosis present

## 2015-10-17 DIAGNOSIS — R011 Cardiac murmur, unspecified: Secondary | ICD-10-CM | POA: Diagnosis not present

## 2015-10-17 DIAGNOSIS — N201 Calculus of ureter: Secondary | ICD-10-CM | POA: Diagnosis not present

## 2015-10-17 DIAGNOSIS — N10 Acute tubulo-interstitial nephritis: Secondary | ICD-10-CM | POA: Diagnosis not present

## 2015-10-17 LAB — CBC WITH DIFFERENTIAL/PLATELET
Basophils Absolute: 0 10*3/uL (ref 0.0–0.1)
Basophils Relative: 0 %
EOS PCT: 0 %
Eosinophils Absolute: 0 10*3/uL (ref 0.0–0.7)
HEMATOCRIT: 29.7 % — AB (ref 36.0–46.0)
HEMOGLOBIN: 10.1 g/dL — AB (ref 12.0–15.0)
LYMPHS ABS: 0.5 10*3/uL — AB (ref 0.7–4.0)
LYMPHS PCT: 3 %
MCH: 29.1 pg (ref 26.0–34.0)
MCHC: 34 g/dL (ref 30.0–36.0)
MCV: 85.6 fL (ref 78.0–100.0)
MONOS PCT: 9 %
Monocytes Absolute: 1.6 10*3/uL — ABNORMAL HIGH (ref 0.1–1.0)
NEUTROS ABS: 15.4 10*3/uL — AB (ref 1.7–7.7)
Neutrophils Relative %: 88 %
Platelets: 303 10*3/uL (ref 150–400)
RBC: 3.47 MIL/uL — AB (ref 3.87–5.11)
RDW: 13.8 % (ref 11.5–15.5)
WBC: 17.5 10*3/uL — AB (ref 4.0–10.5)

## 2015-10-17 LAB — URINALYSIS, ROUTINE W REFLEX MICROSCOPIC
Bilirubin Urine: NEGATIVE
GLUCOSE, UA: NEGATIVE mg/dL
KETONES UR: NEGATIVE mg/dL
NITRITE: NEGATIVE
PH: 5.5 (ref 5.0–8.0)
Protein, ur: 100 mg/dL — AB
SPECIFIC GRAVITY, URINE: 1.014 (ref 1.005–1.030)
Urobilinogen, UA: 0.2 mg/dL (ref 0.0–1.0)

## 2015-10-17 LAB — COMPREHENSIVE METABOLIC PANEL
ALBUMIN: 2.6 g/dL — AB (ref 3.5–5.0)
ALK PHOS: 72 U/L (ref 38–126)
ALT: 23 U/L (ref 14–54)
ANION GAP: 12 (ref 5–15)
AST: 19 U/L (ref 15–41)
BILIRUBIN TOTAL: 0.7 mg/dL (ref 0.3–1.2)
BUN: 27 mg/dL — AB (ref 6–20)
CO2: 21 mmol/L — AB (ref 22–32)
Calcium: 8.2 mg/dL — ABNORMAL LOW (ref 8.9–10.3)
Chloride: 102 mmol/L (ref 101–111)
Creatinine, Ser: 1.46 mg/dL — ABNORMAL HIGH (ref 0.44–1.00)
GFR calc Af Amer: 40 mL/min — ABNORMAL LOW (ref >60)
GFR calc non Af Amer: 34 mL/min — ABNORMAL LOW (ref >60)
GLUCOSE: 147 mg/dL — AB (ref 65–99)
Potassium: 3.8 mmol/L (ref 3.5–5.1)
SODIUM: 135 mmol/L (ref 135–145)
TOTAL PROTEIN: 6.1 g/dL — AB (ref 6.5–8.1)

## 2015-10-17 LAB — URINE MICROSCOPIC-ADD ON

## 2015-10-17 LAB — MRSA PCR SCREENING: MRSA BY PCR: NEGATIVE

## 2015-10-17 LAB — I-STAT CG4 LACTIC ACID, ED: Lactic Acid, Venous: 1.49 mmol/L (ref 0.5–2.0)

## 2015-10-17 MED ORDER — ONDANSETRON HCL 4 MG PO TABS: 4.0000 mg | ORAL_TABLET | Freq: Four times a day (QID) | ORAL | Status: DC | PRN

## 2015-10-17 MED ORDER — VANCOMYCIN HCL 500 MG IV SOLR
500.0000 mg | INTRAVENOUS | Status: DC
  Filled 2015-10-17: qty 500

## 2015-10-17 MED ORDER — DEXTROSE 5 % IV SOLN: 2.0000 g | INTRAVENOUS | Status: DC

## 2015-10-17 MED ORDER — ATORVASTATIN CALCIUM 40 MG PO TABS
40.0000 mg | ORAL_TABLET | Freq: Every day | ORAL | Status: DC
  Administered 2015-10-18 – 2015-10-20 (×3): 40 mg via ORAL
  Filled 2015-10-17 (×4): qty 1

## 2015-10-17 MED ORDER — SODIUM CHLORIDE 0.9 % IV BOLUS (SEPSIS)
1000.0000 mL | INTRAVENOUS | Status: AC
  Administered 2015-10-17 (×2): 1000 mL via INTRAVENOUS

## 2015-10-17 MED ORDER — ACETAMINOPHEN 325 MG PO TABS
650.0000 mg | ORAL_TABLET | Freq: Four times a day (QID) | ORAL | Status: DC | PRN
  Administered 2015-10-18 – 2015-10-19 (×5): 650 mg via ORAL
  Filled 2015-10-17 (×5): qty 2

## 2015-10-17 MED ORDER — ACETAMINOPHEN 650 MG RE SUPP: 650.0000 mg | Freq: Four times a day (QID) | RECTAL | Status: DC | PRN

## 2015-10-17 MED ORDER — ASPIRIN 325 MG PO TABS: 81.0000 mg | ORAL_TABLET | Freq: Every day | ORAL | Status: DC

## 2015-10-17 MED ORDER — VANCOMYCIN HCL IN DEXTROSE 1-5 GM/200ML-% IV SOLN
INTRAVENOUS | Status: AC
  Filled 2015-10-17: qty 200

## 2015-10-17 MED ORDER — HEPARIN SODIUM (PORCINE) 5000 UNIT/ML IJ SOLN
5000.0000 [IU] | Freq: Three times a day (TID) | INTRAMUSCULAR | Status: DC
  Administered 2015-10-17 – 2015-10-20 (×8): 5000 [IU] via SUBCUTANEOUS
  Filled 2015-10-17 (×11): qty 1

## 2015-10-17 MED ORDER — LEVOTHYROXINE SODIUM 100 MCG PO TABS
100.0000 ug | ORAL_TABLET | Freq: Every day | ORAL | Status: DC
  Administered 2015-10-18 – 2015-10-21 (×4): 100 ug via ORAL
  Filled 2015-10-17 (×4): qty 1

## 2015-10-17 MED ORDER — SODIUM CHLORIDE 0.9 % IJ SOLN
3.0000 mL | Freq: Two times a day (BID) | INTRAMUSCULAR | Status: DC
  Administered 2015-10-17: 3 mL via INTRAVENOUS

## 2015-10-17 MED ORDER — COLESEVELAM HCL 625 MG PO TABS: 1875.0000 mg | ORAL_TABLET | Freq: Two times a day (BID) | ORAL | Status: DC

## 2015-10-17 MED ORDER — DEXTROSE 5 % IV SOLN
2.0000 g | Freq: Two times a day (BID) | INTRAVENOUS | Status: DC
  Administered 2015-10-18 (×2): 2 g via INTRAVENOUS
  Filled 2015-10-17 (×2): qty 2

## 2015-10-17 MED ORDER — VANCOMYCIN HCL IN DEXTROSE 1-5 GM/200ML-% IV SOLN
1000.0000 mg | INTRAVENOUS | Status: AC
  Administered 2015-10-17: 1000 mg via INTRAVENOUS

## 2015-10-17 MED ORDER — ACETAMINOPHEN 325 MG PO TABS
650.0000 mg | ORAL_TABLET | Freq: Once | ORAL | Status: AC | PRN
  Administered 2015-10-17: 650 mg via ORAL
  Filled 2015-10-17: qty 2

## 2015-10-17 MED ORDER — ASPIRIN EC 81 MG PO TBEC
81.0000 mg | DELAYED_RELEASE_TABLET | Freq: Every day | ORAL | Status: DC
  Administered 2015-10-19 – 2015-10-21 (×3): 81 mg via ORAL
  Filled 2015-10-17 (×3): qty 1

## 2015-10-17 MED ORDER — SODIUM CHLORIDE 0.9 % IV SOLN
INTRAVENOUS | Status: DC
  Administered 2015-10-17: 19:00:00 via INTRAVENOUS
  Administered 2015-10-18: 100 mL/h via INTRAVENOUS
  Administered 2015-10-18 – 2015-10-19 (×2): via INTRAVENOUS

## 2015-10-17 MED ORDER — DEXTROSE 5 % IV SOLN
2.0000 g | Freq: Once | INTRAVENOUS | Status: AC
  Administered 2015-10-17: 2 g via INTRAVENOUS
  Filled 2015-10-17: qty 2

## 2015-10-17 MED ORDER — ONDANSETRON HCL 4 MG/2ML IJ SOLN
4.0000 mg | Freq: Four times a day (QID) | INTRAMUSCULAR | Status: DC | PRN
  Administered 2015-10-18: 4 mg via INTRAVENOUS
  Filled 2015-10-17: qty 2

## 2015-10-17 NOTE — H&P (Signed)
[facility] History and Physical  Patient: Erica [name]  MRN: [id number]  DOB: Dec 07, 1940  DOS: the patient was seen and examined on 10/17/2015 PCP: Mayra Neer, MD  Referring physician: Dr. Regenia Skeeter Chief Complaint: Right-sided back pain and fatigue with fever  HPI: Erica [name] is a 74 y.o. female with Past medical history of coronary artery disease, hypertension, hypothyroidism. The patient presents with complaints of 9 days off fever with chills and generalized malaise. Patient was significantly tired and fatigued on initial 2 day and has been sleeping for the whole day. Patient called her PCP and she was prescribed Tamiflu. Despite taking that she did not improve and therefore decided to come to the hospital. Patient was found to be hypertensive. In the admission and was recommended for admission. Patient denies any recent change in her medication no sick contact of nausea and vomiting no diarrhea. Patient also denies any urinary symptoms. Patient complains of right-sided flank pain whenever her fever occurs.  The patient is coming from home.  At her baseline ambulates without support And is independent for most of her ADL; manages her medication on her own.  Review of Systems: as mentioned in the history of present illness.  A comprehensive review of the other systems is negative.  Past Medical History  Diagnosis Date  . Coronary artery disease     status post multiple percutaneous interventions  . Hypertension   . Hyperlipidemia   . Hypothyroidism   . Osteoarthritis    Past Surgical History  Procedure Laterality Date  . Ankle surgery    . 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 Cardiac catheterization  12-2008  . Coronary stent placement  2006     right coronary artery with drug-eluting stent  . Coronary stent placement  1998     bare-metal stent to the right coronary artery  . Coronary stent placement  2008    drug-eluting stent placed in the  left circumflex   Social History:  reports that she has quit smoking. She does not have any smokeless tobacco history on file. She reports that she does not drink alcohol or use illicit drugs.  No Known Allergies  Family History  Problem Relation Age of Onset  . Other Mother 13    accident  . Other      Patient was adopted and has no information about her siblings    Prior to Admission medications   Medication Sig Start Date End Date Taking? Authorizing Provider  Ascorbic Acid (VITAMIN C) 1000 MG tablet Take 2,000 mg by mouth daily.   Yes Historical Provider, MD  aspirin 81 MG tablet Take 1 tablet (81 mg total) by mouth daily. 11/04/12  Yes [name], MD  atenolol (TENORMIN) 25 MG tablet Take 25 mg by mouth daily.     Yes Historical Provider, MD  atorvastatin (LIPITOR) 40 MG tablet Take 40 mg by mouth daily at 6 PM.  11/12/14  Yes Historical Provider, MD  colesevelam (WELCHOL) 625 MG tablet Take 1,875 mg by mouth 2 (two) times daily with a meal.    Yes Historical Provider, MD  ibuprofen (ADVIL,MOTRIN) 200 MG tablet Take 400 mg by mouth every 6 (six) hours as needed for fever, headache or moderate pain.   Yes Historical Provider, MD  levothyroxine (SYNTHROID, LEVOTHROID) 100 MCG tablet Take 100 mcg by mouth daily. 08/01/15  Yes Historical Provider, MD  nitroGLYCERIN (NITROSTAT) 0.4 MG SL tablet Place 1 tablet (0.4 mg total) under  the tongue every 5 (five) minutes as needed for chest pain. 12/02/14  Yes [name], MD  TAMIFLU 75 MG capsule Take 75 mg by mouth 2 (two) times daily. 10/11/15   Historical Provider, MD    Physical Exam: Filed Vitals:   10/17/15 1515 10/17/15 1530 10/17/15 1546 10/17/15 1657  BP: 94/48 99/62 83/42  98/44  Pulse: 67 69 70 66  Temp:      TempSrc:      Resp: 20 15 17 19   Weight:      SpO2: 97% 95% 95% 93%    General: Alert, Awake and Oriented to Time, Place and Person. Appear in mild distress Eyes: PERRL ENT: Oral Mucosa clear moist. Neck: no  JVD Cardiovascular: S1 and S2 Present, no Murmur, Peripheral Pulses Present Respiratory: Bilateral Air entry equal and Decreased,  Clear to Auscultation, no Crackles, no wheezes Abdomen: Bowel Sound present, Soft and no tenderness no CVA tenderness Skin: no Rash Extremities: no Pedal edema, no calf tenderness Neurologic: Grossly no focal neuro deficit.  Labs on Admission:  CBC:  Recent Labs Lab 10/17/15 1503  WBC 17.5*  NEUTROABS 15.4*  HGB 10.1*  HCT 29.7*  MCV 85.6  PLT 303    CMP     Component Value Date/Time   NA 135 10/17/2015 1503   K 3.8 10/17/2015 1503   CL 102 10/17/2015 1503   CO2 21* 10/17/2015 1503   GLUCOSE 147* 10/17/2015 1503   BUN 27* 10/17/2015 1503   CREATININE 1.46* 10/17/2015 1503   CALCIUM 8.2* 10/17/2015 1503   PROT 6.1* 10/17/2015 1503   ALBUMIN 2.6* 10/17/2015 1503   AST 19 10/17/2015 1503   ALT 23 10/17/2015 1503   ALKPHOS 72 10/17/2015 1503   BILITOT 0.7 10/17/2015 1503   GFRNONAA 34* 10/17/2015 1503   GFRAA 40* 10/17/2015 1503    No results for input(s): CKTOTAL, CKMB, CKMBINDEX, TROPONINI in the last 168 hours. BNP (last 3 results) No results for input(s): BNP in the last 8760 hours.  ProBNP (last 3 results) No results for input(s): PROBNP in the last 8760 hours.   Radiological Exams on Admission: Dg Chest 2 View  10/17/2015  CLINICAL DATA:  Fever.  Posterior right chest wall pain. EXAM: CHEST  2 VIEW COMPARISON:  03/16/2015 FINDINGS: Heart size and pulmonary vascularity are normal. Prior CABG. Slight atelectasis at the right lung base laterally. No infiltrates or effusions. No acute osseous abnormality. Chronic thoracic scoliosis. IMPRESSION: Minimal atelectasis at the right lung base. Electronically Signed   By: Lorriane Shire M.D.   On: 10/17/2015 14:06   Assessment/Plan 1. Sepsis [facility]) Patient presents with complaints of fever and chills. Patient had a MAXIMUM TEMPERATURE of 103F. Patient has leukocytosis with left shift.  Lactic acid is normal. Patient was significant antihypertensive here in the hospital. With this the patient will be admitted in stepdown unit. Continue with the vancomycin and ceftriaxone. Follow cultures. Continue with IV hydration.  2. Hypothyroidism. Continuing with Synthroid.  3 change of hypertension. Holding atenolol.  4 dyslipidemia. Continuing with Lipitor.  Nutrition: Regular diet DVT Prophylaxis: subcutaneous Heparin  Advance goals of care discussion: Full code   Family Communication: family was present at bedside, opportunity was given to ask question and all questions were answered satisfactorily at the time of interview. Disposition: Admitted as inpatient, step-down unit.  Author: Berle Mull, MD [facility] Pager: [phone] 10/17/2015  If 7PM-7AM, please contact night-coverage [email or web address] Password [email or web address]

## 2015-10-17 NOTE — ED Notes (Addendum)
Per [facility] pt from PCP, co fever and body aches x 1 week, new onset nausea , vomiting and diarrhea, right flank pain no radiation , denies urinary symptoms. Pt alert and oriented x 4. Pt received 400 cc NS by EMS

## 2015-10-17 NOTE — ED Provider Notes (Signed)
CSN: [id number]     Arrival date & time 10/17/15  1434 History   First MD Initiated Contact with Patient 10/17/15 1502     Chief Complaint  Patient presents with  . Fever     (Consider location/radiation/quality/duration/timing/severity/associated sxs/prior Treatment) HPI  74 year old female presents from her doctor's office with a fever for the past 9 days. She states that every day she's had a fever of at least 101. Patient has been having body aches, especially when the fever spikes, but otherwise no focal pain. Denies headaches, chest pain, or shortness of breath. She has had mildly progressive right flank pain/lower back pain for the past several days. Denies urinary symptoms. Last night had multiple episodes of vomiting and diarrhea, no blood in either.  This is resolved. Went to her doctor today and was sent here. Had a negative chest x-ray by her PCP. Is on tamiflu since 1 week ago.  Past Medical History  Diagnosis Date  . Coronary artery disease     status post multiple percutaneous interventions  . Hypertension   . Hyperlipidemia   . Hypothyroidism   . Osteoarthritis    Past Surgical History  Procedure Laterality Date  . Ankle surgery    . 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 Cardiac catheterization  12-2008  . Coronary stent placement  2006     right coronary artery with drug-eluting stent  . Coronary stent placement  1998     bare-metal stent to the right coronary artery  . Coronary stent placement  2008    drug-eluting stent placed in the left circumflex   Family History  Problem Relation Age of Onset  . Other Mother 99    accident  . Other      Patient was adopted and has no information about her siblings   Social History  Substance Use Topics  . Smoking status: Former Research scientist (life sciences)  . Smokeless tobacco: None  . Alcohol Use: No   OB History    No data available     Review of Systems  Constitutional: Positive for fever and chills.  Respiratory:  Negative for cough and shortness of breath.   Gastrointestinal: Positive for vomiting and diarrhea. Negative for abdominal pain and blood in stool.  Genitourinary: Negative for dysuria.  Musculoskeletal: Positive for back pain.  Neurological: Negative for headaches.  All other systems reviewed and are negative.     Allergies  Review of patient's allergies indicates no known allergies.  Home Medications   Prior to Admission medications   Medication Sig Start Date End Date Taking? Authorizing Provider  aspirin 81 MG tablet Take 1 tablet (81 mg total) by mouth daily. 11/04/12   [name], MD  atenolol (TENORMIN) 25 MG tablet Take 25 mg by mouth daily.      Historical Provider, MD  atorvastatin (LIPITOR) 40 MG tablet Take 40 mg by mouth daily at 6 PM.  11/12/14   Historical Provider, MD  Celecoxib (CELEBREX PO) Take 20 mg by mouth daily.    Historical Provider, MD  colesevelam (WELCHOL) 625 MG tablet Take 1,875 mg by mouth 2 (two) times daily with a meal.     Historical Provider, MD  levothyroxine (SYNTHROID, LEVOTHROID) 88 MCG tablet Take 88 mcg by mouth daily.      Historical Provider, MD  Multiple Vitamin (MULTIVITAMIN) tablet Take 1 tablet by mouth daily.      Historical Provider, MD  nitroGLYCERIN (NITROSTAT) 0.4 MG SL tablet Place  1 tablet (0.4 mg total) under the tongue every 5 (five) minutes as needed for chest pain. 12/02/14   [name], MD   BP 81/21 mmHg  Pulse 73  Temp(Src) 103 F (39.4 C) (Rectal)  Resp 18  Wt 123 lb (55.792 kg)  SpO2 95% Physical Exam  Constitutional: She is oriented to person, place, and time. She appears well-developed and well-nourished.  HENT:  Head: Normocephalic and atraumatic.  Right Ear: External ear normal.  Left Ear: External ear normal.  Nose: Nose normal.  Eyes: Right eye exhibits no discharge. Left eye exhibits no discharge.  Cardiovascular: Normal rate and regular rhythm.   Murmur heard. Pulmonary/Chest: Effort normal and  breath sounds normal.  Abdominal: Soft. She exhibits no distension. There is no tenderness.  Musculoskeletal:       Thoracic back: She exhibits tenderness.       Back:  Neurological: She is alert and oriented to person, place, and time.  Skin: Skin is warm and dry.  Nursing note and vitals reviewed.   ED Course  Procedures (including critical care time) Labs Review Labs Reviewed  COMPREHENSIVE METABOLIC PANEL - Abnormal; Notable for the following:    CO2 21 (*)    Glucose, Bld 147 (*)    BUN 27 (*)    Creatinine, Ser 1.46 (*)    Calcium 8.2 (*)    Total Protein 6.1 (*)    Albumin 2.6 (*)    GFR calc non Af Amer 34 (*)    GFR calc Af Amer 40 (*)    All other components within normal limits  URINALYSIS, ROUTINE W REFLEX MICROSCOPIC (NOT AT [facility]) - Abnormal; Notable for the following:    APPearance TURBID (*)    Hgb urine dipstick MODERATE (*)    Protein, ur 100 (*)    Leukocytes, UA MODERATE (*)    All other components within normal limits  CBC WITH DIFFERENTIAL/PLATELET - Abnormal; Notable for the following:    WBC 17.5 (*)    RBC 3.47 (*)    Hemoglobin 10.1 (*)    HCT 29.7 (*)    Neutro Abs 15.4 (*)    Lymphs Abs 0.5 (*)    Monocytes Absolute 1.6 (*)    All other components within normal limits  URINE MICROSCOPIC-ADD ON - Abnormal; Notable for the following:    Squamous Epithelial / LPF FEW (*)    Bacteria, UA MANY (*)    All other components within normal limits  MRSA PCR SCREENING  CULTURE, BLOOD (ROUTINE X 2)  CULTURE, BLOOD (ROUTINE X 2)  URINE CULTURE  COMPREHENSIVE METABOLIC PANEL  CBC  PROTIME-INR  I-STAT CG4 LACTIC ACID, ED    Imaging Review Dg Chest 2 View  10/17/2015  CLINICAL DATA:  Fever.  Posterior right chest wall pain. EXAM: CHEST  2 VIEW COMPARISON:  03/16/2015 FINDINGS: Heart size and pulmonary vascularity are normal. Prior CABG. Slight atelectasis at the right lung base laterally. No infiltrates or effusions. No acute osseous abnormality.  Chronic thoracic scoliosis. IMPRESSION: Minimal atelectasis at the right lung base. Electronically Signed   By: Lorriane Shire M.D.   On: 10/17/2015 14:06   I have personally reviewed and evaluated these images and lab results as part of my medical decision-making.   EKG Interpretation   Date/Time:  Monday October 17 2015 15:11:08 EST Ventricular Rate:  71 PR Interval:  164 QRS Duration: 104 QT Interval:  411 QTC Calculation: 447 R Axis:   69 Text Interpretation:  Sinus  rhythm Nonspecific repol abnormality, diffuse  leads changes noted comapred to 2011 Confirmed by Quinesha Selinger  MD, Kenry Daubert  ([id number]) on 10/17/2015 3:31:02 PM      CRITICAL CARE Performed by: Sherwood Gambler T   Total critical care time: 40 minutes  Critical care time was exclusive of separately billable procedures and treating other patients.  Critical care was necessary to treat or prevent imminent or life-threatening deterioration.  Critical care was time spent personally by me on the following activities: development of treatment plan with patient and/or surrogate as well as nursing, discussions with consultants, evaluation of patient's response to treatment, examination of patient, obtaining history from patient or surrogate, ordering and performing treatments and interventions, ordering and review of laboratory studies, ordering and review of radiographic studies, pulse oximetry and re-evaluation of patient's condition.  MDM   Final diagnoses:  Right flank pain    Unclear with the patient's fever is coming from. She does have flank pain but denies any urinary symptoms. However her urine has many bacteria and moderate leukocytes. She was given Rocephin for this. She also has a soft murmur that appears to be new. Given the length of symptoms of fever, 9 straight days, I feel that she should be covered for endocarditis until this is ruled out. 3 blood cultures were obtained before antibiotics. She was also given  vancomycin. Patient's blood pressure has remained soft despite adequate fluid resuscitation although she is not symptomatic. Lactate unremarkable. Admit to the hospitalist in the step down unit.    Sherwood Gambler, MD 10/18/15 [phone]

## 2015-10-17 NOTE — Progress Notes (Addendum)
ANTIBIOTIC CONSULT NOTE - INITIAL  Pharmacy Consult for Vancomycin Indication: sepsis, ? Endocarditis, ? UTI  No Known Allergies  Patient Measurements: Weight: 123 lb (55.792 kg) Height: 5\' 2"   Vital Signs: Temp: 103 F (39.4 C) (11/14 1501) Temp Source: Rectal (11/14 1501) BP: 98/44 mmHg (11/14 1657) Pulse Rate: 66 (11/14 1657) Intake/Output from previous day:   Intake/Output from this shift:    Labs:  Recent Labs  10/17/15 1503  WBC 17.5*  HGB 10.1*  PLT 303  CREATININE 1.46*   Estimated Creatinine Clearance: 26.7 mL/min (by C-G formula based on Cr of 1.46). No results for input(s): VANCOTROUGH, VANCOPEAK, VANCORANDOM, GENTTROUGH, GENTPEAK, GENTRANDOM, TOBRATROUGH, TOBRAPEAK, TOBRARND, AMIKACINPEAK, AMIKACINTROU, AMIKACIN in the last 72 hours.   Microbiology: No results found for this or any previous visit (from the past 720 hour(s)).  Medical History: Past Medical History  Diagnosis Date  . Coronary artery disease     status post multiple percutaneous interventions  . Hypertension   . Hyperlipidemia   . Hypothyroidism   . Osteoarthritis     Assessment: 35 y/oF with PMH of CAD, HTN, HLD, hypothyroidism, OA who presents with fever x 9 days, body aches, n/v, diarrhea, and R flank pain. Per EDP, heart murmur heard on physical exam. UA turbid with many bacteria, nitrite negative, moderate leukocytes, TNTC WBC. Code sepsis initiated in ED. Ceftriaxone 2g IV x 1 given in ED, and pharmacy consulted to assist with dosing of Vancomycin for sepsis, ? endocarditis, ? UTI.   11/14 >> Ceftriaxone x 1 11/14 >> Vancomycin >>    11/14 blood: sent 11/14 urine: sent   Tmax: 103F WBC elevated at 17.5K SCr 1.46 with CrCl ~ 27 ml/min CG Lactic Acid: 1.49  Goal of Therapy:  Vancomycin trough level 15-20 mcg/ml  Appropriate antibiotic dosing for renal function Eradication of infection  Plan:   Vancomycin 1g IV x 1 in ED, then 500mg  IV q24h.  Plan for Vancomycin  trough level at steady state.  Monitor renal function, cultures, clinical course.   Lindell Spar, PharmD, BCPS Pager: [phone] 10/17/2015 4:24 PM  Addendum:  Admitting provider also added Ceftriaxone to be continued on admission.   Ceftriaxone 2g IV q12h  Can decrease to 1g IV q24h if endocarditis ruled out and treating UTI only.   Lindell Spar, PharmD, BCPS Pager: [phone] 10/17/2015 7:45 PM

## 2015-10-18 ENCOUNTER — Inpatient Hospital Stay (HOSPITAL_COMMUNITY): Payer: Commercial Managed Care - HMO | Admitting: Anesthesiology

## 2015-10-18 ENCOUNTER — Inpatient Hospital Stay (HOSPITAL_COMMUNITY): Payer: Commercial Managed Care - HMO

## 2015-10-18 ENCOUNTER — Encounter (HOSPITAL_COMMUNITY): Payer: Self-pay | Admitting: Urology

## 2015-10-18 ENCOUNTER — Encounter (HOSPITAL_COMMUNITY)
Admission: EM | Disposition: A | Discharge status: Home / Self Care | Payer: Self-pay | Source: Home / Self Care | Attending: Internal Medicine

## 2015-10-18 DIAGNOSIS — I5189 Other ill-defined heart diseases: Secondary | ICD-10-CM | POA: Diagnosis present

## 2015-10-18 DIAGNOSIS — N179 Acute kidney failure, unspecified: Secondary | ICD-10-CM | POA: Diagnosis present

## 2015-10-18 DIAGNOSIS — N139 Obstructive and reflux uropathy, unspecified: Secondary | ICD-10-CM | POA: Diagnosis present

## 2015-10-18 DIAGNOSIS — A4150 Gram-negative sepsis, unspecified: Secondary | ICD-10-CM | POA: Diagnosis present

## 2015-10-18 DIAGNOSIS — R0989 Other specified symptoms and signs involving the circulatory and respiratory systems: Secondary | ICD-10-CM | POA: Diagnosis present

## 2015-10-18 DIAGNOSIS — R011 Cardiac murmur, unspecified: Secondary | ICD-10-CM

## 2015-10-18 DIAGNOSIS — N390 Urinary tract infection, site not specified: Secondary | ICD-10-CM | POA: Diagnosis present

## 2015-10-18 HISTORY — PX: CYSTOSCOPY WITH RETROGRADE PYELOGRAM, URETEROSCOPY AND STENT PLACEMENT: SHX5789

## 2015-10-18 LAB — CBC
HCT: 28.4 % — ABNORMAL LOW (ref 36.0–46.0)
Hemoglobin: 9.4 g/dL — ABNORMAL LOW (ref 12.0–15.0)
MCH: 29.3 pg (ref 26.0–34.0)
MCHC: 33.1 g/dL (ref 30.0–36.0)
MCV: 88.5 fL (ref 78.0–100.0)
PLATELETS: 298 10*3/uL (ref 150–400)
RBC: 3.21 MIL/uL — ABNORMAL LOW (ref 3.87–5.11)
RDW: 14 % (ref 11.5–15.5)
WBC: 14.7 10*3/uL — AB (ref 4.0–10.5)

## 2015-10-18 LAB — COMPREHENSIVE METABOLIC PANEL
ALK PHOS: 65 U/L (ref 38–126)
ALT: 22 U/L (ref 14–54)
AST: 20 U/L (ref 15–41)
Albumin: 2.3 g/dL — ABNORMAL LOW (ref 3.5–5.0)
Anion gap: 7 (ref 5–15)
BILIRUBIN TOTAL: 0.7 mg/dL (ref 0.3–1.2)
BUN: 21 mg/dL — AB (ref 6–20)
CALCIUM: 7.8 mg/dL — AB (ref 8.9–10.3)
CO2: 20 mmol/L — ABNORMAL LOW (ref 22–32)
CREATININE: 1.07 mg/dL — AB (ref 0.44–1.00)
Chloride: 108 mmol/L (ref 101–111)
GFR, EST AFRICAN AMERICAN: 58 mL/min — AB (ref >60)
GFR, EST NON AFRICAN AMERICAN: 50 mL/min — AB (ref >60)
Glucose, Bld: 158 mg/dL — ABNORMAL HIGH (ref 65–99)
Potassium: 3.7 mmol/L (ref 3.5–5.1)
Sodium: 135 mmol/L (ref 135–145)
TOTAL PROTEIN: 5.3 g/dL — AB (ref 6.5–8.1)

## 2015-10-18 LAB — PROTIME-INR
INR: 1.33 (ref 0.00–1.49)
PROTHROMBIN TIME: 16.6 s — AB (ref 11.6–15.2)

## 2015-10-18 SURGERY — CYSTOURETEROSCOPY, WITH RETROGRADE PYELOGRAM AND STENT INSERTION
Anesthesia: General | Laterality: Right

## 2015-10-18 MED ORDER — LIDOCAINE HCL (CARDIAC) 20 MG/ML IV SOLN
INTRAVENOUS | Status: DC | PRN
  Administered 2015-10-18: 60 mg via INTRAVENOUS

## 2015-10-18 MED ORDER — PROMETHAZINE HCL 25 MG/ML IJ SOLN: 6.2500 mg | INTRAMUSCULAR | Status: DC | PRN

## 2015-10-18 MED ORDER — STERILE WATER FOR IRRIGATION IR SOLN
Status: DC | PRN
  Administered 2015-10-18: 3000 mL

## 2015-10-18 MED ORDER — PROPOFOL 10 MG/ML IV BOLUS
INTRAVENOUS | Status: DC | PRN
Start: 2015-10-18 — End: 2015-10-18
  Administered 2015-10-18: 20 mg via INTRAVENOUS

## 2015-10-18 MED ORDER — FENTANYL CITRATE (PF) 100 MCG/2ML IJ SOLN: 25.0000 ug | INTRAMUSCULAR | Status: DC | PRN

## 2015-10-18 MED ORDER — ONDANSETRON HCL 4 MG/2ML IJ SOLN
INTRAMUSCULAR | Status: AC
  Filled 2015-10-18: qty 2

## 2015-10-18 MED ORDER — ONDANSETRON HCL 4 MG/2ML IJ SOLN
INTRAMUSCULAR | Status: DC | PRN
  Administered 2015-10-18: 4 mg via INTRAVENOUS

## 2015-10-18 MED ORDER — IOHEXOL 350 MG/ML SOLN
INTRAVENOUS | Status: DC | PRN
  Administered 2015-10-18: 6 mL

## 2015-10-18 MED ORDER — FENTANYL CITRATE (PF) 100 MCG/2ML IJ SOLN
INTRAMUSCULAR | Status: DC | PRN
  Administered 2015-10-18: 50 ug via INTRAVENOUS

## 2015-10-18 MED ORDER — EPHEDRINE SULFATE 50 MG/ML IJ SOLN
INTRAMUSCULAR | Status: DC | PRN
  Administered 2015-10-18: 10 mg via INTRAVENOUS
  Administered 2015-10-18: 5 mg via INTRAVENOUS

## 2015-10-18 MED ORDER — LIDOCAINE HCL (CARDIAC) 20 MG/ML IV SOLN
INTRAVENOUS | Status: AC
  Filled 2015-10-18: qty 5

## 2015-10-18 MED ORDER — SODIUM CHLORIDE 0.9 % IJ SOLN
INTRAMUSCULAR | Status: AC
  Filled 2015-10-18: qty 10

## 2015-10-18 MED ORDER — FENTANYL CITRATE (PF) 100 MCG/2ML IJ SOLN
INTRAMUSCULAR | Status: AC
  Filled 2015-10-18: qty 4

## 2015-10-18 MED ORDER — VITAMINS A & D EX OINT
TOPICAL_OINTMENT | CUTANEOUS | Status: AC
  Administered 2015-10-18: 5
  Filled 2015-10-18: qty 5

## 2015-10-18 MED ORDER — VANCOMYCIN HCL 500 MG IV SOLR
500.0000 mg | Freq: Two times a day (BID) | INTRAVENOUS | Status: DC
  Administered 2015-10-18: 500 mg via INTRAVENOUS
  Filled 2015-10-18 (×2): qty 500

## 2015-10-18 MED ORDER — MEPERIDINE HCL 25 MG/ML IJ SOLN: 6.2500 mg | INTRAMUSCULAR | Status: DC | PRN

## 2015-10-18 MED ORDER — LACTATED RINGERS IV SOLN
INTRAVENOUS | Status: DC | PRN
  Administered 2015-10-18: 06:00:00 via INTRAVENOUS

## 2015-10-18 MED ORDER — DEXTROSE 5 % IV SOLN
1.0000 g | INTRAVENOUS | Status: DC
  Administered 2015-10-19 – 2015-10-20 (×2): 1 g via INTRAVENOUS
  Filled 2015-10-18 (×3): qty 10

## 2015-10-18 MED ORDER — EPHEDRINE SULFATE 50 MG/ML IJ SOLN
INTRAMUSCULAR | Status: AC
  Filled 2015-10-18: qty 1

## 2015-10-18 MED ORDER — PROPOFOL 10 MG/ML IV BOLUS
INTRAVENOUS | Status: AC
  Filled 2015-10-18: qty 20

## 2015-10-18 SURGICAL SUPPLY — 32 items
BAG URINE DRAINAGE (UROLOGICAL SUPPLIES) ×1 IMPLANT
BAG URO CATCHER STRL LF (DRAPE) ×2 IMPLANT
BASKET LASER NITINOL 1.9FR (BASKET) IMPLANT
BASKET STNLS GEMINI 4WIRE 3FR (BASKET) IMPLANT
BASKET ZERO TIP NITINOL 2.4FR (BASKET) IMPLANT
BRUSH URET BIOPSY 3F (UROLOGICAL SUPPLIES) IMPLANT
BSKT STON RTRVL 120 1.9FR (BASKET)
BSKT STON RTRVL GEM 120X11 3FR (BASKET)
BSKT STON RTRVL ZERO TP 2.4FR (BASKET)
CATH FOLEY 2WAY SLVR  5CC 16FR (CATHETERS) ×1
CATH FOLEY 2WAY SLVR 5CC 16FR (CATHETERS) IMPLANT
CATH INTERMIT  6FR 70CM (CATHETERS) IMPLANT
CLOTH BEACON ORANGE TIMEOUT ST (SAFETY) ×2 IMPLANT
FIBER LASER FLEXIVA 1000 (UROLOGICAL SUPPLIES) IMPLANT
FIBER LASER FLEXIVA 200 (UROLOGICAL SUPPLIES) IMPLANT
FIBER LASER FLEXIVA 365 (UROLOGICAL SUPPLIES) IMPLANT
FIBER LASER FLEXIVA 550 (UROLOGICAL SUPPLIES) IMPLANT
FIBER LASER TRAC TIP (UROLOGICAL SUPPLIES) IMPLANT
GLOVE BIOGEL M STRL SZ7.5 (GLOVE) ×2 IMPLANT
GOWN STRL REUS W/TWL XL LVL3 (GOWN DISPOSABLE) ×2 IMPLANT
GUIDEWIRE ANG ZIPWIRE 038X150 (WIRE) IMPLANT
GUIDEWIRE STR DUAL SENSOR (WIRE) IMPLANT
IV NS IRRIG 3000ML ARTHROMATIC (IV SOLUTION) ×4 IMPLANT
KIT BALLIN UROMAX 15FX10 (LABEL) IMPLANT
KIT BALLN UROMAX 15FX4 (MISCELLANEOUS) IMPLANT
KIT BALLN UROMAX 26 75X4 (MISCELLANEOUS)
PACK CYSTO (CUSTOM PROCEDURE TRAY) ×4 IMPLANT
SET HIGH PRES BAL DIL (LABEL)
SHEATH ACCESS URETERAL 24CM (SHEATH) IMPLANT
SHEATH ACCESS URETERAL 54CM (SHEATH) IMPLANT
STENT URET 6FRX24 CONTOUR (STENTS) ×1 IMPLANT
SYRINGE IRR TOOMEY STRL 70CC (SYRINGE) IMPLANT

## 2015-10-18 NOTE — Transfer of Care (Signed)
Immediate Anesthesia Transfer of Care Note  Patient: Erica [name]  Procedure(s) Performed: Procedure(s): CYSTOSCOPY WITH RETROGRADE PYELOGRAM,  AND STENT PLACEMENT (Right)  Patient Location: PACU  Anesthesia Type:General  Level of Consciousness: Alert, oriented and cooperative  Airway & Oxygen Therapy: Patient Spontanous Breathing and Patient connected to face mask oxygen  Post-op Assessment: Report given to RN, Post -op Vital signs reviewed and stable and Patient moving all extremities  Post vital signs: Reviewed and stable  Last Vitals:  Filed Vitals:   10/18/15 0400  BP: 111/37  Pulse: 66  Temp: 36.9 C  Resp: 22    Complications: No apparent anesthesia complications

## 2015-10-18 NOTE — Progress Notes (Signed)
Attempted report x1. 

## 2015-10-18 NOTE — Progress Notes (Signed)
ANTIBIOTIC CONSULT NOTE -   Pharmacy Consult for Ceftriaxone Indication: UTI  No Known Allergies  Patient Measurements: Height: 5\' 2"  (157.5 cm) Weight: 136 lb 14.5 oz (62.1 kg) IBW/kg (Calculated) : 50.1  Vital Signs: Temp: 98.3 F (36.8 C) (11/15 1549) Temp Source: Oral (11/15 1549) BP: 113/55 mmHg (11/15 1601) Pulse Rate: 52 (11/15 1601) Intake/Output from previous day: 11/14 0701 - 11/15 0700 In: 1200 [I.V.:1100; IV Piggyback:100] Out: 950 [Urine:950] Intake/Output from this shift: Total I/O In: 1000 [I.V.:1000] Out: 30 [Urine:30]  Labs:  Recent Labs  10/17/15 1503 10/18/15 0410  WBC 17.5* 14.7*  HGB 10.1* 9.4*  PLT 303 298  CREATININE 1.46* 1.07*   Estimated Creatinine Clearance: 40 mL/min (by C-G formula based on Cr of 1.07). No results for input(s): VANCOTROUGH, VANCOPEAK, VANCORANDOM, GENTTROUGH, GENTPEAK, GENTRANDOM, TOBRATROUGH, TOBRAPEAK, TOBRARND, AMIKACINPEAK, AMIKACINTROU, AMIKACIN in the last 72 hours.   Microbiology: Recent Results (from the past 720 hour(s))  Culture, blood (routine x 2)     Status: None (Preliminary result)   Collection Time: 10/17/15  3:05 PM  Result Value Ref Range Status   Specimen Description BLOOD RIGHT ARM  Final   Special Requests BOTTLES DRAWN AEROBIC AND ANAEROBIC 5CC  Final   Culture   Final    NO GROWTH < 24 HOURS Performed at [facility]    Report Status PENDING  Incomplete  Culture, blood (routine x 2)     Status: None (Preliminary result)   Collection Time: 10/17/15  3:20 PM  Result Value Ref Range Status   Specimen Description BLOOD LEFT WRIST  Final   Special Requests BOTTLES DRAWN AEROBIC AND ANAEROBIC 4CC  Final   Culture   Final    NO GROWTH < 24 HOURS Performed at [facility]    Report Status PENDING  Incomplete  Urine culture     Status: None (Preliminary result)   Collection Time: 10/17/15  3:47 PM  Result Value Ref Range Status   Specimen Description URINE, CLEAN CATCH  Final   Special Requests NONE  Final   Culture   Final    >=100,000 COLONIES/mL GRAM NEGATIVE RODS Performed at [facility]    Report Status PENDING  Incomplete  MRSA PCR Screening     Status: None   Collection Time: 10/17/15  7:06 PM  Result Value Ref Range Status   MRSA by PCR NEGATIVE NEGATIVE Final    Comment:        The GeneXpert MRSA Assay (FDA approved for NASAL specimens only), is one component of a comprehensive MRSA colonization surveillance program. It is not intended to diagnose MRSA infection nor to guide or monitor treatment for MRSA infections.     Medical History: Past Medical History  Diagnosis Date  . Coronary artery disease     status post multiple percutaneous interventions  . Hypertension   . Hyperlipidemia   . Hypothyroidism   . Osteoarthritis     Assessment: 30 y/oF with PMH of CAD, HTN, HLD, hypothyroidism, OA who presents with fever x 9 days, body aches, n/v, diarrhea, and R flank pain. Per EDP, heart murmur heard on physical exam. UA turbid with many bacteria, nitrite negative, moderate leukocytes, TNTC WBC. Code sepsis initiated in ED. Ceftriaxone 2g IV x 1 given in ED, and pharmacy consulted to assist with dosing of Vancomycin for sepsis, ? endocarditis, ? UTI.   11/14 >> Ceftriaxone >> 11/14 >> Vancomycin >>   11/15  11/14 blood: NG to date 11/14 urine:  GNR   Tmax: fever curve improving WBC elevated but improving SCr 1.07 (improved) with CrCl ~ 82ml/min CG Lactic Acid: 1.49  PM Update  Blood culture no growth to date --> MD has d/c'ed Vanc  Urine culture showing GNR ---> MD has requested pharmacy dose Ceftriaxone for UTI  Goal of Therapy:  Appropriate antibiotic dosing for renal function Eradication of infection  Plan:   Adjust Ceftriaxone to 1gm IV q24h for treatment of UTI  Monitor cultures, clinical course.  Further Ceftriaxone dosage adjustment appears unlikely at present.  Will sign off at this time.  Please reconsult  if a change in clinical status warrants re-evaluation of dosage.   Leone Haven, PharmD  10/18/2015 5:45 PM

## 2015-10-18 NOTE — Progress Notes (Signed)
Called son to update and no answer. Mailbox full.

## 2015-10-18 NOTE — Progress Notes (Signed)
[facility] Progress Note    Patient: Erica [name]  [id number]  DOB: [birth year]  DOA: 10/17/2015 Date of Service: the patient was seen and examined on 10/18/2015  Subjective: Overnight the patient was found to be having obstructive uropathy and underwent ureteric stent placement. Currently the patient does not have any acute complaint is not having any chest pain and abdominal pain dizziness or lightheadedness. Blood pressure is improved and stabilized Nutrition: Able to tolerate diet Activity: Ambulating in the room Last BM: 10/17/2015  Brief Summary of Hospitalization: Day 1 of admission The patient is presenting with complaints of generalized fatigue, fever, hypertension. Patient was having acute kidney injury and leukocytosis. CT of the abdomen was positive for obstructive uropathy. Urology was consulted and the patient has undergone cystoscopy with right ureteral stent placement on 10/18/2015.  Assessment and Plan 1. Gram negative sepsis [facility]) Patient presented with temperature of 103F, leukocytosis with left shift, hypotensive, CT did not was positive for obstructive uropathy. Patient underwent right ureteral stent placement with cystoscopy. We will narrow down antibiotics to IV ceftriaxone. Cultures are negative in blood, urine culture shows more than 100,000 gram-negative bacilli. Continue IV fluids Continue to hold antihypertensive medications  2. Essential hypertension Continue to hold antihypertensive medications  3  CAD, NATIVE VESSEL Continue aspirin  4  AKI (acute kidney injury) ([place]) At present improved significantly. Most like is secondary to obstetric uropathy. We'll continue to IV hydrate. Pharmacy monitoring for antibiotic dose as per renal function  5  Abdominal bruit Calcified abdominal aorta no aneurysm Echocardiogram does not show any acute valvular disease.  6  Diastolic dysfunction Aortic murmur New-onset of murmur. Echocardiogram  negative for any vegetation. Echogram shows grade 2 diastolic dysfunction Monitor for volume overload  DVT Prophylaxis: subcutaneous Heparin Nutrition: Advance to heart healthy diet  Advance goals of care discussion: Full code  Family Communication: family was present at bedside, opportunity was given to ask question and all questions were answered satisfactorily at the time of interview.  Disposition:  Discharge in 1-2 days pending cultures, to most likely home.  Consultants: Urology Procedures: Cystourethroscopy, Right ureteral stent placement, 10/18/2015 Antibiotics: Vancomycin 10/17/2015 start date,stop date 10/18/2015 Ceftriaxone 10/17/2015 start date   Intake/Output Summary (Last 24 hours) at 10/18/15 1736 Last data filed at 10/18/15 0745  Gross per 24 hour  Intake   2200 ml  Output    980 ml  Net   1220 ml   Filed Weights   10/17/15 1508 10/17/15 1834 10/18/15 0500  Weight: 55.792 kg (123 lb) 58.5 kg (128 lb 15.5 oz) 62.1 kg (136 lb 14.5 oz)    Objective: Physical Exam: Filed Vitals:   10/18/15 1219 10/18/15 1544 10/18/15 1549 10/18/15 1601  BP:    113/55  Pulse:    52  Temp: 98.3 F (36.8 C) 97.9 F (36.6 C) 98.3 F (36.8 C)   TempSrc: Oral Oral Oral   Resp:    23  Height:      Weight:      SpO2:    87%     General: Appear in mild distress ENT: Oral Mucosa moist. Cardiovascular: S1 and S2 Present, aortic Murmur Respiratory: Bilateral Air entry presnt and Clear to Auscultation, no Crackles, no wheezes Abdomen: Bowel Sound present, Soft and no tenderness Skin: no Rash Extremities: no Pedal edema, no calf tenderness Neurologic: Grossly no focal neuro deficit.  Data Reviewed: CBC:  Recent Labs Lab 10/17/15 1503 10/18/15 0410  WBC 17.5* 14.7*  NEUTROABS 15.4*  --  HGB 10.1* 9.4*  HCT 29.7* 28.4*  MCV 85.6 88.5  PLT 303 [id number]   Basic Metabolic Panel:  Recent Labs Lab 10/17/15 1503 10/18/15 0410  NA 135 135  K 3.8 3.7  CL 102 108  CO2  21* 20*  GLUCOSE 147* 158*  BUN 27* 21*  CREATININE 1.46* 1.07*  CALCIUM 8.2* 7.8*   Liver Function Tests:  Recent Labs Lab 10/17/15 1503 10/18/15 0410  AST 19 20  ALT 23 22  ALKPHOS 72 65  BILITOT 0.7 0.7  PROT 6.1* 5.3*  ALBUMIN 2.6* 2.3*   No results for input(s): LIPASE, AMYLASE in the last 168 hours. No results for input(s): AMMONIA in the last 168 hours.  Cardiac Enzymes: No results for input(s): CKTOTAL, CKMB, CKMBINDEX, TROPONINI in the last 168 hours. BNP (last 3 results) No results for input(s): BNP in the last 8760 hours.  ProBNP (last 3 results) No results for input(s): PROBNP in the last 8760 hours.  CBG: No results for input(s): GLUCAP in the last 168 hours.  Recent Results (from the past 240 hour(s))  Culture, blood (routine x 2)     Status: None (Preliminary result)   Collection Time: 10/17/15  3:05 PM  Result Value Ref Range Status   Specimen Description BLOOD RIGHT ARM  Final   Special Requests BOTTLES DRAWN AEROBIC AND ANAEROBIC 5CC  Final   Culture   Final    NO GROWTH < 24 HOURS Performed at [facility]    Report Status PENDING  Incomplete  Culture, blood (routine x 2)     Status: None (Preliminary result)   Collection Time: 10/17/15  3:20 PM  Result Value Ref Range Status   Specimen Description BLOOD LEFT WRIST  Final   Special Requests BOTTLES DRAWN AEROBIC AND ANAEROBIC 4CC  Final   Culture   Final    NO GROWTH < 24 HOURS Performed at [facility]    Report Status PENDING  Incomplete  Urine culture     Status: None (Preliminary result)   Collection Time: 10/17/15  3:47 PM  Result Value Ref Range Status   Specimen Description URINE, CLEAN CATCH  Final   Special Requests NONE  Final   Culture   Final    >=100,000 COLONIES/mL GRAM NEGATIVE RODS Performed at [facility]    Report Status PENDING  Incomplete  MRSA PCR Screening     Status: None   Collection Time: 10/17/15  7:06 PM  Result Value Ref Range Status    MRSA by PCR NEGATIVE NEGATIVE Final    Comment:        The GeneXpert MRSA Assay (FDA approved for NASAL specimens only), is one component of a comprehensive MRSA colonization surveillance program. It is not intended to diagnose MRSA infection nor to guide or monitor treatment for MRSA infections.      Studies: Dg Chest 2 View  10/17/2015  CLINICAL DATA:  Fever.  Posterior right chest wall pain. EXAM: CHEST  2 VIEW COMPARISON:  03/16/2015 FINDINGS: Heart size and pulmonary vascularity are normal. Prior CABG. Slight atelectasis at the right lung base laterally. No infiltrates or effusions. No acute osseous abnormality. Chronic thoracic scoliosis. IMPRESSION: Minimal atelectasis at the right lung base. Electronically Signed   By: Lorriane Shire M.D.   On: 10/17/2015 14:06   Ct Renal Stone Study  10/17/2015  CLINICAL DATA:  Right flank pain.  Nausea and vomiting. EXAM: CT ABDOMEN AND PELVIS WITHOUT CONTRAST TECHNIQUE: Multidetector CT imaging of  the abdomen and pelvis was performed following the standard protocol without IV contrast. COMPARISON:  None FINDINGS: Lower chest: Small bilateral pleural effusions and mild interstitial prominence is noted. RCA coronary artery calcification and thoracic aortic atherosclerotic calcifications noted. Hepatobiliary: No suspicious liver abnormality identified. The gallbladder appears normal. No biliary dilatation. Pancreas: Normal appearance of the pancreas. Spleen: The spleen is negative. Adrenals/Urinary Tract: The adrenal glands are negative. There is right-sided nephro megaly, perinephric fat stranding and hydronephrosis. Stone within the inferior pole of right kidney measures 4 mm. Right-sided hydroureter and periureteral fat stranding noted. Distal right ureteral calculus measures 5 mm. No stone identified within the urinary bladder. Left kidney is normal. Stomach/Bowel: The stomach is within normal limits. The small bowel loops have a normal course and  caliber. No obstruction. Normal appearance of the colon. Vascular/Lymphatic: Calcified atherosclerotic disease involves the abdominal aorta. No aneurysm. No enlarged retroperitoneal or mesenteric adenopathy. No enlarged pelvic or inguinal lymph nodes. Reproductive: Uterus in the adnexal structures have a normal appearance for patient's age. Other: Small amount of free fluid identified within the dependent portion of the pelvis. Musculoskeletal: Degenerative disc disease noted within the lumbar spine. This is most advanced at T12-L1. There is an anterolisthesis of L4 on L5 noted. IMPRESSION: 1. Right-sided hydronephrosis and hydroureter secondary to 5 mm distal ureteral calculus. 2. Aortic atherosclerosis as well as coronary artery calcification. 3. Small amount of pleural fluid and interstitial edema noted within the lung bases. Correlate for any clinical signs or symptoms of CHF. Electronically Signed   By: Kerby Moors M.D.   On: 10/17/2015 19:22    Scheduled Meds: . aspirin EC  81 mg Oral Daily  . atorvastatin  40 mg Oral q1800  . cefTRIAXone (ROCEPHIN)  IV  2 g Intravenous Q12H  . heparin  5,000 Units Subcutaneous 3 times per day  . levothyroxine  100 mcg Oral QAC breakfast  . sodium chloride  3 mL Intravenous Q12H  . vancomycin  500 mg Intravenous BID   Continuous Infusions: . sodium chloride 100 mL/hr at 10/18/15 0800    Time spent: 20 minutes.  Author: Berle Mull, MD [facility] Pager: [phone] 10/18/2015 5:36 PM  If 7PM-7AM, please contact night-coverage at [email or web address], password [facility]

## 2015-10-18 NOTE — Discharge Instructions (Signed)
Ureteral Stent Implantation, Care After Refer to this sheet in the next few weeks. These instructions provide you with information on caring for yourself after your procedure. Your health care provider may also give you more specific instructions. Your treatment has been planned according to current medical practices, but problems sometimes occur. Call your health care provider if you have any problems or questions after your procedure. WHAT TO EXPECT AFTER THE PROCEDURE You should be back to normal activity within 48 hours after the procedure. Nausea and vomiting may occur and are commonly the result of anesthesia. It is common to experience sharp pain in the back or lower abdomen and penis with voiding. This is caused by movement of the ends of the stent with the act of urinating.It usually goes away within minutes after you have stopped urinating. HOME CARE INSTRUCTIONS Make sure to drink plenty of fluids. You may have small amounts of bleeding, causing your urine to be red. This is normal. Certain movements may trigger pain or a feeling that you need to urinate. You may be given medicines to prevent infection or bladder spasms. Be sure to take all medicines as directed. Only take over-the-counter or prescription medicines for pain, discomfort, or fever as directed by your health care provider. Do not take aspirin, as this can make bleeding worse.  Your stent will be left in until we remove the kidney and ureter stone(s). Be sure to keep all follow-up appointments so your health care provider can check that you are healing properly.  SEEK MEDICAL CARE IF:  You experience increasing pain.  Your pain medicine is not working. SEEK IMMEDIATE MEDICAL CARE IF:  Your urine is dark red or has blood clots.  You are leaking urine (incontinent).  You have a fever, chills, feeling sick to your stomach (nausea), or vomiting.  Your pain is not relieved by pain medicine.  The end of the stent comes out  of the urethra.  You are unable to urinate.   This information is not intended to replace advice given to you by your health care provider. Make sure you discuss any questions you have with your health care provider.   Document Released: 07/22/2013 Document Revised: 11/24/2013 Document Reviewed: 06/03/2015 Elsevier Interactive Patient Education [employer].

## 2015-10-18 NOTE — Anesthesia Postprocedure Evaluation (Signed)
  Anesthesia Post-op Note  Patient: Erica [name]  Procedure(s) Performed: Procedure(s) (LRB): CYSTOSCOPY WITH RETROGRADE PYELOGRAM,  AND STENT PLACEMENT (Right)  Patient Location: PACU  Anesthesia Type: General  Level of Consciousness: awake and alert   Airway and Oxygen Therapy: Patient Spontanous Breathing  Post-op Pain: mild  Post-op Assessment: Post-op Vital signs reviewed, Patient's Cardiovascular Status Stable, Respiratory Function Stable, Patent Airway and No signs of Nausea or vomiting  Last Vitals:  Filed Vitals:   10/18/15 1219  BP:   Pulse:   Temp: 36.8 C  Resp:     Post-op Vital Signs: stable   Complications: No apparent anesthesia complications

## 2015-10-18 NOTE — Op Note (Signed)
Operative Note:  Preoperative Diagnosis: Right ureteral stone and sepsis of urinary origin  Postoperative Diagnosis:  Same  Procedure(s) Performed:  Cystourethroscopy, Right ureteral stent placement, Right retrograde pyelogram and Intraoperative fluoroscopy with interpretation <1 hr  Teaching Surgeon:  Festus Aloe, MD  Resident Surgeon:  Johnette Abraham. Harvel Ricks, MD  Assistant(s):  none  Anesthesia:  General via endotracheal tube  Fluids:  See anesthesia record  Estimated blood loss:  5 mL  Specimens:  None  Cultures:  None  Drains:   1. Right 4F x 24 cm JJ ureteral stent without string 2. 16 F foley catheter with 10 mL sterile water in balloon  Complications:  None  Indications: See H&P  Findings:   1. Entry into bladder demonstrated significant debris 2. Right retrograde pyelogram demonstrated evidence of filling defect in distal right ureter with no significant hydroureteronephrosis and no extravasation of contrast 3. Successful placement of right 4F x 24 cm JJ ureteral stent without string 4. Foley catheter placed at the conclusion of the case  Description:  The patient was correctly identified in the preop holding area where written informed consent as well potential risk and complication reviewed. She agreed. They were brought back to the operative suite where a preinduction timeout was performed. Once correct information was verified, general anesthesia was induced via endotracheal tube. They were then gently placed into dorsal lithotomy position with SCDs in place for VTE prophylaxis. They were prepped and draped in the usual sterile fashion and given appropriate preoperative antibiotics. A second timeout was then performed.   We inserted a 35F rigid cystoscope per urethra with copious lubrication and normal saline irrigation running. This demonstrated findings as described above.    We cannulated the right ureteral orifice with a open ended catheter and performed a  right retrograde pyelogram. This demonstrated a filling defect in the right distal ureter consistent with known right ureteral stone. No significant hydroureteronephrosis and no extravasation of contrast.   We then replaced the wire into the collecting system with mild resistance as we passed the wire beyond the stone. We then removed the open ended stent. We then obtained a 4F x 24 cm JJ ureteral stent which we advanced over the wire into the renal pelvis under fluoroscopic and direct visual guidance. There was a well positioned proximal stent curl in the right renal pelvis and stent curl was directly visualized in the bladder.  There was no significant efflux of purulent material from the right stent. All instrumentation was removed and the bladder was left full. We elected to place a 80 F foley catheter for complete decompression of the urinary system. The patient was taken to the PACU in stable condition.  Post Op Plan:   1. Transfer patient back to floor when meets PACU criteria. 2. Keep patient on antibiotics for 2-3 weeks prior to right ureteroscopic stone extraction  Attestation:  Dr. Junious Silk was present and scrubbed for the entirety of the procedure.

## 2015-10-18 NOTE — Progress Notes (Signed)
Pt transferred to 1401 via wheelchair. Report given and all questions answered. Belongings sent with patient. Family at bedside and updated.

## 2015-10-18 NOTE — Progress Notes (Signed)
*  PRELIMINARY RESULTS* Echocardiogram 2D Echocardiogram has been performed.  Erica [name] 10/18/2015, 2:22 PM

## 2015-10-18 NOTE — Progress Notes (Signed)
ANTIBIOTIC CONSULT NOTE - follow-up  Pharmacy Consult for Vancomycin Indication: sepsis, ? Endocarditis, ? UTI  No Known Allergies  Patient Measurements: Height: 5\' 2"  (157.5 cm) Weight: 136 lb 14.5 oz (62.1 kg) IBW/kg (Calculated) : 50.1 Height: 5\' 2"   Vital Signs: Temp: 98.3 F (36.8 C) (11/15 0800) Temp Source: Oral (11/15 0800) BP: 132/94 mmHg (11/15 0800) Pulse Rate: 62 (11/15 0800) Intake/Output from previous day: 11/14 0701 - 11/15 0700 In: 1200 [I.V.:1100; IV Piggyback:100] Out: 950 [Urine:950] Intake/Output from this shift: Total I/O In: 1000 [I.V.:1000] Out: 30 [Urine:30]  Labs:  Recent Labs  10/17/15 1503 10/18/15 0410  WBC 17.5* 14.7*  HGB 10.1* 9.4*  PLT 303 298  CREATININE 1.46* 1.07*   Estimated Creatinine Clearance: 40 mL/min (by C-G formula based on Cr of 1.07). No results for input(s): VANCOTROUGH, VANCOPEAK, VANCORANDOM, GENTTROUGH, GENTPEAK, GENTRANDOM, TOBRATROUGH, TOBRAPEAK, TOBRARND, AMIKACINPEAK, AMIKACINTROU, AMIKACIN in the last 72 hours.   Microbiology: Recent Results (from the past 720 hour(s))  MRSA PCR Screening     Status: None   Collection Time: 10/17/15  7:06 PM  Result Value Ref Range Status   MRSA by PCR NEGATIVE NEGATIVE Final    Comment:        The GeneXpert MRSA Assay (FDA approved for NASAL specimens only), is one component of a comprehensive MRSA colonization surveillance program. It is not intended to diagnose MRSA infection nor to guide or monitor treatment for MRSA infections.     Medical History: Past Medical History  Diagnosis Date  . Coronary artery disease     status post multiple percutaneous interventions  . Hypertension   . Hyperlipidemia   . Hypothyroidism   . Osteoarthritis     Assessment: 49 y/oF with PMH of CAD, HTN, HLD, hypothyroidism, OA who presents with fever x 9 days, body aches, n/v, diarrhea, and R flank pain. Per EDP, heart murmur heard on physical exam. UA turbid with many  bacteria, nitrite negative, moderate leukocytes, TNTC WBC. Code sepsis initiated in ED. Ceftriaxone 2g IV x 1 given in ED, and pharmacy consulted to assist with dosing of Vancomycin for sepsis, ? endocarditis, ? UTI.   11/14 >> Ceftriaxone x 1 11/14 >> Vancomycin >>    11/14 blood: sent 11/14 urine: sent   Tmax: fever curve improving WBC elevated but improving SCr 1.07 (improved) with CrCl ~ 29ml/min CG Lactic Acid: 1.49  Goal of Therapy:  Vancomycin trough level 15-20 mcg/ml  Appropriate antibiotic dosing for renal function Eradication of infection  Plan:   For improved renal function, change vancomycin 500mg  IV q12h  Plan for Vancomycin trough level at steady state.  Monitor renal function, cultures, clinical course.   Doreene Eland, PharmD, BCPS.   Pager: [phone] 10/18/2015 11:05 AM

## 2015-10-18 NOTE — Consult Note (Signed)
Urology Consult  Referring physician: Berle Mull, MD Reason for referral: Obstructing right ureteral stone  Chief Complaint: Right ureteral stone and sepsis of urinary origin  History of Present Illness:  Erica [name] is a 74 y.o. female with a history of coronary artery disease, hypertension, hypothyroidism. She presented to the ER yesterday afternoon with with complaints of 9 days off fever with chills and generalized malaise. Patient called her PCP and she was prescribed Tamiflu. Despite taking that she did not improve and therefore decided to come to the hospital.  Patient also denies any urinary symptoms. Patient complains of right-sided flank pain whenever her fever occurs. On arrival to the ER she was Patient was found to be hypotensive to 81/21 and became febrile upto 103 without associated tachycardia. Her microscopic UA returned grossly positive with TNTC WBCs, many bacteria, moderate LE, negative nitrites and moderate blood. She was admitted to the hospitalist service, blood cultures and urine cultures were sent, she was started on broad spectrum antibiotics and a CT was obtained yesterday evening which demonstrated a 5 mm right UVJ stone with mild hydronephrosis and no significant stranding. Following her initial fever she remained afebrile with stable vital signs following administration of antibiotics.  Urology is consulted to evaluation of right ureteral stone in the setting of sepsis of urinary origin.  Past Medical History  Diagnosis Date  . Coronary artery disease     status post multiple percutaneous interventions  . Hypertension   . Hyperlipidemia   . Hypothyroidism   . Osteoarthritis    Past Surgical History  Procedure Laterality Date  . Ankle surgery    . 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 Cardiac catheterization  12-2008  . Coronary stent placement  2006     right coronary artery with drug-eluting stent  . Coronary stent placement  1998   bare-metal stent to the right coronary artery  . Coronary stent placement  2008    drug-eluting stent placed in the left circumflex    Medications: I have reviewed the patient's current medications. Allergies: No Known Allergies  Family History  Problem Relation Age of Onset  . Other Mother 90    accident  . Other      Patient was adopted and has no information about her siblings   Social History:  reports that she has quit smoking. She does not have any smokeless tobacco history on file. She reports that she does not drink alcohol or use illicit drugs.  ROS 12 system review was performed and was negative except for the pertinent positives listed in the HPI  Physical Exam:  Vital signs in last 24 hours: Temp:  [98 F (36.7 C)-103 F (39.4 C)] 100.7 F (38.2 C) (11/15 0047) Pulse Rate:  [59-76] 66 (11/15 0400) Resp:  [15-23] 22 (11/15 0400) BP: (81-117)/(21-62) 111/37 mmHg (11/15 0400) SpO2:  [92 %-98 %] 93 % (11/15 0400) Weight:  [55.792 kg (123 lb)-62.1 kg (136 lb 14.5 oz)] 62.1 kg (136 lb 14.5 oz) (11/15 0500) Physical Exam General: NAD, on [place] Abd: Soft, right upper and lower quadrant mildly tender to palpation, no CVA tenderness bilaterally Ext: No LE edema  Laboratory Data:  Results for orders placed or performed during the hospital encounter of 10/17/15 (from the past 72 hour(s))  Comprehensive metabolic panel     Status: Abnormal   Collection Time: 10/17/15  3:03 PM  Result Value Ref Range   Sodium 135 135 - 145 mmol/L  Potassium 3.8 3.5 - 5.1 mmol/L   Chloride 102 101 - 111 mmol/L   CO2 21 (L) 22 - 32 mmol/L   Glucose, Bld 147 (H) 65 - 99 mg/dL   BUN 27 (H) 6 - 20 mg/dL   Creatinine, Ser 1.46 (H) 0.44 - 1.00 mg/dL   Calcium 8.2 (L) 8.9 - 10.3 mg/dL   Total Protein 6.1 (L) 6.5 - 8.1 g/dL   Albumin 2.6 (L) 3.5 - 5.0 g/dL   AST 19 15 - 41 U/L   ALT 23 14 - 54 U/L   Alkaline Phosphatase 72 38 - 126 U/L   Total Bilirubin 0.7 0.3 - 1.2 mg/dL   GFR calc non Af  Amer 34 (L) >60 mL/min   GFR calc Af Amer 40 (L) >60 mL/min    Comment: (NOTE) The eGFR has been calculated using the CKD EPI equation. This calculation has not been validated in all clinical situations. eGFR's persistently <60 mL/min signify possible Chronic Kidney Disease.    Anion gap 12 5 - 15  CBC with Differential     Status: Abnormal   Collection Time: 10/17/15  3:03 PM  Result Value Ref Range   WBC 17.5 (H) 4.0 - 10.5 K/uL   RBC 3.47 (L) 3.87 - 5.11 MIL/uL   Hemoglobin 10.1 (L) 12.0 - 15.0 g/dL   HCT 29.7 (L) 36.0 - 46.0 %   MCV 85.6 78.0 - 100.0 fL   MCH 29.1 26.0 - 34.0 pg   MCHC 34.0 30.0 - 36.0 g/dL   RDW 13.8 11.5 - 15.5 %   Platelets 303 150 - 400 K/uL   Neutrophils Relative % 88 %   Lymphocytes Relative 3 %   Monocytes Relative 9 %   Eosinophils Relative 0 %   Basophils Relative 0 %   Neutro Abs 15.4 (H) 1.7 - 7.7 K/uL   Lymphs Abs 0.5 (L) 0.7 - 4.0 K/uL   Monocytes Absolute 1.6 (H) 0.1 - 1.0 K/uL   Eosinophils Absolute 0.0 0.0 - 0.7 K/uL   Basophils Absolute 0.0 0.0 - 0.1 K/uL   RBC Morphology POLYCHROMASIA PRESENT    WBC Morphology MILD LEFT SHIFT (1-5% METAS, OCC MYELO, OCC BANDS)   I-Stat CG4 Lactic Acid, ED     Status: None   Collection Time: 10/17/15  3:16 PM  Result Value Ref Range   Lactic Acid, Venous 1.49 0.5 - 2.0 mmol/L  Urinalysis, Routine w reflex microscopic (not at [place])     Status: Abnormal   Collection Time: 10/17/15  3:47 PM  Result Value Ref Range   Color, Urine YELLOW YELLOW   APPearance TURBID (A) CLEAR   Specific Gravity, Urine 1.014 1.005 - 1.030   pH 5.5 5.0 - 8.0   Glucose, UA NEGATIVE NEGATIVE mg/dL   Hgb urine dipstick MODERATE (A) NEGATIVE   Bilirubin Urine NEGATIVE NEGATIVE   Ketones, ur NEGATIVE NEGATIVE mg/dL   Protein, ur 100 (A) NEGATIVE mg/dL   Urobilinogen, UA 0.2 0.0 - 1.0 mg/dL   Nitrite NEGATIVE NEGATIVE   Leukocytes, UA MODERATE (A) NEGATIVE  Urine microscopic-add on     Status: Abnormal   Collection Time:  10/17/15  3:47 PM  Result Value Ref Range   Squamous Epithelial / LPF FEW (A) RARE   WBC, UA TOO NUMEROUS TO COUNT <3 WBC/hpf   RBC / HPF 11-20 <3 RBC/hpf   Bacteria, UA MANY (A) RARE  MRSA PCR Screening     Status: None   Collection Time: 10/17/15  7:06 PM  Result Value Ref Range   MRSA by PCR NEGATIVE NEGATIVE    Comment:        The GeneXpert MRSA Assay (FDA approved for NASAL specimens only), is one component of a comprehensive MRSA colonization surveillance program. It is not intended to diagnose MRSA infection nor to guide or monitor treatment for MRSA infections.   Comprehensive metabolic panel     Status: Abnormal   Collection Time: 10/18/15  4:10 AM  Result Value Ref Range   Sodium 135 135 - 145 mmol/L   Potassium 3.7 3.5 - 5.1 mmol/L   Chloride 108 101 - 111 mmol/L   CO2 20 (L) 22 - 32 mmol/L   Glucose, Bld 158 (H) 65 - 99 mg/dL   BUN 21 (H) 6 - 20 mg/dL   Creatinine, Ser 1.07 (H) 0.44 - 1.00 mg/dL   Calcium 7.8 (L) 8.9 - 10.3 mg/dL   Total Protein 5.3 (L) 6.5 - 8.1 g/dL   Albumin 2.3 (L) 3.5 - 5.0 g/dL   AST 20 15 - 41 U/L   ALT 22 14 - 54 U/L   Alkaline Phosphatase 65 38 - 126 U/L   Total Bilirubin 0.7 0.3 - 1.2 mg/dL   GFR calc non Af Amer 50 (L) >60 mL/min   GFR calc Af Amer 58 (L) >60 mL/min    Comment: (NOTE) The eGFR has been calculated using the CKD EPI equation. This calculation has not been validated in all clinical situations. eGFR's persistently <60 mL/min signify possible Chronic Kidney Disease.    Anion gap 7 5 - 15  CBC     Status: Abnormal   Collection Time: 10/18/15  4:10 AM  Result Value Ref Range   WBC 14.7 (H) 4.0 - 10.5 K/uL   RBC 3.21 (L) 3.87 - 5.11 MIL/uL   Hemoglobin 9.4 (L) 12.0 - 15.0 g/dL   HCT 28.4 (L) 36.0 - 46.0 %   MCV 88.5 78.0 - 100.0 fL   MCH 29.3 26.0 - 34.0 pg   MCHC 33.1 30.0 - 36.0 g/dL   RDW 14.0 11.5 - 15.5 %   Platelets 298 150 - 400 K/uL  Protime-INR     Status: Abnormal   Collection Time: 10/18/15  4:10  AM  Result Value Ref Range   Prothrombin Time 16.6 (H) 11.6 - 15.2 seconds   INR 1.33 0.00 - 1.49   Recent Results (from the past 240 hour(s))  MRSA PCR Screening     Status: None   Collection Time: 10/17/15  7:06 PM  Result Value Ref Range Status   MRSA by PCR NEGATIVE NEGATIVE Final    Comment:        The GeneXpert MRSA Assay (FDA approved for NASAL specimens only), is one component of a comprehensive MRSA colonization surveillance program. It is not intended to diagnose MRSA infection nor to guide or monitor treatment for MRSA infections.     Recent Labs  10/17/15 1503 10/18/15 0410  CREATININE 1.46* 1.07*   Impression/Assessment:  74 yo F with h/o CAD presenting with right 5 mm UVJ stone and sepsis of urinary origin.  Plan:  1. Post patient for emergent cystoscopy with right ureteral stent placement and right retrograde pyelogram 2. NPO now 3. Continue broad spectrum antibiotics and supportive care post-op per primary team  4. Follow up blood cultures and urine culture 5. She will require right ureteroscopic stone extraction in ~2 weeks following full treatment of her sepsis  Acie Fredrickson 10/18/2015,  5:27 AM

## 2015-10-18 NOTE — Anesthesia Preprocedure Evaluation (Addendum)
Anesthesia Evaluation  Patient identified by MRN, date of birth, ID band Patient awake    Reviewed: Allergy & Precautions, NPO status , Patient's Chart, lab work & pertinent test results  Airway Mallampati: II  TM Distance: >3 FB Neck ROM: Full    Dental no notable dental hx. (+) Upper Dentures   Pulmonary former smoker,    Pulmonary exam normal breath sounds clear to auscultation       Cardiovascular hypertension, Pt. on medications + CAD, + Cardiac Stents and + CABG (2011)  Normal cardiovascular exam Rhythm:Regular Rate:Normal     Neuro/Psych negative neurological ROS  negative psych ROS   GI/Hepatic negative GI ROS, Neg liver ROS,   Endo/Other  negative endocrine ROS  Renal/GU negative Renal ROS  negative genitourinary   Musculoskeletal negative musculoskeletal ROS (+)   Abdominal   Peds negative pediatric ROS (+)  Hematology negative hematology ROS (+)   Anesthesia Other Findings   Reproductive/Obstetrics negative OB ROS                            Anesthesia Physical Anesthesia Plan  ASA: III  Anesthesia Plan: General   Post-op Pain Management:    Induction: Intravenous  Airway Management Planned: LMA  Additional Equipment:   Intra-op Plan:   Post-operative Plan: Extubation in OR  Informed Consent: I have reviewed the patients History and Physical, chart, labs and discussed the procedure including the risks, benefits and alternatives for the proposed anesthesia with the patient or authorized representative who has indicated his/her understanding and acceptance.   Dental advisory given  Plan Discussed with: CRNA  Anesthesia Plan Comments:         Anesthesia Quick Evaluation

## 2015-10-18 NOTE — Progress Notes (Signed)
Follow-up:  Dr Frederico Hamman w/ urology service was consulted regarding results of pt's Ct "Renal Stone Study" which revealed right-sided hydronephrosis and hydroureter secondary to 5 mm distal ureteral calculus. She has agreed to review ct results and evaluate pt for same.   Jeryl [place], NP-C [facility] Pager [phone]

## 2015-10-18 NOTE — Anesthesia Procedure Notes (Signed)
Procedure Name: LMA Insertion Date/Time: 10/18/2015 6:38 AM Performed by: Carleene Cooper A Pre-anesthesia Checklist: Patient identified, Emergency Drugs available, Suction available, Patient being monitored and Timeout performed Patient Re-evaluated:Patient Re-evaluated prior to inductionOxygen Delivery Method: Circle system utilized Preoxygenation: Pre-oxygenation with 100% oxygen Intubation Type: IV induction LMA: LMA with gastric port inserted Number of attempts: 1 Placement Confirmation: positive ETCO2 and breath sounds checked- equal and bilateral Tube secured with: Tape Dental Injury: Teeth and Oropharynx as per pre-operative assessment

## 2015-10-18 NOTE — Clinical Documentation Improvement (Signed)
Internal Medicine  Abnormal Lab/Test Results:     Component     Latest Ref Rng 10/17/2015 10/18/2015  BUN     6 - 20 mg/dL 27 (H) 21 (H)  Creatinine     0.44 - 1.00 mg/dL 1.46 (H) 1.07 (H)                              EGFR (Non-African Amer.)     >60 mL/min 34 (L) 50 (L)   Possible Clinical Conditions associated with below indicators   Acute renal failure  Acute kidney injury  Other Condition  Cannot Clinically Determine   Supporting Information:  Sepsis Temp max 103 Right ureteral stone requiring emergent surgery Per Op note: Right retrograde pyelogram demonstrated evidence of filling defect in distal right ureter with no significant hydroureteronephrosis and no extravasation of contrast  Treatment Provided: IV NS'@100ml' /h Successful placement of right 42F x 24 cm JJ ureteral stent without string  Foley catheter placed at the conclusion of the case Daily weight Monitoring CMP Monitoring I&)  Please exercise your independent, professional judgment when responding. A specific answer is not anticipated or expected.   Thank You,  [place] [phone]

## 2015-10-19 DIAGNOSIS — I10 Essential (primary) hypertension: Secondary | ICD-10-CM

## 2015-10-19 DIAGNOSIS — A419 Sepsis, unspecified organism: Secondary | ICD-10-CM

## 2015-10-19 DIAGNOSIS — N139 Obstructive and reflux uropathy, unspecified: Secondary | ICD-10-CM

## 2015-10-19 DIAGNOSIS — I2510 Atherosclerotic heart disease of native coronary artery without angina pectoris: Secondary | ICD-10-CM

## 2015-10-19 DIAGNOSIS — N179 Acute kidney failure, unspecified: Secondary | ICD-10-CM

## 2015-10-19 DIAGNOSIS — N390 Urinary tract infection, site not specified: Secondary | ICD-10-CM

## 2015-10-19 DIAGNOSIS — A4150 Gram-negative sepsis, unspecified: Principal | ICD-10-CM

## 2015-10-19 DIAGNOSIS — I519 Heart disease, unspecified: Secondary | ICD-10-CM

## 2015-10-19 DIAGNOSIS — R0989 Other specified symptoms and signs involving the circulatory and respiratory systems: Secondary | ICD-10-CM

## 2015-10-19 LAB — CBC WITH DIFFERENTIAL/PLATELET
BASOS ABS: 0 10*3/uL (ref 0.0–0.1)
Basophils Relative: 0 %
Eosinophils Absolute: 0.1 10*3/uL (ref 0.0–0.7)
Eosinophils Relative: 1 %
HEMATOCRIT: 30.1 % — AB (ref 36.0–46.0)
HEMOGLOBIN: 9.8 g/dL — AB (ref 12.0–15.0)
LYMPHS PCT: 7 %
Lymphs Abs: 0.9 10*3/uL (ref 0.7–4.0)
MCH: 28.5 pg (ref 26.0–34.0)
MCHC: 32.6 g/dL (ref 30.0–36.0)
MCV: 87.5 fL (ref 78.0–100.0)
Monocytes Absolute: 0.8 10*3/uL (ref 0.1–1.0)
Monocytes Relative: 6 %
NEUTROS ABS: 10.3 10*3/uL — AB (ref 1.7–7.7)
Neutrophils Relative %: 86 %
Platelets: 317 10*3/uL (ref 150–400)
RBC: 3.44 MIL/uL — AB (ref 3.87–5.11)
RDW: 14.1 % (ref 11.5–15.5)
WBC: 12.1 10*3/uL — AB (ref 4.0–10.5)

## 2015-10-19 LAB — URINE CULTURE: Culture: >=100000

## 2015-10-19 LAB — COMPREHENSIVE METABOLIC PANEL
ALT: 33 U/L (ref 14–54)
AST: 33 U/L (ref 15–41)
Albumin: 2.2 g/dL — ABNORMAL LOW (ref 3.5–5.0)
Alkaline Phosphatase: 72 U/L (ref 38–126)
Anion gap: 7 (ref 5–15)
BILIRUBIN TOTAL: 0.2 mg/dL — AB (ref 0.3–1.2)
BUN: 13 mg/dL (ref 6–20)
CO2: 22 mmol/L (ref 22–32)
Calcium: 7.9 mg/dL — ABNORMAL LOW (ref 8.9–10.3)
Chloride: 111 mmol/L (ref 101–111)
Creatinine, Ser: 0.97 mg/dL (ref 0.44–1.00)
GFR calc Af Amer: >60 mL/min (ref >60)
GFR, EST NON AFRICAN AMERICAN: 56 mL/min — AB (ref >60)
GLUCOSE: 121 mg/dL — AB (ref 65–99)
Potassium: 3.7 mmol/L (ref 3.5–5.1)
Sodium: 140 mmol/L (ref 135–145)
TOTAL PROTEIN: 5.3 g/dL — AB (ref 6.5–8.1)

## 2015-10-19 NOTE — Progress Notes (Signed)
[facility]                                                                              Patient Demographics  Erica [name], is a 74 y.o. female, DOB - [birth year], [id number]  Admit date - 10/17/2015   Admitting Physician Lavina Hamman, MD  Outpatient Primary MD for the patient is Mayra Neer, MD  LOS - 2   Chief Complaint  Patient presents with  . Fever      HPI on 10/17/2015 by Dr. Berle Mull Erica [name] is a 74 y.o. female with Past medical history of coronary artery disease, hypertension, hypothyroidism. The patient presents with complaints of 9 days off fever with chills and generalized malaise. Patient was significantly tired and fatigued on initial 2 day and has been sleeping for the whole day. Patient called her PCP and she was prescribed Tamiflu. Despite taking that she did not improve and therefore decided to come to the hospital. Patient was found to be hypertensive. In the admission and was recommended for admission. Patient denies any recent change in her medication no sick contact of nausea and vomiting no diarrhea. Patient also denies any urinary symptoms. Patient complains of right-sided flank pain whenever her fever occurs. The patient is coming from home. At her baseline ambulates without support And is independent for most of her ADL; manages her medication on her own.  Assessment & Plan   Sepsis secondary to UTI/obstructive uropathy -Upon admission, patient was febrile with tachypnea and leukocytosis -WBC count improving to 12.1 from 17 -CT showed right-sided hydronephrosis and hydroureter secondary 5 mm distal ureteral calculus -UA showed TNTC WBC, moderate leukocytes, many bacteria -Urine culture >100K Klebsiella pneumonia mainly pan sensitive -Blood cultures negative to date -Continue ceftriaxone -Will likely transition to oral antibiotics 10/20/2015 -Spoke with Dr. Junious Silk, urology, who recommended 10 days of oral antibiotics, with  outpatient follow-up in 2-3 weeks  Essential Hypertension -Atenolol held, blood pressures have been soft  Coronary artery disease -Currently chest pain-free, continue aspirin  Acute kidney injury -Likely secondary to obstructive uropathy -Creatinine improved, currently 0.97  Abdominal bruit -Patient has calcified abdominal aorta with no aneurysm -Echocardiogram does not show any acute valvular disease  Chronic Diastolic dysfunction/aortic murmur -Echocardiogram negative for any vegetation, EF [id number], grade 2 diastolic dysfunction -Patient appears to be euvolemic -Will discontinue IV fluids  Code Status: full  Family Communication: Daughter at bedside  Disposition Plan: Admitted.  Likely discharge in the next 24 hours   Time Spent in minutes   30 minutes  Procedures  Cystourethroscopy, right ureteral stent placement  Consults   Urology   DVT Prophylaxis  heparin   Lab Results  Component Value Date   PLT 317 10/19/2015    Medications  Scheduled Meds: . aspirin EC  81 mg Oral Daily  . atorvastatin  40 mg Oral q1800  . cefTRIAXone (ROCEPHIN)  IV  1 g Intravenous Q24H  . heparin  5,000 Units Subcutaneous 3 times per day  . levothyroxine  100 mcg Oral QAC breakfast  . sodium chloride  3 mL Intravenous Q12H   Continuous Infusions: . sodium chloride 100 mL/hr at 10/19/15 0540   PRN Meds:.acetaminophen **OR**  acetaminophen, fentaNYL (SUBLIMAZE) injection, meperidine (DEMEROL) injection, ondansetron **OR** ondansetron (ZOFRAN) IV, promethazine  Antibiotics    Anti-infectives    Start     Dose/Rate Route Frequency Ordered Stop   10/19/15 1600  cefTRIAXone (ROCEPHIN) 1 g in dextrose 5 % 50 mL IVPB     1 g 100 mL/hr over 30 Minutes Intravenous Every 24 hours 10/18/15 1743     10/18/15 1600  cefTRIAXone (ROCEPHIN) 2 g in dextrose 5 % 50 mL IVPB  Status:  Discontinued     2 g 100 mL/hr over 30 Minutes Intravenous Every 24 hours 10/17/15 1943 10/17/15 2013   10/18/15  1400  vancomycin (VANCOCIN) 500 mg in sodium chloride 0.9 % 100 mL IVPB  Status:  Discontinued     500 mg 100 mL/hr over 60 Minutes Intravenous Every 24 hours 10/17/15 1702 10/18/15 1107   10/18/15 1200  vancomycin (VANCOCIN) 500 mg in sodium chloride 0.9 % 100 mL IVPB  Status:  Discontinued     500 mg 100 mL/hr over 60 Minutes Intravenous 2 times daily 10/18/15 1107 10/18/15 1738   10/18/15 0400  cefTRIAXone (ROCEPHIN) 2 g in dextrose 5 % 50 mL IVPB  Status:  Discontinued     2 g 100 mL/hr over 30 Minutes Intravenous Every 12 hours 10/17/15 2013 10/18/15 1743   10/17/15 1615  vancomycin (VANCOCIN) IVPB 1000 mg/200 mL premix     1,000 mg 200 mL/hr over 60 Minutes Intravenous STAT 10/17/15 1602 10/17/15 1717   10/17/15 1600  cefTRIAXone (ROCEPHIN) 2 g in dextrose 5 % 50 mL IVPB     2 g 100 mL/hr over 30 Minutes Intravenous  Once 10/17/15 1549 10/17/15 1626   10/17/15 1550  vancomycin (VANCOCIN) 1 GM/200ML IVPB    Comments:  Beckey Rutter   : cabinet override      10/17/15 1550 10/18/15 0359      Subjective:   Erica [name] seen and examined today.  Patient states she is feeling better today. Denies any chest pain or shortness of breath, abdominal pain. Would like to have the Foley taken out. Patient did complain of feeling warm yesterday.   Objective:   Filed Vitals:   10/19/15 0010 10/19/15 0147 10/19/15 0548 10/19/15 0738  BP:  113/42 119/55   Pulse:  66 71   Temp: 98 F (36.7 C) 98.5 F (36.9 C) 99.6 F (37.6 C)   TempSrc: Oral Oral Oral   Resp:  18 18   Height:      Weight:      SpO2:  92% 83% 93%    Wt Readings from Last 3 Encounters:  10/18/15 62.1 kg (136 lb 14.5 oz)  08/06/15 57.153 kg (126 lb)  12/09/14 62.596 kg (138 lb)     Intake/Output Summary (Last 24 hours) at 10/19/15 1128 Last data filed at 10/19/15 0900  Gross per 24 hour  Intake   2740 ml  Output   1100 ml  Net   1640 ml    Exam  General: Well developed, well nourished, NAD, appears stated  age  37: NCAT,mucous membranes moist.   Cardiovascular: S1 S2 auscultated, 2/6 SEM, Regular rate and rhythm.  Respiratory: Clear to auscultation bilaterally with equal chest rise  Abdomen: Soft, nontender, nondistended, + bowel sounds  Extremities: warm dry without cyanosis clubbing or edema  Neuro: AAOx3, nonfocal  Psych: Normal affect and demeanor with intact judgement and insight  Data Review   Micro Results Recent Results (from the past 240 hour(s))  Culture, blood (routine x 2)     Status: None (Preliminary result)   Collection Time: 10/17/15  3:05 PM  Result Value Ref Range Status   Specimen Description BLOOD RIGHT ARM  Final   Special Requests BOTTLES DRAWN AEROBIC AND ANAEROBIC 5CC  Final   Culture   Final    NO GROWTH < 24 HOURS Performed at [facility]    Report Status PENDING  Incomplete  Culture, blood (routine x 2)     Status: None (Preliminary result)   Collection Time: 10/17/15  3:20 PM  Result Value Ref Range Status   Specimen Description BLOOD LEFT WRIST  Final   Special Requests BOTTLES DRAWN AEROBIC AND ANAEROBIC 4CC  Final   Culture   Final    NO GROWTH < 24 HOURS Performed at [facility]    Report Status PENDING  Incomplete  Urine culture     Status: None   Collection Time: 10/17/15  3:47 PM  Result Value Ref Range Status   Specimen Description URINE, CLEAN CATCH  Final   Special Requests NONE  Final   Culture   Final    >=100,000 COLONIES/mL KLEBSIELLA PNEUMONIAE Performed at [facility]    Report Status 10/19/2015 FINAL  Final   Organism ID, Bacteria KLEBSIELLA PNEUMONIAE  Final      Susceptibility   Klebsiella pneumoniae - MIC*    AMPICILLIN 16 RESISTANT Resistant     CEFAZOLIN <=4 SENSITIVE Sensitive     CEFTRIAXONE <=1 SENSITIVE Sensitive     CIPROFLOXACIN <=0.25 SENSITIVE Sensitive     GENTAMICIN <=1 SENSITIVE Sensitive     IMIPENEM <=0.25 SENSITIVE Sensitive     NITROFURANTOIN 32 SENSITIVE Sensitive      TRIMETH/SULFA <=20 SENSITIVE Sensitive     AMPICILLIN/SULBACTAM 4 SENSITIVE Sensitive     PIP/TAZO <=4 SENSITIVE Sensitive     * >=100,000 COLONIES/mL KLEBSIELLA PNEUMONIAE  MRSA PCR Screening     Status: None   Collection Time: 10/17/15  7:06 PM  Result Value Ref Range Status   MRSA by PCR NEGATIVE NEGATIVE Final    Comment:        The GeneXpert MRSA Assay (FDA approved for NASAL specimens only), is one component of a comprehensive MRSA colonization surveillance program. It is not intended to diagnose MRSA infection nor to guide or monitor treatment for MRSA infections.     Radiology Reports Dg Chest 2 View  10/17/2015  CLINICAL DATA:  Fever.  Posterior right chest wall pain. EXAM: CHEST  2 VIEW COMPARISON:  03/16/2015 FINDINGS: Heart size and pulmonary vascularity are normal. Prior CABG. Slight atelectasis at the right lung base laterally. No infiltrates or effusions. No acute osseous abnormality. Chronic thoracic scoliosis. IMPRESSION: Minimal atelectasis at the right lung base. Electronically Signed   By: Lorriane Shire M.D.   On: 10/17/2015 14:06   Ct Renal Stone Study  10/17/2015  CLINICAL DATA:  Right flank pain.  Nausea and vomiting. EXAM: CT ABDOMEN AND PELVIS WITHOUT CONTRAST TECHNIQUE: Multidetector CT imaging of the abdomen and pelvis was performed following the standard protocol without IV contrast. COMPARISON:  None FINDINGS: Lower chest: Small bilateral pleural effusions and mild interstitial prominence is noted. RCA coronary artery calcification and thoracic aortic atherosclerotic calcifications noted. Hepatobiliary: No suspicious liver abnormality identified. The gallbladder appears normal. No biliary dilatation. Pancreas: Normal appearance of the pancreas. Spleen: The spleen is negative. Adrenals/Urinary Tract: The adrenal glands are negative. There is right-sided nephro megaly, perinephric fat stranding and hydronephrosis.  Stone within the inferior pole of right kidney  measures 4 mm. Right-sided hydroureter and periureteral fat stranding noted. Distal right ureteral calculus measures 5 mm. No stone identified within the urinary bladder. Left kidney is normal. Stomach/Bowel: The stomach is within normal limits. The small bowel loops have a normal course and caliber. No obstruction. Normal appearance of the colon. Vascular/Lymphatic: Calcified atherosclerotic disease involves the abdominal aorta. No aneurysm. No enlarged retroperitoneal or mesenteric adenopathy. No enlarged pelvic or inguinal lymph nodes. Reproductive: Uterus in the adnexal structures have a normal appearance for patient's age. Other: Small amount of free fluid identified within the dependent portion of the pelvis. Musculoskeletal: Degenerative disc disease noted within the lumbar spine. This is most advanced at T12-L1. There is an anterolisthesis of L4 on L5 noted. IMPRESSION: 1. Right-sided hydronephrosis and hydroureter secondary to 5 mm distal ureteral calculus. 2. Aortic atherosclerosis as well as coronary artery calcification. 3. Small amount of pleural fluid and interstitial edema noted within the lung bases. Correlate for any clinical signs or symptoms of CHF. Electronically Signed   By: Kerby Moors M.D.   On: 10/17/2015 19:22    CBC  Recent Labs Lab 10/17/15 1503 10/18/15 0410 10/19/15 0533  WBC 17.5* 14.7* 12.1*  HGB 10.1* 9.4* 9.8*  HCT 29.7* 28.4* 30.1*  PLT 303 298 317  MCV 85.6 88.5 87.5  MCH 29.1 29.3 28.5  MCHC 34.0 33.1 32.6  RDW 13.8 14.0 14.1  LYMPHSABS 0.5*  --  0.9  MONOABS 1.6*  --  0.8  EOSABS 0.0  --  0.1  BASOSABS 0.0  --  0.0    Chemistries   Recent Labs Lab 10/17/15 1503 10/18/15 0410 10/19/15 0533  NA 135 135 140  K 3.8 3.7 3.7  CL 102 108 111  CO2 21* 20* 22  GLUCOSE 147* 158* 121*  BUN 27* 21* 13  CREATININE 1.46* 1.07* 0.97  CALCIUM 8.2* 7.8* 7.9*  AST 19 20 33  ALT 23 22 33  ALKPHOS 72 65 72  BILITOT 0.7 0.7 0.2*    ------------------------------------------------------------------------------------------------------------------ estimated creatinine clearance is 44.1 mL/min (by C-G formula based on Cr of 0.97). ------------------------------------------------------------------------------------------------------------------ No results for input(s): HGBA1C in the last 72 hours. ------------------------------------------------------------------------------------------------------------------ No results for input(s): CHOL, HDL, LDLCALC, TRIG, CHOLHDL, LDLDIRECT in the last 72 hours. ------------------------------------------------------------------------------------------------------------------ No results for input(s): TSH, T4TOTAL, T3FREE, THYROIDAB in the last 72 hours.  Invalid input(s): FREET3 ------------------------------------------------------------------------------------------------------------------ No results for input(s): VITAMINB12, FOLATE, FERRITIN, TIBC, IRON, RETICCTPCT in the last 72 hours.  Coagulation profile  Recent Labs Lab 10/18/15 0410  INR 1.33    No results for input(s): DDIMER in the last 72 hours.  Cardiac Enzymes No results for input(s): CKMB, TROPONINI, MYOGLOBIN in the last 168 hours.  Invalid input(s): CK ------------------------------------------------------------------------------------------------------------------ Invalid input(s): POCBNP    Tiann Saha D.O. on 10/19/2015 at 11:28 AM  Between 7am to 7pm - Pager - [phone]  After 7pm go to [email or web address] - password [email or web address]  And look for the night coverage person covering for me after hours  [facility] Office  [phone]

## 2015-10-19 NOTE — Progress Notes (Signed)
A/P -  Right ureteral stone, sepsis - s/p right ureteral stent. Will d/c foley. Urine Cx klebsiella. On abx. Plan ureteroscopy as outpatient. Just missed daughter, will call her.    S: Pt feeling tired. Stent not bothering her much, but she wants foley out.  Objective: Vital signs in last 24 hours: Temp:  [97.8 F (36.6 C)-99.7 F (37.6 C)] 99.6 F (37.6 C) (11/16 0548) Pulse Rate:  [52-75] 71 (11/16 0548) Resp:  [16-23] 18 (11/16 0548) BP: (108-119)/(42-55) 119/55 mmHg (11/16 0548) SpO2:  [83 %-98 %] 93 % (11/16 0738)  Intake/Output from previous day: 11/15 0701 - 11/16 0700 In: 3500 [I.V.:3500] Out: 1130 [Urine:1130] Intake/Output this shift: Total I/O In: 240 [P.O.:240] Out: -   Physical Exam:  NAD Resting in bed GU - urine clear Ext - no calf pain or swelling, scd's in place.    Lab Results:  Recent Labs  10/17/15 1503 10/18/15 0410 10/19/15 0533  HGB 10.1* 9.4* 9.8*  HCT 29.7* 28.4* 30.1*   BMET  Recent Labs  10/18/15 0410 10/19/15 0533  NA 135 140  K 3.7 3.7  CL 108 111  CO2 20* 22  GLUCOSE 158* 121*  BUN 21* 13  CREATININE 1.07* 0.97  CALCIUM 7.8* 7.9*    Recent Labs  10/18/15 0410  INR 1.33   No results for input(s): LABURIN in the last 72 hours. Results for orders placed or performed during the hospital encounter of 10/17/15  Culture, blood (routine x 2)     Status: None (Preliminary result)   Collection Time: 10/17/15  3:05 PM  Result Value Ref Range Status   Specimen Description BLOOD RIGHT ARM  Final   Special Requests BOTTLES DRAWN AEROBIC AND ANAEROBIC 5CC  Final   Culture   Final    NO GROWTH < 24 HOURS Performed at [facility]    Report Status PENDING  Incomplete  Culture, blood (routine x 2)     Status: None (Preliminary result)   Collection Time: 10/17/15  3:20 PM  Result Value Ref Range Status   Specimen Description BLOOD LEFT WRIST  Final   Special Requests BOTTLES DRAWN AEROBIC AND ANAEROBIC 4CC  Final    Culture   Final    NO GROWTH < 24 HOURS Performed at [facility]    Report Status PENDING  Incomplete  Urine culture     Status: None   Collection Time: 10/17/15  3:47 PM  Result Value Ref Range Status   Specimen Description URINE, CLEAN CATCH  Final   Special Requests NONE  Final   Culture   Final    >=100,000 COLONIES/mL KLEBSIELLA PNEUMONIAE Performed at [facility]    Report Status 10/19/2015 FINAL  Final   Organism ID, Bacteria KLEBSIELLA PNEUMONIAE  Final      Susceptibility   Klebsiella pneumoniae - MIC*    AMPICILLIN 16 RESISTANT Resistant     CEFAZOLIN <=4 SENSITIVE Sensitive     CEFTRIAXONE <=1 SENSITIVE Sensitive     CIPROFLOXACIN <=0.25 SENSITIVE Sensitive     GENTAMICIN <=1 SENSITIVE Sensitive     IMIPENEM <=0.25 SENSITIVE Sensitive     NITROFURANTOIN 32 SENSITIVE Sensitive     TRIMETH/SULFA <=20 SENSITIVE Sensitive     AMPICILLIN/SULBACTAM 4 SENSITIVE Sensitive     PIP/TAZO <=4 SENSITIVE Sensitive     * >=100,000 COLONIES/mL KLEBSIELLA PNEUMONIAE  MRSA PCR Screening     Status: None   Collection Time: 10/17/15  7:06 PM  Result Value  Ref Range Status   MRSA by PCR NEGATIVE NEGATIVE Final    Comment:        The GeneXpert MRSA Assay (FDA approved for NASAL specimens only), is one component of a comprehensive MRSA colonization surveillance program. It is not intended to diagnose MRSA infection nor to guide or monitor treatment for MRSA infections.     Studies/Results: Dg Chest 2 View  10/17/2015  CLINICAL DATA:  Fever.  Posterior right chest wall pain. EXAM: CHEST  2 VIEW COMPARISON:  03/16/2015 FINDINGS: Heart size and pulmonary vascularity are normal. Prior CABG. Slight atelectasis at the right lung base laterally. No infiltrates or effusions. No acute osseous abnormality. Chronic thoracic scoliosis. IMPRESSION: Minimal atelectasis at the right lung base. Electronically Signed   By: Lorriane Shire M.D.   On: 10/17/2015 14:06   Ct Renal  Stone Study  10/17/2015  CLINICAL DATA:  Right flank pain.  Nausea and vomiting. EXAM: CT ABDOMEN AND PELVIS WITHOUT CONTRAST TECHNIQUE: Multidetector CT imaging of the abdomen and pelvis was performed following the standard protocol without IV contrast. COMPARISON:  None FINDINGS: Lower chest: Small bilateral pleural effusions and mild interstitial prominence is noted. RCA coronary artery calcification and thoracic aortic atherosclerotic calcifications noted. Hepatobiliary: No suspicious liver abnormality identified. The gallbladder appears normal. No biliary dilatation. Pancreas: Normal appearance of the pancreas. Spleen: The spleen is negative. Adrenals/Urinary Tract: The adrenal glands are negative. There is right-sided nephro megaly, perinephric fat stranding and hydronephrosis. Stone within the inferior pole of right kidney measures 4 mm. Right-sided hydroureter and periureteral fat stranding noted. Distal right ureteral calculus measures 5 mm. No stone identified within the urinary bladder. Left kidney is normal. Stomach/Bowel: The stomach is within normal limits. The small bowel loops have a normal course and caliber. No obstruction. Normal appearance of the colon. Vascular/Lymphatic: Calcified atherosclerotic disease involves the abdominal aorta. No aneurysm. No enlarged retroperitoneal or mesenteric adenopathy. No enlarged pelvic or inguinal lymph nodes. Reproductive: Uterus in the adnexal structures have a normal appearance for patient's age. Other: Small amount of free fluid identified within the dependent portion of the pelvis. Musculoskeletal: Degenerative disc disease noted within the lumbar spine. This is most advanced at T12-L1. There is an anterolisthesis of L4 on L5 noted. IMPRESSION: 1. Right-sided hydronephrosis and hydroureter secondary to 5 mm distal ureteral calculus. 2. Aortic atherosclerosis as well as coronary artery calcification. 3. Small amount of pleural fluid and interstitial edema  noted within the lung bases. Correlate for any clinical signs or symptoms of CHF. Electronically Signed   By: Kerby Moors M.D.   On: 10/17/2015 19:22        LOS: 2 days   Keiara Sneeringer 10/19/2015, 12:33 PM

## 2015-10-20 LAB — CBC
HCT: 30.3 % — ABNORMAL LOW (ref 36.0–46.0)
Hemoglobin: 9.9 g/dL — ABNORMAL LOW (ref 12.0–15.0)
MCH: 29 pg (ref 26.0–34.0)
MCHC: 32.7 g/dL (ref 30.0–36.0)
MCV: 88.9 fL (ref 78.0–100.0)
PLATELETS: 337 10*3/uL (ref 150–400)
RBC: 3.41 MIL/uL — ABNORMAL LOW (ref 3.87–5.11)
RDW: 14.3 % (ref 11.5–15.5)
WBC: 9.7 10*3/uL (ref 4.0–10.5)

## 2015-10-20 LAB — BASIC METABOLIC PANEL
Anion gap: 7 (ref 5–15)
BUN: 10 mg/dL (ref 6–20)
CHLORIDE: 110 mmol/L (ref 101–111)
CO2: 23 mmol/L (ref 22–32)
CREATININE: 0.88 mg/dL (ref 0.44–1.00)
Calcium: 8.1 mg/dL — ABNORMAL LOW (ref 8.9–10.3)
GFR calc Af Amer: >60 mL/min (ref >60)
Glucose, Bld: 104 mg/dL — ABNORMAL HIGH (ref 65–99)
Potassium: 3.7 mmol/L (ref 3.5–5.1)
SODIUM: 140 mmol/L (ref 135–145)

## 2015-10-20 MED ORDER — HYDRALAZINE HCL 20 MG/ML IJ SOLN: 10.0000 mg | Freq: Four times a day (QID) | INTRAMUSCULAR | Status: DC | PRN

## 2015-10-20 MED ORDER — VANCOMYCIN HCL 500 MG IV SOLR
500.0000 mg | Freq: Two times a day (BID) | INTRAVENOUS | Status: DC
  Administered 2015-10-20 – 2015-10-21 (×3): 500 mg via INTRAVENOUS
  Filled 2015-10-20 (×4): qty 500

## 2015-10-20 MED ORDER — DIPHENHYDRAMINE-ZINC ACETATE 2-0.1 % EX CREA
TOPICAL_CREAM | Freq: Three times a day (TID) | CUTANEOUS | Status: DC | PRN
  Administered 2015-10-20 (×2): via TOPICAL
  Filled 2015-10-20 (×2): qty 28

## 2015-10-20 NOTE — Progress Notes (Signed)
[facility]                                                                              Patient Demographics  Erica [name], is a 74 y.o. female, DOB - 08/05/1941, [id number]  Admit date - 10/17/2015   Admitting Physician Lavina Hamman, MD  Outpatient Primary MD for the patient is Mayra Neer, MD  LOS - 3   Chief Complaint  Patient presents with  . Fever      HPI on 10/17/2015 by Dr. Berle Mull Erica [name] is a 74 y.o. female with Past medical history of coronary artery disease, hypertension, hypothyroidism. The patient presents with complaints of 9 days off fever with chills and generalized malaise. Patient was significantly tired and fatigued on initial 2 day and has been sleeping for the whole day. Patient called her PCP and she was prescribed Tamiflu. Despite taking that she did not improve and therefore decided to come to the hospital. Patient was found to be hypertensive. In the admission and was recommended for admission. Patient denies any recent change in her medication no sick contact of nausea and vomiting no diarrhea. Patient also denies any urinary symptoms. Patient complains of right-sided flank pain whenever her fever occurs. The patient is coming from home. At her baseline ambulates without support And is independent for most of her ADL; manages her medication on her own.  Assessment & Plan   Sepsis secondary to UTI/obstructive uropathy -Upon admission, patient was febrile with tachypnea and leukocytosis -WBC count improving to 9.7 from 17 -CT showed right-sided hydronephrosis and hydroureter secondary 5 mm distal ureteral calculus -UA showed TNTC WBC, moderate leukocytes, many bacteria -Urine culture >100K Klebsiella pneumonia mainly pan sensitive -Blood cultures: 1/2 GPR -Continue ceftriaxone, added vancomycin -Spoke with Dr. Junious Silk, urology, who recommended 10 days of oral antibiotics, with outpatient follow-up in 2-3 weeks -Given that  patient continues to feel feverish, will continue IV antibiotics  ?Bacteremia -1/2 Blood cultures +GPR, pending final report  -Suspect contamination  -patient started on vancomycin  -Will repeat blood cultures, WBC and fever have normalized  Essential Hypertension -Atenolol held, blood pressures have been soft  Coronary artery disease -Currently chest pain-free, continue aspirin  Acute kidney injury -Likely secondary to obstructive uropathy -Creatinine improved, currently 0.88  Abdominal bruit -Patient has calcified abdominal aorta with no aneurysm -Echocardiogram does not show any acute valvular disease  Chronic Diastolic dysfunction/aortic murmur -Echocardiogram negative for any vegetation, EF [id number], grade 2 diastolic dysfunction -Patient appears to be euvolemic -Discontinued IVF  Code Status: full  Family Communication: Daughter at bedside  Disposition Plan: Admitted.  Likely discharge in the next 24 hours   Time Spent in minutes   30 minutes  Procedures  Cystourethroscopy, right ureteral stent placement  Consults   Urology   DVT Prophylaxis  heparin   Lab Results  Component Value Date   PLT 337 10/20/2015    Medications  Scheduled Meds: . aspirin EC  81 mg Oral Daily  . atorvastatin  40 mg Oral q1800  . cefTRIAXone (ROCEPHIN)  IV  1 g Intravenous Q24H  . heparin  5,000 Units Subcutaneous 3 times per day  . levothyroxine  100  mcg Oral QAC breakfast  . sodium chloride  3 mL Intravenous Q12H  . vancomycin  500 mg Intravenous BID   Continuous Infusions:   PRN Meds:.acetaminophen **OR** acetaminophen, diphenhydrAMINE-zinc acetate, fentaNYL (SUBLIMAZE) injection, meperidine (DEMEROL) injection, ondansetron **OR** ondansetron (ZOFRAN) IV, promethazine  Antibiotics    Anti-infectives    Start     Dose/Rate Route Frequency Ordered Stop   10/20/15 0500  vancomycin (VANCOCIN) 500 mg in sodium chloride 0.9 % 100 mL IVPB     500 mg 100 mL/hr over 60  Minutes Intravenous 2 times daily 10/20/15 0423     10/19/15 1600  cefTRIAXone (ROCEPHIN) 1 g in dextrose 5 % 50 mL IVPB     1 g 100 mL/hr over 30 Minutes Intravenous Every 24 hours 10/18/15 1743     10/18/15 1600  cefTRIAXone (ROCEPHIN) 2 g in dextrose 5 % 50 mL IVPB  Status:  Discontinued     2 g 100 mL/hr over 30 Minutes Intravenous Every 24 hours 10/17/15 1943 10/17/15 2013   10/18/15 1400  vancomycin (VANCOCIN) 500 mg in sodium chloride 0.9 % 100 mL IVPB  Status:  Discontinued     500 mg 100 mL/hr over 60 Minutes Intravenous Every 24 hours 10/17/15 1702 10/18/15 1107   10/18/15 1200  vancomycin (VANCOCIN) 500 mg in sodium chloride 0.9 % 100 mL IVPB  Status:  Discontinued     500 mg 100 mL/hr over 60 Minutes Intravenous 2 times daily 10/18/15 1107 10/18/15 1738   10/18/15 0400  cefTRIAXone (ROCEPHIN) 2 g in dextrose 5 % 50 mL IVPB  Status:  Discontinued     2 g 100 mL/hr over 30 Minutes Intravenous Every 12 hours 10/17/15 2013 10/18/15 1743   10/17/15 1615  vancomycin (VANCOCIN) IVPB 1000 mg/200 mL premix     1,000 mg 200 mL/hr over 60 Minutes Intravenous STAT 10/17/15 1602 10/17/15 1717   10/17/15 1600  cefTRIAXone (ROCEPHIN) 2 g in dextrose 5 % 50 mL IVPB     2 g 100 mL/hr over 30 Minutes Intravenous  Once 10/17/15 1549 10/17/15 1626   10/17/15 1550  vancomycin (VANCOCIN) 1 GM/200ML IVPB    Comments:  Beckey Rutter   : cabinet override      10/17/15 1550 10/18/15 0359      Subjective:   Erica [name] seen and examined today.  Patient states she was feeling feverish   Objective:   Filed Vitals:   10/19/15 1500 10/19/15 2029 10/19/15 2103 10/20/15 0425  BP: 135/47 91/62 110/52 148/52  Pulse: 77 60  64  Temp: 98.4 F (36.9 C) 98.2 F (36.8 C)  98.2 F (36.8 C)  TempSrc: Oral Oral  Oral  Resp: 18 18  18   Height:      Weight:      SpO2: 92% 93%  97%    Wt Readings from Last 3 Encounters:  10/18/15 62.1 kg (136 lb 14.5 oz)  08/06/15 57.153 kg (126 lb)  12/09/14  62.596 kg (138 lb)     Intake/Output Summary (Last 24 hours) at 10/20/15 1131 Last data filed at 10/20/15 0700  Gross per 24 hour  Intake    990 ml  Output    950 ml  Net     40 ml    Exam  General: Well developed, well nourished, NAD  HEENT: NCAT,mucous membranes moist.   Cardiovascular: S1 S2 auscultated, 2/6 SEM, RRR  Respiratory: Clear to auscultation   Abdomen: Soft, nontender, nondistended, + bowel sounds  Extremities: warm  dry without cyanosis clubbing or edema  Neuro: AAOx3, nonfocal  Psych: Normal affect and demeanor   Data Review   Micro Results Recent Results (from the past 240 hour(s))  Culture, blood (routine x 2)     Status: None (Preliminary result)   Collection Time: 10/17/15  3:05 PM  Result Value Ref Range Status   Specimen Description BLOOD RIGHT ARM  Final   Special Requests BOTTLES DRAWN AEROBIC AND ANAEROBIC 5CC  Final   Culture  Setup Time   Final    GRAM POSITIVE RODS AEROBIC BOTTLE ONLY CRITICAL RESULT CALLED TO, READ BACK BY AND VERIFIED WITH: Roger Kill RN [id number] 10/20/15 A BROWNING    Culture   Final    NO GROWTH 2 DAYS Performed at [facility]    Report Status PENDING  Incomplete  Culture, blood (routine x 2)     Status: None (Preliminary result)   Collection Time: 10/17/15  3:20 PM  Result Value Ref Range Status   Specimen Description BLOOD LEFT WRIST  Final   Special Requests BOTTLES DRAWN AEROBIC AND ANAEROBIC 4CC  Final   Culture   Final    NO GROWTH 2 DAYS Performed at [facility]    Report Status PENDING  Incomplete  Urine culture     Status: None   Collection Time: 10/17/15  3:47 PM  Result Value Ref Range Status   Specimen Description URINE, CLEAN CATCH  Final   Special Requests NONE  Final   Culture   Final    >=100,000 COLONIES/mL KLEBSIELLA PNEUMONIAE Performed at [facility]    Report Status 10/19/2015 FINAL  Final   Organism ID, Bacteria KLEBSIELLA PNEUMONIAE  Final      Susceptibility     Klebsiella pneumoniae - MIC*    AMPICILLIN 16 RESISTANT Resistant     CEFAZOLIN <=4 SENSITIVE Sensitive     CEFTRIAXONE <=1 SENSITIVE Sensitive     CIPROFLOXACIN <=0.25 SENSITIVE Sensitive     GENTAMICIN <=1 SENSITIVE Sensitive     IMIPENEM <=0.25 SENSITIVE Sensitive     NITROFURANTOIN 32 SENSITIVE Sensitive     TRIMETH/SULFA <=20 SENSITIVE Sensitive     AMPICILLIN/SULBACTAM 4 SENSITIVE Sensitive     PIP/TAZO <=4 SENSITIVE Sensitive     * >=100,000 COLONIES/mL KLEBSIELLA PNEUMONIAE  MRSA PCR Screening     Status: None   Collection Time: 10/17/15  7:06 PM  Result Value Ref Range Status   MRSA by PCR NEGATIVE NEGATIVE Final    Comment:        The GeneXpert MRSA Assay (FDA approved for NASAL specimens only), is one component of a comprehensive MRSA colonization surveillance program. It is not intended to diagnose MRSA infection nor to guide or monitor treatment for MRSA infections.     Radiology Reports Dg Chest 2 View  10/17/2015  CLINICAL DATA:  Fever.  Posterior right chest wall pain. EXAM: CHEST  2 VIEW COMPARISON:  03/16/2015 FINDINGS: Heart size and pulmonary vascularity are normal. Prior CABG. Slight atelectasis at the right lung base laterally. No infiltrates or effusions. No acute osseous abnormality. Chronic thoracic scoliosis. IMPRESSION: Minimal atelectasis at the right lung base. Electronically Signed   By: Lorriane Shire M.D.   On: 10/17/2015 14:06   Ct Renal Stone Study  10/17/2015  CLINICAL DATA:  Right flank pain.  Nausea and vomiting. EXAM: CT ABDOMEN AND PELVIS WITHOUT CONTRAST TECHNIQUE: Multidetector CT imaging of the abdomen and pelvis was performed following the standard protocol without IV contrast.  COMPARISON:  None FINDINGS: Lower chest: Small bilateral pleural effusions and mild interstitial prominence is noted. RCA coronary artery calcification and thoracic aortic atherosclerotic calcifications noted. Hepatobiliary: No suspicious liver abnormality  identified. The gallbladder appears normal. No biliary dilatation. Pancreas: Normal appearance of the pancreas. Spleen: The spleen is negative. Adrenals/Urinary Tract: The adrenal glands are negative. There is right-sided nephro megaly, perinephric fat stranding and hydronephrosis. Stone within the inferior pole of right kidney measures 4 mm. Right-sided hydroureter and periureteral fat stranding noted. Distal right ureteral calculus measures 5 mm. No stone identified within the urinary bladder. Left kidney is normal. Stomach/Bowel: The stomach is within normal limits. The small bowel loops have a normal course and caliber. No obstruction. Normal appearance of the colon. Vascular/Lymphatic: Calcified atherosclerotic disease involves the abdominal aorta. No aneurysm. No enlarged retroperitoneal or mesenteric adenopathy. No enlarged pelvic or inguinal lymph nodes. Reproductive: Uterus in the adnexal structures have a normal appearance for patient's age. Other: Small amount of free fluid identified within the dependent portion of the pelvis. Musculoskeletal: Degenerative disc disease noted within the lumbar spine. This is most advanced at T12-L1. There is an anterolisthesis of L4 on L5 noted. IMPRESSION: 1. Right-sided hydronephrosis and hydroureter secondary to 5 mm distal ureteral calculus. 2. Aortic atherosclerosis as well as coronary artery calcification. 3. Small amount of pleural fluid and interstitial edema noted within the lung bases. Correlate for any clinical signs or symptoms of CHF. Electronically Signed   By: Kerby Moors M.D.   On: 10/17/2015 19:22    CBC  Recent Labs Lab 10/17/15 1503 10/18/15 0410 10/19/15 0533 10/20/15 0530  WBC 17.5* 14.7* 12.1* 9.7  HGB 10.1* 9.4* 9.8* 9.9*  HCT 29.7* 28.4* 30.1* 30.3*  PLT 303 298 317 337  MCV 85.6 88.5 87.5 88.9  MCH 29.1 29.3 28.5 29.0  MCHC 34.0 33.1 32.6 32.7  RDW 13.8 14.0 14.1 14.3  LYMPHSABS 0.5*  --  0.9  --   MONOABS 1.6*  --  0.8  --    EOSABS 0.0  --  0.1  --   BASOSABS 0.0  --  0.0  --     Chemistries   Recent Labs Lab 10/17/15 1503 10/18/15 0410 10/19/15 0533 10/20/15 0530  NA 135 135 140 140  K 3.8 3.7 3.7 3.7  CL 102 108 111 110  CO2 21* 20* 22 23  GLUCOSE 147* 158* 121* 104*  BUN 27* 21* 13 10  CREATININE 1.46* 1.07* 0.97 0.88  CALCIUM 8.2* 7.8* 7.9* 8.1*  AST 19 20 33  --   ALT 23 22 33  --   ALKPHOS 72 65 72  --   BILITOT 0.7 0.7 0.2*  --    ------------------------------------------------------------------------------------------------------------------ estimated creatinine clearance is 48.6 mL/min (by C-G formula based on Cr of 0.88). ------------------------------------------------------------------------------------------------------------------ No results for input(s): HGBA1C in the last 72 hours. ------------------------------------------------------------------------------------------------------------------ No results for input(s): CHOL, HDL, LDLCALC, TRIG, CHOLHDL, LDLDIRECT in the last 72 hours. ------------------------------------------------------------------------------------------------------------------ No results for input(s): TSH, T4TOTAL, T3FREE, THYROIDAB in the last 72 hours.  Invalid input(s): FREET3 ------------------------------------------------------------------------------------------------------------------ No results for input(s): VITAMINB12, FOLATE, FERRITIN, TIBC, IRON, RETICCTPCT in the last 72 hours.  Coagulation profile  Recent Labs Lab 10/18/15 0410  INR 1.33    No results for input(s): DDIMER in the last 72 hours.  Cardiac Enzymes No results for input(s): CKMB, TROPONINI, MYOGLOBIN in the last 168 hours.  Invalid input(s): CK ------------------------------------------------------------------------------------------------------------------ Invalid input(s): POCBNP    Ajee Heasley D.O. on 10/20/2015 at 11:31 AM  Between 7am to 7pm - Pager -  [phone]  After 7pm go to [email or web address] - password [email or web address]  And look for the night coverage person covering for me after hours  [facility] Office  [phone]

## 2015-10-20 NOTE — Progress Notes (Signed)
CRITICAL VALUE ALERT  Critical value received:  Blood cultures positive in aerobic bottle gram + rods  Date of notification:  10/20/2015  Time of notification:  0330  Critical value read back:Yes.    Nurse who received alert:  Carnella Guadalajara I  MD notified (1st page):  Lamar Blinks, NP  Time of first page:  [phone]  MD notified (2nd page):  Time of second page:  Responding MD:  Lamar Blinks  NP put in new antibiotics orders

## 2015-10-20 NOTE — Progress Notes (Signed)
ANTIBIOTIC CONSULT NOTE - Vancomycin  Pharmacy Consult for Vancomycin Indication: GPR  No Known Allergies  Patient Measurements: Height: 5\' 2"  (157.5 cm) Weight: 136 lb 14.5 oz (62.1 kg) IBW/kg (Calculated) : 50.1  Vital Signs: Temp: 98.2 F (36.8 C) (11/16 2029) Temp Source: Oral (11/16 2029) BP: 110/52 mmHg (11/16 2103) Pulse Rate: 60 (11/16 2029) Intake/Output from previous day: 11/16 0701 - 11/17 0700 In: 1130 [P.O.:480; I.V.:600; IV Piggyback:50] Out: 950 [Urine:950] Intake/Output from this shift: Total I/O In: -  Out: 400 [Urine:400]  Labs:  Recent Labs  10/17/15 1503 10/18/15 0410 10/19/15 0533  WBC 17.5* 14.7* 12.1*  HGB 10.1* 9.4* 9.8*  PLT 303 298 317  CREATININE 1.46* 1.07* 0.97   Estimated Creatinine Clearance: 44.1 mL/min (by C-G formula based on Cr of 0.97). No results for input(s): VANCOTROUGH, VANCOPEAK, VANCORANDOM, GENTTROUGH, GENTPEAK, GENTRANDOM, TOBRATROUGH, TOBRAPEAK, TOBRARND, AMIKACINPEAK, AMIKACINTROU, AMIKACIN in the last 72 hours.   Microbiology: Recent Results (from the past 720 hour(s))  Culture, blood (routine x 2)     Status: None (Preliminary result)   Collection Time: 10/17/15  3:05 PM  Result Value Ref Range Status   Specimen Description BLOOD RIGHT ARM  Final   Special Requests BOTTLES DRAWN AEROBIC AND ANAEROBIC 5CC  Final   Culture  Setup Time   Final    GRAM POSITIVE RODS AEROBIC BOTTLE ONLY CRITICAL RESULT CALLED TO, READ BACK BY AND VERIFIED WITH: Roger Kill RN [id number] 10/20/15 A BROWNING    Culture   Final    NO GROWTH 2 DAYS Performed at [facility]    Report Status PENDING  Incomplete  Culture, blood (routine x 2)     Status: None (Preliminary result)   Collection Time: 10/17/15  3:20 PM  Result Value Ref Range Status   Specimen Description BLOOD LEFT WRIST  Final   Special Requests BOTTLES DRAWN AEROBIC AND ANAEROBIC 4CC  Final   Culture   Final    NO GROWTH 2 DAYS Performed at [facility]    Report Status PENDING  Incomplete  Urine culture     Status: None   Collection Time: 10/17/15  3:47 PM  Result Value Ref Range Status   Specimen Description URINE, CLEAN CATCH  Final   Special Requests NONE  Final   Culture   Final    >=100,000 COLONIES/mL KLEBSIELLA PNEUMONIAE Performed at [facility]    Report Status 10/19/2015 FINAL  Final   Organism ID, Bacteria KLEBSIELLA PNEUMONIAE  Final      Susceptibility   Klebsiella pneumoniae - MIC*    AMPICILLIN 16 RESISTANT Resistant     CEFAZOLIN <=4 SENSITIVE Sensitive     CEFTRIAXONE <=1 SENSITIVE Sensitive     CIPROFLOXACIN <=0.25 SENSITIVE Sensitive     GENTAMICIN <=1 SENSITIVE Sensitive     IMIPENEM <=0.25 SENSITIVE Sensitive     NITROFURANTOIN 32 SENSITIVE Sensitive     TRIMETH/SULFA <=20 SENSITIVE Sensitive     AMPICILLIN/SULBACTAM 4 SENSITIVE Sensitive     PIP/TAZO <=4 SENSITIVE Sensitive     * >=100,000 COLONIES/mL KLEBSIELLA PNEUMONIAE  MRSA PCR Screening     Status: None   Collection Time: 10/17/15  7:06 PM  Result Value Ref Range Status   MRSA by PCR NEGATIVE NEGATIVE Final    Comment:        The GeneXpert MRSA Assay (FDA approved for NASAL specimens only), is one component of a comprehensive MRSA colonization surveillance program. It is not intended to diagnose MRSA infection  nor to guide or monitor treatment for MRSA infections.     Medical History: Past Medical History  Diagnosis Date  . Coronary artery disease     status post multiple percutaneous interventions  . Hypertension   . Hyperlipidemia   . Hypothyroidism   . Osteoarthritis     Assessment: 35 y/oF with PMH of CAD, HTN, HLD, hypothyroidism, OA who presents with fever x 9 days, body aches, n/v, diarrhea, and R flank pain. Per EDP, heart murmur heard on physical exam. UA turbid with many bacteria, nitrite negative, moderate leukocytes, TNTC WBC. Code sepsis initiated in ED. Ceftriaxone 2g IV x 1 given in ED, and pharmacy consulted to  assist with dosing of Vancomycin for sepsis, ? endocarditis, ? UTI.   11/14 >> Ceftriaxone >> 11/14 >> Vancomycin >>   11/15 restarted 11/17  11/14 blood: NG to date 11/14 urine: GNR   SCr 0.97 (improved) with CrCl ~ 44 ml/min CG    Goal of Therapy:  Appropriate antibiotic dosing for renal function Eradication of infection  Plan:   Vancomycin 500mg  IV q12h  Monitor cultures, clinical course.   Dorrene German 10/20/2015 4:25 AM

## 2015-10-20 NOTE — Care Management Important Message (Signed)
Important Message  Patient Details  Name: Erica [name] MRN: [id number] Date of Birth: [birth year]   Medicare Important Message Given:  Yes    Camillo Flaming 10/20/2015, 2:49 [place] Message  Patient Details  Name: Erica [name] MRN: [id number] Date of Birth: 1941/06/06   Medicare Important Message Given:  Yes    Camillo Flaming 10/20/2015, 2:49 PM

## 2015-10-20 NOTE — Progress Notes (Signed)
A/P -  Right ureteral stone, sepsis - s/p right ureteral stent. Voiding spontaneously after foley catheter removal. No documented fevers in 48 hours. Urine Cx +pan sensitive klebsiella. On ceftriaxone and vanc IV.  Blood cultures 1/2 +gram positive rods this morning.   - Agree with treatment of UTI and bacteremia per primary team - Keep patient on 2-3 weeks of antibiotics until next urologic procedure - Plan ureteroscopy as outpatient.  - Follow up requested in 2-3 weeks for pre-op. - Urology will sign off now.    S: Pt feeling well. Felt "feverish" overnight with no documented fevers in 48 hours. She reports does not feel comfortable discharging to home given that she feels warm. Stent not bothering her much. Voiding spontaneously after foley removal.   Objective: Vital signs in last 24 hours: Temp:  [98.2 F (36.8 C)-98.4 F (36.9 C)] 98.2 F (36.8 C) (11/17 0425) Pulse Rate:  [60-77] 64 (11/17 0425) Resp:  [18] 18 (11/17 0425) BP: (91-148)/(47-62) 148/52 mmHg (11/17 0425) SpO2:  [92 %-97 %] 97 % (11/17 0425)  Intake/Output from previous day: 11/16 0701 - 11/17 0700 In: 1230 [P.O.:480; I.V.:600; IV Piggyback:150] Out: 950 [Urine:950] Intake/Output this shift:    Physical Exam:  NAD Resting in bed GU - No CVA tenderness overnight Ext - no calf pain or swelling, scd's in place.    Lab Results:  Recent Labs  10/18/15 0410 10/19/15 0533 10/20/15 0530  HGB 9.4* 9.8* 9.9*  HCT 28.4* 30.1* 30.3*   BMET  Recent Labs  10/19/15 0533 10/20/15 0530  NA 140 140  K 3.7 3.7  CL 111 110  CO2 22 23  GLUCOSE 121* 104*  BUN 13 10  CREATININE 0.97 0.88  CALCIUM 7.9* 8.1*    Recent Labs  10/18/15 0410  INR 1.33   No results for input(s): LABURIN in the last 72 hours. Results for orders placed or performed during the hospital encounter of 10/17/15  Culture, blood (routine x 2)     Status: None (Preliminary result)   Collection Time: 10/17/15  3:05 PM  Result Value  Ref Range Status   Specimen Description BLOOD RIGHT ARM  Final   Special Requests BOTTLES DRAWN AEROBIC AND ANAEROBIC 5CC  Final   Culture  Setup Time   Final    GRAM POSITIVE RODS AEROBIC BOTTLE ONLY CRITICAL RESULT CALLED TO, READ BACK BY AND VERIFIED WITH: Roger Kill RN [id number] 10/20/15 A BROWNING    Culture   Final    NO GROWTH 2 DAYS Performed at [facility]    Report Status PENDING  Incomplete  Culture, blood (routine x 2)     Status: None (Preliminary result)   Collection Time: 10/17/15  3:20 PM  Result Value Ref Range Status   Specimen Description BLOOD LEFT WRIST  Final   Special Requests BOTTLES DRAWN AEROBIC AND ANAEROBIC 4CC  Final   Culture   Final    NO GROWTH 2 DAYS Performed at [facility]    Report Status PENDING  Incomplete  Urine culture     Status: None   Collection Time: 10/17/15  3:47 PM  Result Value Ref Range Status   Specimen Description URINE, CLEAN CATCH  Final   Special Requests NONE  Final   Culture   Final    >=100,000 COLONIES/mL KLEBSIELLA PNEUMONIAE Performed at [facility]    Report Status 10/19/2015 FINAL  Final   Organism ID, Bacteria KLEBSIELLA PNEUMONIAE  Final  Susceptibility   Klebsiella pneumoniae - MIC*    AMPICILLIN 16 RESISTANT Resistant     CEFAZOLIN <=4 SENSITIVE Sensitive     CEFTRIAXONE <=1 SENSITIVE Sensitive     CIPROFLOXACIN <=0.25 SENSITIVE Sensitive     GENTAMICIN <=1 SENSITIVE Sensitive     IMIPENEM <=0.25 SENSITIVE Sensitive     NITROFURANTOIN 32 SENSITIVE Sensitive     TRIMETH/SULFA <=20 SENSITIVE Sensitive     AMPICILLIN/SULBACTAM 4 SENSITIVE Sensitive     PIP/TAZO <=4 SENSITIVE Sensitive     * >=100,000 COLONIES/mL KLEBSIELLA PNEUMONIAE  MRSA PCR Screening     Status: None   Collection Time: 10/17/15  7:06 PM  Result Value Ref Range Status   MRSA by PCR NEGATIVE NEGATIVE Final    Comment:        The GeneXpert MRSA Assay (FDA approved for NASAL specimens only), is one component of  a comprehensive MRSA colonization surveillance program. It is not intended to diagnose MRSA infection nor to guide or monitor treatment for MRSA infections.     Studies/Results: No results found.      LOS: 3 days   Erica [name] 10/20/2015, 7:33 AM

## 2015-10-21 DIAGNOSIS — R109 Unspecified abdominal pain: Secondary | ICD-10-CM

## 2015-10-21 LAB — BASIC METABOLIC PANEL
ANION GAP: 7 (ref 5–15)
BUN: 8 mg/dL (ref 6–20)
CALCIUM: 8.2 mg/dL — AB (ref 8.9–10.3)
CO2: 25 mmol/L (ref 22–32)
Chloride: 110 mmol/L (ref 101–111)
Creatinine, Ser: 0.79 mg/dL (ref 0.44–1.00)
GFR calc Af Amer: >60 mL/min (ref >60)
GLUCOSE: 115 mg/dL — AB (ref 65–99)
POTASSIUM: 3.2 mmol/L — AB (ref 3.5–5.1)
Sodium: 142 mmol/L (ref 135–145)

## 2015-10-21 LAB — CBC
HEMATOCRIT: 29.8 % — AB (ref 36.0–46.0)
Hemoglobin: 9.8 g/dL — ABNORMAL LOW (ref 12.0–15.0)
MCH: 28.8 pg (ref 26.0–34.0)
MCHC: 32.9 g/dL (ref 30.0–36.0)
MCV: 87.6 fL (ref 78.0–100.0)
PLATELETS: 351 10*3/uL (ref 150–400)
RBC: 3.4 MIL/uL — ABNORMAL LOW (ref 3.87–5.11)
RDW: 13.9 % (ref 11.5–15.5)
WBC: 9.1 10*3/uL (ref 4.0–10.5)

## 2015-10-21 LAB — GLUCOSE, CAPILLARY
Glucose-Capillary: 112 mg/dL — ABNORMAL HIGH (ref 65–99)
Glucose-Capillary: 92 mg/dL (ref 65–99)

## 2015-10-21 LAB — CULTURE, BLOOD (ROUTINE X 2)

## 2015-10-21 MED ORDER — POTASSIUM CHLORIDE 20 MEQ/15ML (10%) PO SOLN
40.0000 meq | Freq: Once | ORAL | Status: AC
  Administered 2015-10-21: 40 meq via ORAL
  Filled 2015-10-21: qty 30

## 2015-10-21 MED ORDER — CEPHALEXIN 500 MG PO CAPS: 500.0000 mg | ORAL_CAPSULE | Freq: Two times a day (BID) | ORAL | Status: DC

## 2015-10-21 NOTE — Progress Notes (Signed)
CRITICAL VALUE ALERT  Critical value received:  Gram Neg Rods  Date of notification:  10/21/15  Time of notification:  [id number]  Critical value read back: Yes  Nurse who received alert:  Tilda Franco, RN  MD notified (1st page):  Mikhail  Time of first page:  [phone]  MD notified (2nd page):  Time of second page:  Responding MD:    Time MD responded:

## 2015-10-21 NOTE — Progress Notes (Signed)
10/21/15  1300  Reviewed discharge instructions with patient. Patient verbalized understanding of discharge instructions. Copy of discharge instructions given to patient.

## 2015-10-21 NOTE — Discharge Summary (Addendum)
Physician Discharge Summary  Erica [name] [id number] DOB: 07-01-1941 DOA: 10/17/2015  PCP: Mayra Neer, MD  Admit date: 10/17/2015 Discharge date: 10/21/2015  Time spent: 45 minutes  Recommendations for Outpatient Follow-up:  Patient will be discharged to home.  Patient will need to follow up with primary care provider within one week of discharge, repeat CBC and BMP.  Patient will need follow up with Dr. Junious Silk in 2-3 weeks.  Patient should continue medications as prescribed.  Patient should follow a heart healthy diet.   Discharge Diagnoses:  Sepsis secondary to UTI/obstructive uropathy Questionable bacteremia likely contamination Essential hypertension  Coronary artery disease  Acute kidney injury Abdominal bruit Chronic diastolic dysfunction/aortic murmur  Discharge Condition: Stable  Diet recommendation: heart healthy  Filed Weights   10/17/15 1834 10/18/15 0500 10/21/15 0519  Weight: 58.5 kg (128 lb 15.5 oz) 62.1 kg (136 lb 14.5 oz) 62.4 kg (137 lb 9.1 oz)    History of present illness:  on 10/17/2015 by Dr. Berle Mull Erica [name] is a 74 y.o. female with Past medical history of coronary artery disease, hypertension, hypothyroidism. The patient presents with complaints of 9 days off fever with chills and generalized malaise. Patient was significantly tired and fatigued on initial 2 day and has been sleeping for the whole day. Patient called her PCP and she was prescribed Tamiflu. Despite taking that she did not improve and therefore decided to come to the hospital. Patient was found to be hypertensive. In the admission and was recommended for admission. Patient denies any recent change in her medication no sick contact of nausea and vomiting no diarrhea. Patient also denies any urinary symptoms. Patient complains of right-sided flank pain whenever her fever occurs. The patient is coming from home. At her baseline ambulates without support And is independent  for most of her ADL; manages her medication on her own.  Hospital Course:  Sepsis secondary to UTI/obstructive uropathy -Upon admission, patient was febrile with tachypnea and leukocytosis -WBC count improving to 9.1 from 17 -CT showed right-sided hydronephrosis and hydroureter secondary 5 mm distal ureteral calculus -UA showed TNTC WBC, moderate leukocytes, many bacteria -Urine culture >100K Klebsiella pneumonia mainly pan sensitive -Blood cultures: 1/2 GPR -Was placed on ceftriaxone, added vancomycin -Spoke with Dr. Junious Silk, urology, who recommended 10 days of oral antibiotics, with outpatient follow-up in 2-3 weeks -Patient has been afebrile for over 24 hours. -Will discharge patient with keflex 500mg  BID for 3 weeks  ?Bacteremia -1/2 Blood cultures +GPR, pending final report  -Spoke with microbiology, likely diphtheria -Suspect contamination  -patient was started on vancomycin  -WBC and fever have normalized  Essential Hypertension -Atenolol held, blood pressures have been soft  Coronary artery disease -Currently chest pain-free, continue aspirin  Acute kidney injury -Likely secondary to obstructive uropathy -Creatinine improved, currently 0.79  Abdominal bruit -Patient has calcified abdominal aorta with no aneurysm -Echocardiogram does not show any acute valvular disease  Chronic Diastolic dysfunction/aortic murmur -Echocardiogram negative for any vegetation, EF [id number], grade 2 diastolic dysfunction -Patient appears to be euvolemic  Procedures  Cystourethroscopy, right ureteral stent placement  Consults  Urology   Discharge Exam: Filed Vitals:   10/21/15 0519  BP: 111/48  Pulse: 65  Temp: 97.9 F (36.6 C)  Resp: 18   Exam  General: Well developed, well nourished, NAD  HEENT: NCAT,mucous membranes moist.   Cardiovascular: S1 S2 auscultated, 2/6 SEM, RRR  Respiratory: Clear to auscultation  Abdomen: Soft, nontender, nondistended, + bowel  sounds  Extremities: warm dry without  cyanosis clubbing or edema  Neuro: AAOx3, nonfocal  Psych: Normal affect and demeanor, pleasant  Discharge Instructions     Medication List    STOP taking these medications        atenolol 25 MG tablet  Commonly known as:  TENORMIN     TAMIFLU 75 MG capsule  Generic drug:  oseltamivir      TAKE these medications        aspirin 81 MG tablet  Take 1 tablet (81 mg total) by mouth daily.     atorvastatin 40 MG tablet  Commonly known as:  LIPITOR  Take 40 mg by mouth daily at 6 PM.     colesevelam 625 MG tablet  Commonly known as:  WELCHOL  Take 1,875 mg by mouth 2 (two) times daily with a meal.     ibuprofen 200 MG tablet  Commonly known as:  ADVIL,MOTRIN  Take 400 mg by mouth every 6 (six) hours as needed for fever, headache or moderate pain.     levothyroxine 100 MCG tablet  Commonly known as:  SYNTHROID, LEVOTHROID  Take 100 mcg by mouth daily.     nitroGLYCERIN 0.4 MG SL tablet  Commonly known as:  NITROSTAT  Place 1 tablet (0.4 mg total) under the tongue every 5 (five) minutes as needed for chest pain.     vitamin C 1000 MG tablet  Take 2,000 mg by mouth daily.       No Known Allergies     Follow-up Information    Follow up with [facility], MATTHEW, MD.   Specialty:  Urology   Why:  2 - 3 weeks    Contact information:   [place] [address] [phone]       Follow up with SHAW,KIMBERLEE, MD. Schedule an appointment as soon as possible for a visit in 1 week.   Specialty:  Family Medicine   Why:  Hospital follow up    Contact information:   [address] [place] [address] [phone]        The results of significant diagnostics from this hospitalization (including imaging, microbiology, ancillary and laboratory) are listed below for reference.    Significant Diagnostic Studies: Dg Chest 2 View  10/17/2015  CLINICAL DATA:  Fever.  Posterior right chest wall pain.  EXAM: CHEST  2 VIEW COMPARISON:  03/16/2015 FINDINGS: Heart size and pulmonary vascularity are normal. Prior CABG. Slight atelectasis at the right lung base laterally. No infiltrates or effusions. No acute osseous abnormality. Chronic thoracic scoliosis. IMPRESSION: Minimal atelectasis at the right lung base. Electronically Signed   By: Lorriane Shire M.D.   On: 10/17/2015 14:06   Ct Renal Stone Study  10/17/2015  CLINICAL DATA:  Right flank pain.  Nausea and vomiting. EXAM: CT ABDOMEN AND PELVIS WITHOUT CONTRAST TECHNIQUE: Multidetector CT imaging of the abdomen and pelvis was performed following the standard protocol without IV contrast. COMPARISON:  None FINDINGS: Lower chest: Small bilateral pleural effusions and mild interstitial prominence is noted. RCA coronary artery calcification and thoracic aortic atherosclerotic calcifications noted. Hepatobiliary: No suspicious liver abnormality identified. The gallbladder appears normal. No biliary dilatation. Pancreas: Normal appearance of the pancreas. Spleen: The spleen is negative. Adrenals/Urinary Tract: The adrenal glands are negative. There is right-sided nephro megaly, perinephric fat stranding and hydronephrosis. Stone within the inferior pole of right kidney measures 4 mm. Right-sided hydroureter and periureteral fat stranding noted. Distal right ureteral calculus measures 5 mm. No stone identified within  the urinary bladder. Left kidney is normal. Stomach/Bowel: The stomach is within normal limits. The small bowel loops have a normal course and caliber. No obstruction. Normal appearance of the colon. Vascular/Lymphatic: Calcified atherosclerotic disease involves the abdominal aorta. No aneurysm. No enlarged retroperitoneal or mesenteric adenopathy. No enlarged pelvic or inguinal lymph nodes. Reproductive: Uterus in the adnexal structures have a normal appearance for patient's age. Other: Small amount of free fluid identified within the dependent portion  of the pelvis. Musculoskeletal: Degenerative disc disease noted within the lumbar spine. This is most advanced at T12-L1. There is an anterolisthesis of L4 on L5 noted. IMPRESSION: 1. Right-sided hydronephrosis and hydroureter secondary to 5 mm distal ureteral calculus. 2. Aortic atherosclerosis as well as coronary artery calcification. 3. Small amount of pleural fluid and interstitial edema noted within the lung bases. Correlate for any clinical signs or symptoms of CHF. Electronically Signed   By: Kerby Moors M.D.   On: 10/17/2015 19:22    Microbiology: Recent Results (from the past 240 hour(s))  Culture, blood (routine x 2)     Status: None (Preliminary result)   Collection Time: 10/17/15  3:05 PM  Result Value Ref Range Status   Specimen Description BLOOD RIGHT ARM  Final   Special Requests BOTTLES DRAWN AEROBIC AND ANAEROBIC 5CC  Final   Culture  Setup Time   Final    GRAM POSITIVE RODS AEROBIC BOTTLE ONLY CRITICAL RESULT CALLED TO, READ BACK BY AND VERIFIED WITH: Roger Kill RN [id number] 10/20/15 A BROWNING    Culture   Final    CULTURE REINCUBATED FOR BETTER GROWTH Performed at [facility]    Report Status PENDING  Incomplete  Culture, blood (routine x 2)     Status: None (Preliminary result)   Collection Time: 10/17/15  3:20 PM  Result Value Ref Range Status   Specimen Description BLOOD LEFT WRIST  Final   Special Requests BOTTLES DRAWN AEROBIC AND ANAEROBIC 4CC  Final   Culture   Final    NO GROWTH 3 DAYS Performed at [facility]    Report Status PENDING  Incomplete  Urine culture     Status: None   Collection Time: 10/17/15  3:47 PM  Result Value Ref Range Status   Specimen Description URINE, CLEAN CATCH  Final   Special Requests NONE  Final   Culture   Final    >=100,000 COLONIES/mL KLEBSIELLA PNEUMONIAE Performed at [facility] [address] [facility]    Report Status 10/19/2015 FINAL  Final   Organism ID, Bacteria KLEBSIELLA PNEUMONIAE  Final      Susceptibility    Klebsiella pneumoniae - MIC*    AMPICILLIN 16 RESISTANT Resistant     CEFAZOLIN <=4 SENSITIVE Sensitive     CEFTRIAXONE <=1 SENSITIVE Sensitive     CIPROFLOXACIN <=0.25 SENSITIVE Sensitive     GENTAMICIN <=1 SENSITIVE Sensitive     IMIPENEM <=0.25 SENSITIVE Sensitive     NITROFURANTOIN 32 SENSITIVE Sensitive     TRIMETH/SULFA <=20 SENSITIVE Sensitive     AMPICILLIN/SULBACTAM 4 SENSITIVE Sensitive     PIP/TAZO <=4 SENSITIVE Sensitive     * >=100,000 COLONIES/mL KLEBSIELLA PNEUMONIAE  MRSA PCR Screening     Status: None   Collection Time: 10/17/15  7:06 PM  Result Value Ref Range Status   MRSA by PCR NEGATIVE NEGATIVE Final    Comment:        The GeneXpert MRSA Assay (FDA approved for NASAL specimens only), is one component of a comprehensive MRSA colonization  surveillance program. It is not intended to diagnose MRSA infection nor to guide or monitor treatment for MRSA infections.      Labs: Basic Metabolic Panel:  Recent Labs Lab 10/17/15 1503 10/18/15 0410 10/19/15 0533 10/20/15 0530 10/21/15 0441  NA 135 135 140 140 142  K 3.8 3.7 3.7 3.7 3.2*  CL 102 108 111 110 110  CO2 21* 20* 22 23 25   GLUCOSE 147* 158* 121* 104* 115*  BUN 27* 21* 13 10 8   CREATININE 1.46* 1.07* 0.97 0.88 0.79  CALCIUM 8.2* 7.8* 7.9* 8.1* 8.2*   Liver Function Tests:  Recent Labs Lab 10/17/15 1503 10/18/15 0410 10/19/15 0533  AST 19 20 33  ALT 23 22 33  ALKPHOS 72 65 72  BILITOT 0.7 0.7 0.2*  PROT 6.1* 5.3* 5.3*  ALBUMIN 2.6* 2.3* 2.2*   No results for input(s): LIPASE, AMYLASE in the last 168 hours. No results for input(s): AMMONIA in the last 168 hours. CBC:  Recent Labs Lab 10/17/15 1503 10/18/15 0410 10/19/15 0533 10/20/15 0530 10/21/15 0441  WBC 17.5* 14.7* 12.1* 9.7 9.1  NEUTROABS 15.4*  --  10.3*  --   --   HGB 10.1* 9.4* 9.8* 9.9* 9.8*  HCT 29.7* 28.4* 30.1* 30.3* 29.8*  MCV 85.6 88.5 87.5 88.9 87.6  PLT 303 298 317 337 351   Cardiac Enzymes: No results for  input(s): CKTOTAL, CKMB, CKMBINDEX, TROPONINI in the last 168 hours. BNP: BNP (last 3 results) No results for input(s): BNP in the last 8760 hours.  ProBNP (last 3 results) No results for input(s): PROBNP in the last 8760 hours.  CBG:  Recent Labs Lab 10/21/15 0714  GLUCAP 112*       Signed:  Cristal Ford  [facility] 10/21/2015, 10:08 AM  Addendum: Patient discharged today.  Received call later stating that patient had GNR from 11/14.  Spoke with Dr. Tommy Medal, ID, who felt this could also be another contaminant, however, would treat with Keflex 500mg  QID for an additional 6 days as patient received 4 days of ceftriaxone inpatient- 10 day total of coverage for GNR.    Spoke with patient who was very understanding.  Called in additional keflex to [facility].

## 2015-10-23 LAB — CULTURE, BLOOD (ROUTINE X 2)

## 2015-10-25 DIAGNOSIS — I119 Hypertensive heart disease without heart failure: Secondary | ICD-10-CM | POA: Diagnosis not present

## 2015-10-25 DIAGNOSIS — D72829 Elevated white blood cell count, unspecified: Secondary | ICD-10-CM | POA: Diagnosis not present

## 2015-10-25 DIAGNOSIS — N139 Obstructive and reflux uropathy, unspecified: Secondary | ICD-10-CM | POA: Diagnosis not present

## 2015-10-25 DIAGNOSIS — N179 Acute kidney failure, unspecified: Secondary | ICD-10-CM | POA: Diagnosis not present

## 2015-10-25 DIAGNOSIS — D649 Anemia, unspecified: Secondary | ICD-10-CM | POA: Diagnosis not present

## 2015-10-25 DIAGNOSIS — N201 Calculus of ureter: Secondary | ICD-10-CM | POA: Diagnosis not present

## 2015-10-25 DIAGNOSIS — Z960 Presence of urogenital implants: Secondary | ICD-10-CM | POA: Diagnosis not present

## 2015-10-25 DIAGNOSIS — A419 Sepsis, unspecified organism: Secondary | ICD-10-CM | POA: Diagnosis not present

## 2015-10-25 LAB — CULTURE, BLOOD (ROUTINE X 2)
CULTURE: NO GROWTH
Culture: NO GROWTH

## 2015-11-10 DIAGNOSIS — N201 Calculus of ureter: Secondary | ICD-10-CM | POA: Diagnosis not present

## 2015-11-11 ENCOUNTER — Other Ambulatory Visit: Payer: Self-pay | Admitting: Urology

## 2015-11-15 DIAGNOSIS — D649 Anemia, unspecified: Secondary | ICD-10-CM | POA: Diagnosis not present

## 2015-11-15 DIAGNOSIS — F4321 Adjustment disorder with depressed mood: Secondary | ICD-10-CM | POA: Diagnosis not present

## 2015-11-15 DIAGNOSIS — I119 Hypertensive heart disease without heart failure: Secondary | ICD-10-CM | POA: Diagnosis not present

## 2015-11-15 DIAGNOSIS — F322 Major depressive disorder, single episode, severe without psychotic features: Secondary | ICD-10-CM | POA: Diagnosis not present

## 2015-11-15 DIAGNOSIS — N139 Obstructive and reflux uropathy, unspecified: Secondary | ICD-10-CM | POA: Diagnosis not present

## 2015-11-15 DIAGNOSIS — R197 Diarrhea, unspecified: Secondary | ICD-10-CM | POA: Diagnosis not present

## 2015-11-23 ENCOUNTER — Encounter (HOSPITAL_BASED_OUTPATIENT_CLINIC_OR_DEPARTMENT_OTHER): Payer: Self-pay | Admitting: *Deleted

## 2015-11-23 DIAGNOSIS — D72829 Elevated white blood cell count, unspecified: Secondary | ICD-10-CM | POA: Insufficient documentation

## 2015-11-23 NOTE — Progress Notes (Signed)
To [facility] [employer]  on arrival-CBC,Ekg,Cxr,cardiac reports with chart-reviewed w/Dr B.Judd-no additional information needed.Instructed NPO after MN-will take atenolol,thyroid meds in am with small amt water. Primary Dr Raliegh Ip.Brigitte Pulse had repeated CBC today due to prior elevated WBC,has now completed antibiotic.Instructed to call  Dr Junious Silk if WBC results remain elevated(was expecting to receive results later today).

## 2015-11-28 NOTE — Anesthesia Preprocedure Evaluation (Signed)
Anesthesia Evaluation  Patient identified by MRN, date of birth, ID band Patient awake    Reviewed: Allergy & Precautions, NPO status , Patient's Chart, lab work & pertinent test results  Airway Mallampati: II  TM Distance: >3 FB Neck ROM: Full    Dental  (+) Upper Dentures, Dental Advisory Given   Pulmonary former smoker,    Pulmonary exam normal breath sounds clear to auscultation       Cardiovascular hypertension, Pt. on medications + CAD, + Cardiac Stents and + CABG (2011)  Normal cardiovascular exam Rhythm:Regular Rate:Normal  Diastolic dysfunction   Neuro/Psych negative neurological ROS  negative psych ROS   GI/Hepatic negative GI ROS, Neg liver ROS,   Endo/Other  negative endocrine ROSHypothyroidism   Renal/GU negative Renal ROS  negative genitourinary   Musculoskeletal negative musculoskeletal ROS (+)   Abdominal   Peds negative pediatric ROS (+)  Hematology negative hematology ROS (+)   Anesthesia Other Findings   Reproductive/Obstetrics negative OB ROS                             Anesthesia Physical Anesthesia Plan  ASA: III  Anesthesia Plan: General   Post-op Pain Management:    Induction: Intravenous  Airway Management Planned: LMA  Additional Equipment:   Intra-op Plan:   Post-operative Plan:   Informed Consent:   Plan Discussed with: Surgeon  Anesthesia Plan Comments:         Anesthesia Quick Evaluation

## 2015-11-29 ENCOUNTER — Other Ambulatory Visit: Payer: Self-pay | Admitting: Cardiovascular Disease

## 2015-11-29 ENCOUNTER — Ambulatory Visit (HOSPITAL_BASED_OUTPATIENT_CLINIC_OR_DEPARTMENT_OTHER): Payer: Commercial Managed Care - HMO | Admitting: Anesthesiology

## 2015-11-29 ENCOUNTER — Encounter (HOSPITAL_BASED_OUTPATIENT_CLINIC_OR_DEPARTMENT_OTHER)
Admission: RE | Disposition: A | Discharge status: Home / Self Care | Payer: Self-pay | Source: Ambulatory Visit | Attending: Urology

## 2015-11-29 ENCOUNTER — Encounter (HOSPITAL_BASED_OUTPATIENT_CLINIC_OR_DEPARTMENT_OTHER): Payer: Self-pay | Admitting: *Deleted

## 2015-11-29 ENCOUNTER — Ambulatory Visit (HOSPITAL_BASED_OUTPATIENT_CLINIC_OR_DEPARTMENT_OTHER)
Admission: RE | Admit: 2015-11-29 | Discharge: 2015-11-29 | Disposition: A | Discharge status: Home / Self Care | Payer: Commercial Managed Care - HMO | Source: Ambulatory Visit | Attending: Urology | Admitting: Urology

## 2015-11-29 DIAGNOSIS — I10 Essential (primary) hypertension: Secondary | ICD-10-CM | POA: Insufficient documentation

## 2015-11-29 DIAGNOSIS — Z87891 Personal history of nicotine dependence: Secondary | ICD-10-CM | POA: Diagnosis not present

## 2015-11-29 DIAGNOSIS — I252 Old myocardial infarction: Secondary | ICD-10-CM | POA: Insufficient documentation

## 2015-11-29 DIAGNOSIS — F329 Major depressive disorder, single episode, unspecified: Secondary | ICD-10-CM | POA: Insufficient documentation

## 2015-11-29 DIAGNOSIS — Z792 Long term (current) use of antibiotics: Secondary | ICD-10-CM | POA: Insufficient documentation

## 2015-11-29 DIAGNOSIS — N200 Calculus of kidney: Secondary | ICD-10-CM

## 2015-11-29 DIAGNOSIS — Z7982 Long term (current) use of aspirin: Secondary | ICD-10-CM | POA: Diagnosis not present

## 2015-11-29 DIAGNOSIS — N202 Calculus of kidney with calculus of ureter: Secondary | ICD-10-CM | POA: Diagnosis present

## 2015-11-29 DIAGNOSIS — Z79899 Other long term (current) drug therapy: Secondary | ICD-10-CM | POA: Insufficient documentation

## 2015-11-29 DIAGNOSIS — I2510 Atherosclerotic heart disease of native coronary artery without angina pectoris: Secondary | ICD-10-CM | POA: Diagnosis not present

## 2015-11-29 DIAGNOSIS — Z951 Presence of aortocoronary bypass graft: Secondary | ICD-10-CM | POA: Diagnosis not present

## 2015-11-29 DIAGNOSIS — Z955 Presence of coronary angioplasty implant and graft: Secondary | ICD-10-CM | POA: Insufficient documentation

## 2015-11-29 DIAGNOSIS — Z87442 Personal history of urinary calculi: Secondary | ICD-10-CM | POA: Diagnosis not present

## 2015-11-29 DIAGNOSIS — E039 Hypothyroidism, unspecified: Secondary | ICD-10-CM | POA: Diagnosis not present

## 2015-11-29 DIAGNOSIS — N201 Calculus of ureter: Secondary | ICD-10-CM | POA: Diagnosis not present

## 2015-11-29 DIAGNOSIS — E7800 Pure hypercholesterolemia, unspecified: Secondary | ICD-10-CM | POA: Insufficient documentation

## 2015-11-29 DIAGNOSIS — M1990 Unspecified osteoarthritis, unspecified site: Secondary | ICD-10-CM | POA: Diagnosis not present

## 2015-11-29 DIAGNOSIS — I6523 Occlusion and stenosis of bilateral carotid arteries: Secondary | ICD-10-CM

## 2015-11-29 HISTORY — DX: Anxiety disorder, unspecified: F41.9

## 2015-11-29 HISTORY — PX: CYSTOSCOPY/URETEROSCOPY/HOLMIUM LASER/STENT PLACEMENT: SHX6546

## 2015-11-29 LAB — POCT I-STAT, CHEM 8
BUN: 21 mg/dL — ABNORMAL HIGH (ref 6–20)
CALCIUM ION: 1.2 mmol/L (ref 1.13–1.30)
CHLORIDE: 101 mmol/L (ref 101–111)
Creatinine, Ser: 0.8 mg/dL (ref 0.44–1.00)
Glucose, Bld: 137 mg/dL — ABNORMAL HIGH (ref 65–99)
HEMATOCRIT: 37 % (ref 36.0–46.0)
Hemoglobin: 12.6 g/dL (ref 12.0–15.0)
Potassium: 3.5 mmol/L (ref 3.5–5.1)
SODIUM: 142 mmol/L (ref 135–145)
TCO2: 29 mmol/L (ref 0–100)

## 2015-11-29 SURGERY — CYSTOSCOPY/URETEROSCOPY/HOLMIUM LASER/STENT PLACEMENT
Anesthesia: General | Site: Ureter | Laterality: Right

## 2015-11-29 MED ORDER — FENTANYL CITRATE (PF) 100 MCG/2ML IJ SOLN
INTRAMUSCULAR | Status: AC
  Filled 2015-11-29: qty 2

## 2015-11-29 MED ORDER — CEFAZOLIN SODIUM-DEXTROSE 2-3 GM-% IV SOLR
INTRAVENOUS | Status: AC
  Filled 2015-11-29: qty 50

## 2015-11-29 MED ORDER — IOHEXOL 350 MG/ML SOLN
INTRAVENOUS | Status: DC | PRN
  Administered 2015-11-29: 15 mL

## 2015-11-29 MED ORDER — KETOROLAC TROMETHAMINE 30 MG/ML IJ SOLN
INTRAMUSCULAR | Status: DC | PRN
  Administered 2015-11-29: 30 mg via INTRAVENOUS

## 2015-11-29 MED ORDER — ONDANSETRON HCL 4 MG/2ML IJ SOLN
INTRAMUSCULAR | Status: DC | PRN
  Administered 2015-11-29: 4 mg via INTRAVENOUS

## 2015-11-29 MED ORDER — LIDOCAINE HCL (CARDIAC) 20 MG/ML IV SOLN
INTRAVENOUS | Status: AC
  Filled 2015-11-29: qty 5

## 2015-11-29 MED ORDER — PROPOFOL 10 MG/ML IV BOLUS
INTRAVENOUS | Status: AC
  Filled 2015-11-29: qty 20

## 2015-11-29 MED ORDER — PROPOFOL 10 MG/ML IV BOLUS
INTRAVENOUS | Status: DC | PRN
  Administered 2015-11-29: 150 mg via INTRAVENOUS

## 2015-11-29 MED ORDER — FENTANYL CITRATE (PF) 100 MCG/2ML IJ SOLN
25.0000 ug | INTRAMUSCULAR | Status: DC | PRN
  Filled 2015-11-29: qty 1

## 2015-11-29 MED ORDER — SODIUM CHLORIDE 0.9 % IR SOLN
Status: DC | PRN
  Administered 2015-11-29: 3000 mL
  Administered 2015-11-29: 1000 mL

## 2015-11-29 MED ORDER — FENTANYL CITRATE (PF) 100 MCG/2ML IJ SOLN
INTRAMUSCULAR | Status: DC | PRN
  Administered 2015-11-29 (×2): 25 ug via INTRAVENOUS
  Administered 2015-11-29: 50 ug via INTRAVENOUS

## 2015-11-29 MED ORDER — CEFAZOLIN SODIUM 1-5 GM-% IV SOLN
1.0000 g | INTRAVENOUS | Status: DC
  Filled 2015-11-29: qty 50

## 2015-11-29 MED ORDER — OXYCODONE-ACETAMINOPHEN 5-325 MG PO TABS: 1.0000 | ORAL_TABLET | Freq: Four times a day (QID) | ORAL | Status: DC | PRN

## 2015-11-29 MED ORDER — CEFAZOLIN SODIUM-DEXTROSE 2-3 GM-% IV SOLR
2.0000 g | INTRAVENOUS | Status: AC
  Administered 2015-11-29: 2 g via INTRAVENOUS
  Filled 2015-11-29: qty 50

## 2015-11-29 MED ORDER — DEXAMETHASONE SODIUM PHOSPHATE 10 MG/ML IJ SOLN
INTRAMUSCULAR | Status: AC
  Filled 2015-11-29: qty 1

## 2015-11-29 MED ORDER — ONDANSETRON HCL 4 MG/2ML IJ SOLN
INTRAMUSCULAR | Status: AC
  Filled 2015-11-29: qty 2

## 2015-11-29 MED ORDER — LIDOCAINE HCL (CARDIAC) 20 MG/ML IV SOLN
INTRAVENOUS | Status: DC | PRN
  Administered 2015-11-29: 60 mg via INTRAVENOUS

## 2015-11-29 MED ORDER — LACTATED RINGERS IV SOLN
INTRAVENOUS | Status: DC
  Administered 2015-11-29 (×2): via INTRAVENOUS
  Filled 2015-11-29: qty 1000

## 2015-11-29 MED ORDER — MIDAZOLAM HCL 2 MG/2ML IJ SOLN
INTRAMUSCULAR | Status: AC
  Filled 2015-11-29: qty 2

## 2015-11-29 MED ORDER — KETOROLAC TROMETHAMINE 30 MG/ML IJ SOLN
INTRAMUSCULAR | Status: AC
  Filled 2015-11-29: qty 1

## 2015-11-29 MED ORDER — NITROFURANTOIN MONOHYD MACRO 100 MG PO CAPS: 100.0000 mg | ORAL_CAPSULE | Freq: Every day | ORAL | Status: DC

## 2015-11-29 MED ORDER — DEXAMETHASONE SODIUM PHOSPHATE 4 MG/ML IJ SOLN
INTRAMUSCULAR | Status: DC | PRN
  Administered 2015-11-29: 8 mg via INTRAVENOUS

## 2015-11-29 SURGICAL SUPPLY — 40 items
ADAPTER CATH URET PLST 4-6FR (CATHETERS) IMPLANT
ADPR CATH URET STRL DISP 4-6FR (CATHETERS)
BAG DRAIN URO-CYSTO SKYTR STRL (DRAIN) ×3 IMPLANT
BAG DRN UROCATH (DRAIN) ×2
BASKET LASER NITINOL 1.9FR (BASKET) IMPLANT
BASKET STNLS GEMINI 4WIRE 3FR (BASKET) IMPLANT
BASKET ZERO TIP NITINOL 2.4FR (BASKET) IMPLANT
BSKT STON RTRVL 120 1.9FR (BASKET)
BSKT STON RTRVL GEM 120X11 3FR (BASKET)
BSKT STON RTRVL ZERO TP 2.4FR (BASKET)
CATH INTERMIT  6FR 70CM (CATHETERS) ×2 IMPLANT
CATH URET 5FR 28IN CONE TIP (BALLOONS)
CATH URET 5FR 28IN OPEN ENDED (CATHETERS) IMPLANT
CATH URET 5FR 70CM CONE TIP (BALLOONS) IMPLANT
CATH URET DUAL LUMEN 6-10FR 50 (CATHETERS) IMPLANT
CLOTH BEACON ORANGE TIMEOUT ST (SAFETY) ×3 IMPLANT
GLOVE BIO SURGEON STRL SZ 6.5 (GLOVE) ×2 IMPLANT
GLOVE BIO SURGEON STRL SZ7.5 (GLOVE) ×3 IMPLANT
GLOVE INDICATOR 6.5 STRL GRN (GLOVE) ×4 IMPLANT
GOWN STRL REUS W/ TWL LRG LVL3 (GOWN DISPOSABLE) ×2 IMPLANT
GOWN STRL REUS W/ TWL XL LVL3 (GOWN DISPOSABLE) ×2 IMPLANT
GOWN STRL REUS W/TWL LRG LVL3 (GOWN DISPOSABLE) ×3
GOWN STRL REUS W/TWL XL LVL3 (GOWN DISPOSABLE) ×3
GUIDEWIRE 0.038 PTFE COATED (WIRE) IMPLANT
GUIDEWIRE ANG ZIPWIRE 038X150 (WIRE) IMPLANT
GUIDEWIRE STR DUAL SENSOR (WIRE) ×5 IMPLANT
IV NS 1000ML (IV SOLUTION) ×3
IV NS 1000ML BAXH (IV SOLUTION) ×1 IMPLANT
IV NS IRRIG 3000ML ARTHROMATIC (IV SOLUTION) ×4 IMPLANT
KIT BALLIN UROMAX 15FX10 (LABEL) IMPLANT
KIT BALLN UROMAX 15FX4 (MISCELLANEOUS) IMPLANT
KIT BALLN UROMAX 26 75X4 (MISCELLANEOUS)
KIT ROOM TURNOVER WOR (KITS) ×3 IMPLANT
MANIFOLD NEPTUNE II (INSTRUMENTS) ×2 IMPLANT
PACK CYSTO (CUSTOM PROCEDURE TRAY) ×3 IMPLANT
SET HIGH PRES BAL DIL (LABEL)
SHEATH ACCESS URETERAL 24CM (SHEATH) ×2 IMPLANT
SHEATH ACCESS URETERAL 38CM (SHEATH) IMPLANT
SYRINGE IRR TOOMEY STRL 70CC (SYRINGE) IMPLANT
TUBE CONNECTING 12X1/4 (SUCTIONS) ×2 IMPLANT

## 2015-11-29 NOTE — Anesthesia Postprocedure Evaluation (Signed)
Anesthesia Post Note  Patient: Erica [name]  Procedure(s) Performed: Procedure(s) (LRB): RIGHT URETEROSCOPY/RIGHT RETROGRADE PYELOGRAM/ RIGHT STENT REPLACEMENT (Right)  Patient location during evaluation: PACU Anesthesia Type: General Level of consciousness: awake and alert Pain management: pain level controlled Vital Signs Assessment: post-procedure vital signs reviewed and stable Respiratory status: spontaneous breathing, nonlabored ventilation, respiratory function stable and patient connected to nasal cannula oxygen Cardiovascular status: blood pressure returned to baseline and stable Postop Assessment: no signs of nausea or vomiting Anesthetic complications: no    Last Vitals:  Filed Vitals:   11/29/15 0900 11/29/15 1018  BP: 152/57 178/53  Pulse: 62 63  Temp:  36.8 C  Resp: 17 16    Last Pain: There were no vitals filed for this visit.               Sammie Denner L

## 2015-11-29 NOTE — Op Note (Signed)
Preoperative diagnosis: Right ureteral, right renal stones Postoperative diagnosis: Same  Procedure: Cystoscopy, right ureteroscopy, right retrograde pyelogram, right ureteral stent exchange  Surgeon: Junious Silk  Anesthesia: Gen.  Indication for procedure: 74 year old and underwent an urgent right ureteral stent for urinary tract infection and septic picture with a 6 mm right distal stone, there is also a 4 mm right lower pole stone. She was brought today for definitive stone management.  Findings: On cystoscopy the urethra and the bladder was normal. The urine was quite clear. The stent was very clean. There were no stones located in the ureter or collecting system on careful inspection, retrograde and reinspection. There were evidence of crystallization at most of the papilla.  Right retrograde pyelogram: This outlined a collecting system and renal pelvis that was mildly dilated without filling defect.  Description of procedure: After consent was obtained the patient was brought to the operating room. After adequate anesthesia she was placed in lithotomy position and prepped and draped in the usual sterile fashion. A timeout was performed to confirm the patient and procedure. A cystoscope was passed per urethra and the bladder emptied and refilled 3. The bladder was drained and the right stent was grasped and removed through the urethral meatus. A sensor wire was advanced through the stent and coiled in the collecting system. A semirigid ureteroscope was advanced and the distal mid and proximal ureter were all normal and stone free. A second wire was advanced under direct vision and a short access sheath placed without difficulty. The digital ureteroscope was advanced in the collecting system inspected. There was 1 papilla in the lower pole anteriorly was difficult to get to. A retrograde pyelogram was obtained through the cystoscope to map the collecting system and ensure complete inspection.  Calyces were then inspected again. The scope was retroflexed the opposite way and I was able to look into the lower pole papilla where the stone was on the CT but no stone was located. There were some small stone fragments at this location. There were some crystallization at most of the other renal papilla but no stones. Final inspection revealed again no stones. The renal pelvis was clear. The ureter and access sheath were backed out together and the ureter inspected and noted to be stone free and no injuries. The wire was backloaded on the cystoscope and a 6 x 24 7 m stent was advanced. The wire was removed with a good coil seen in the kidney and a good coil in the bladder. She was awakened and taken to recovery room in stable condition.   Complications: None  Blood loss: Minimal  Specimens: None   Drains: 6 x 24 cm right ureteral stent with string

## 2015-11-29 NOTE — Addendum Note (Signed)
Addendum  created 11/29/15 1028 by Suan Halter, CRNA   Modules edited: Anesthesia Flowsheet

## 2015-11-29 NOTE — Transfer of Care (Signed)
Immediate Anesthesia Transfer of Care Note  Patient: Erica [name]  Procedure(s) Performed: Procedure(s) (LRB): RIGHT URETEROSCOPY/RIGHT RETROGRADE PYELOGRAM/ RIGHT STENT REPLACEMENT (Right)  Patient Location: PACU  Anesthesia Type: General  Level of Consciousness: awake, oriented, sedated and patient cooperative  Airway & Oxygen Therapy: Patient Spontanous Breathing and Patient connected to face mask oxygen  Post-op Assessment: Report given to PACU RN and Post -op Vital signs reviewed and stable  Post vital signs: Reviewed and stable  Complications: No apparent anesthesia complications

## 2015-11-29 NOTE — Interval H&P Note (Signed)
History and Physical Interval Note:  11/29/2015 7:28 AM  Erica [name]  has presented today for surgery, with the diagnosis of RIGHT URETERAL STONE  The various methods of treatment have been discussed with the patient and family. After consideration of risks, benefits and other options for treatment, the patient has consented to  Procedure(s): RIGHT URETEROSCOPY/HOLMIUM LASER/STENT PLACEMENT (Right) HOLMIUM LASER APPLICATION (Right) as a surgical intervention . Pt with right ureter and right LP stone. She has been well. No dysuria or fever.  The patient's history has been reviewed, patient examined, no change in status, stable for surgery.  I have reviewed the patient's chart and labs.  Questions were answered to the patient's satisfaction.  Discussed post-op stent, hopefully with tether. She elects to proceed.    Randall Rampersad

## 2015-11-29 NOTE — Anesthesia Procedure Notes (Signed)
Procedure Name: LMA Insertion Date/Time: 11/29/2015 7:38 AM Performed by: Denna Haggard D Pre-anesthesia Checklist: Patient identified, Emergency Drugs available, Suction available and Patient being monitored Patient Re-evaluated:Patient Re-evaluated prior to inductionOxygen Delivery Method: Circle System Utilized Preoxygenation: Pre-oxygenation with 100% oxygen Intubation Type: IV induction Ventilation: Mask ventilation without difficulty LMA: LMA inserted LMA Size: 4.0 Number of attempts: 1 Airway Equipment and Method: Bite block Placement Confirmation: positive ETCO2 Tube secured with: Tape Dental Injury: Teeth and Oropharynx as per pre-operative assessment

## 2015-11-29 NOTE — Discharge Instructions (Signed)
Ureteral Stent Implantation, Care After Refer to this sheet in the next few weeks. These instructions provide you with information on caring for yourself after your procedure. Your health care provider may also give you more specific instructions. Your treatment has been planned according to current medical practices, but problems sometimes occur. Call your health care provider if you have any problems or questions after your procedure. WHAT TO EXPECT AFTER THE PROCEDURE You should be back to normal activity within 48 hours after the procedure. Nausea and vomiting may occur and are commonly the result of anesthesia. It is common to experience sharp pain in the back or lower abdomen and penis with voiding. This is caused by movement of the ends of the stent with the act of urinating.It usually goes away within minutes after you have stopped urinating. HOME CARE INSTRUCTIONS Make sure to drink plenty of fluids. You may have small amounts of bleeding, causing your urine to be red. This is normal. Certain movements may trigger pain or a feeling that you need to urinate. You may be given medicines to prevent infection or bladder spasms. Be sure to take all medicines as directed. Only take over-the-counter or prescription medicines for pain, discomfort, or fever as directed by your health care provider.   REMOVAL OF THE STENT: Remove the stent by pulling the string with slow steady pressure on the morning of Friday, 12/02/2015.  SEEK MEDICAL CARE IF:  You experience increasing pain.  Your pain medicine is not working. SEEK IMMEDIATE MEDICAL CARE IF:  Your urine is dark red or has blood clots.  You are leaking urine (incontinent).  You have a fever, chills, feeling sick to your stomach (nausea), or vomiting.  Your pain is not relieved by pain medicine.  The end of the stent comes out of the urethra.  You are unable to urinate.   This information is not intended to replace advice given to you  by your health care provider. Make sure you discuss any questions you have with your health care provider.   Document Released: 07/22/2013 Document Revised: 11/24/2013 Document Reviewed: 06/03/2015 Elsevier Interactive Patient Education 2016 [place] [facility] Care Instructions  Activity: Get plenty of rest for the remainder of the day. A responsible adult should stay with you for 24 hours following the procedure.  For the next 24 hours, DO NOT: -Drive a car -Paediatric nurse -Drink alcoholic beverages -Take any medication unless instructed by your physician -Make any legal decisions or sign important papers.  Meals: Start with liquid foods such as gelatin or soup. Progress to regular foods as tolerated. Avoid greasy, spicy, heavy foods. If nausea and/or vomiting occur, drink only clear liquids until the nausea and/or vomiting subsides. Call your physician if vomiting continues.  Special Instructions/Symptoms: Your throat may feel dry or sore from the anesthesia or the breathing tube placed in your throat during surgery. If this causes discomfort, gargle with warm salt water. The discomfort should disappear within 24 hours.  If you had a scopolamine patch placed behind your ear for the management of post- operative nausea and/or vomiting:  1. The medication in the patch is effective for 72 hours, after which it should be removed.  Wrap patch in a tissue and discard in the trash. Wash hands thoroughly with soap and water. 2. You may remove the patch earlier than 72 hours if you experience unpleasant side effects which may include dry mouth, dizziness or visual disturbances. 3. Avoid  touching the patch. Wash your hands with soap and water after contact with the patch.

## 2015-11-29 NOTE — H&P (Signed)
History of Present Illness New patient for right ureteral stone for by Dr. Ree Kida. PCP Dr. Mayra Neer.     Patient underwent an urgent right ureteral stent for sepsis and a Klebsiella UTI (pansensitive except for ampicillin). She had a 5 mm right distal ureteral stone on CT she also has a 4 mm renal stone. The left side was clear.    She continues cephalexin and has been well. She's had no fevers. She has some mild stent discomfort but no significant dysuria. No flank pain. She passed a stone about 20 years ago.     Past Medical History Problems  1. History of arthritis (Z87.39) 2. History of cardiac disorder (Z86.79) 3. History of depression (Z86.59) 4. History of hypercholesterolemia (Z86.39) 5. History of hypothyroidism (Z86.39) 6. History of myocardial infarction (I25.2) 7. History of renal calculi [id number])  Surgical History Problems  1. History of Cystoscopy With Insertion Of Ureteral Stent Right  Current Meds 1. Aspirin 81 MG TABS;  Therapy: (Recorded:08Dec2016) to Recorded 2. Atenolol 25 MG Oral Tablet;  Therapy: (Recorded:08Dec2016) to Recorded 3. Atorvastatin Calcium 40 MG Oral Tablet;  Therapy: (Recorded:08Dec2016) to Recorded 4. Cephalexin 500 MG Oral Capsule;  Therapy: (Recorded:08Dec2016) to Recorded 5. Levothyroxine Sodium 100 MCG Oral Tablet;  Therapy: (Recorded:08Dec2016) to Recorded 6. Nitroglycerin 0.4 MG Sublingual Tablet Sublingual;  Therapy: (Recorded:08Dec2016) to Recorded 7. Xanax 0.5 MG Oral Tablet;  Therapy: (Recorded:08Dec2016) to Recorded 8. Zoloft TABS;  Therapy: (Recorded:08Dec2016) to Recorded  Allergies Medication  1. No Known Drug Allergies  Family History Problems  1. Adopted (Z02.82) 2. Family history unknown (Z78.9) : Father  Social History Problems    Denied: History of Alcohol use   Caffeine use (F15.90)   3 a day   Former smoker (Z87.891)   1 ppd x 37 years   Number of children   2 sons   1 daughter    Retired   Widowed (Z63.4)  Review of Systems Genitourinary, constitutional, skin, eye, otolaryngeal, hematologic/lymphatic, cardiovascular, pulmonary, endocrine, musculoskeletal, gastrointestinal, neurological and psychiatric system(s) were reviewed and pertinent findings if present are noted and are otherwise negative.    Vitals Vital Signs [Data Includes: Last 1 Day]  Recorded: [id number] 10:50AM  Height: 5 ft 2 in Weight: 116 lb  BMI Calculated: 21.22 BSA Calculated: 1.52 Blood Pressure: 131 / 71 Temperature: 97.3 F Heart Rate: 57  Physical Exam Constitutional: Well nourished and well developed . No acute distress.  Pulmonary: No respiratory distress and normal respiratory rhythm and effort.  Cardiovascular: Heart rate and rhythm are normal . No peripheral edema.  Neuro/Psych:. Mood and affect are appropriate.    Results/Data Urine [Data Includes: Last 1 Day]   [id number]  COLOR YELLOW   APPEARANCE CLEAR   SPECIFIC GRAVITY 1.015   pH 5.5   GLUCOSE NEGATIVE   BILIRUBIN NEGATIVE   KETONE NEGATIVE   BLOOD 3+   PROTEIN 1+   NITRITE NEGATIVE   LEUKOCYTE ESTERASE 1+   SQUAMOUS EPITHELIAL/HPF 0-5 HPF  WBC 0-5 WBC/HPF  RBC 10-20 RBC/HPF  BACTERIA FEW HPF  CRYSTALS NONE SEEN HPF  CASTS NONE SEEN LPF  Yeast NONE SEEN HPF   Old records or history reviewed: Epic.  The following images/tracing/specimen were independently visualized:  CT.    Assessment Assessed  1. Right ureteral calculus (N20.1)  Plan  Health Maintenance  1. UA With REFLEX; [Do Not Release]; Status:Complete;   [id number] 10:24AM Right ureteral calculus  2. Follow-up Schedule Surgery Office  Follow-up  Status: Hold For -  Appointment   Requested for: [id number]  Discussion/Summary Right ureteral stone, right renal stone-his urine for repeat culture and we went over the nature risk and benefits of simply removing the stent today or proceeding with ureteroscopy to try and clear the ureter and the  kidney. All questions answered. She elects to proceed with ureteroscopy. We discussed risk of bleeding infection and ureteral injury and need for a stage procedure among others.     Signatures Electronically signed by : Festus Aloe, M.D.; Nov 10 2015 11:18AM EST  Add: urine cx negative

## 2015-11-30 ENCOUNTER — Encounter (HOSPITAL_BASED_OUTPATIENT_CLINIC_OR_DEPARTMENT_OTHER): Payer: Self-pay | Admitting: Urology

## 2015-12-08 ENCOUNTER — Ambulatory Visit (HOSPITAL_COMMUNITY)
Admission: RE | Admit: 2015-12-08 | Discharge: 2015-12-08 | Disposition: A | Discharge status: Home / Self Care | Payer: Commercial Managed Care - HMO | Source: Ambulatory Visit | Attending: Cardiology | Admitting: Cardiology

## 2015-12-08 DIAGNOSIS — I6523 Occlusion and stenosis of bilateral carotid arteries: Secondary | ICD-10-CM

## 2015-12-08 DIAGNOSIS — E785 Hyperlipidemia, unspecified: Secondary | ICD-10-CM | POA: Diagnosis not present

## 2015-12-08 DIAGNOSIS — I10 Essential (primary) hypertension: Secondary | ICD-10-CM | POA: Insufficient documentation

## 2015-12-09 DIAGNOSIS — H25813 Combined forms of age-related cataract, bilateral: Secondary | ICD-10-CM | POA: Diagnosis not present

## 2015-12-09 DIAGNOSIS — H524 Presbyopia: Secondary | ICD-10-CM | POA: Diagnosis not present

## 2015-12-16 ENCOUNTER — Encounter: Payer: Self-pay | Admitting: Cardiovascular Disease

## 2015-12-16 ENCOUNTER — Ambulatory Visit (INDEPENDENT_AMBULATORY_CARE_PROVIDER_SITE_OTHER): Payer: Commercial Managed Care - HMO | Admitting: Cardiovascular Disease

## 2015-12-16 VITALS — BP 138/68 | HR 48 | Ht 62.0 in | Wt 123.4 lb

## 2015-12-16 DIAGNOSIS — I6523 Occlusion and stenosis of bilateral carotid arteries: Secondary | ICD-10-CM

## 2015-12-16 DIAGNOSIS — I2510 Atherosclerotic heart disease of native coronary artery without angina pectoris: Secondary | ICD-10-CM | POA: Diagnosis not present

## 2015-12-16 NOTE — Progress Notes (Signed)
Cardiology Office Note Date:  12/16/2015   ID:  Erica [name], DOB 1941/05/07, MRN [id number]  PCP:  Mayra Neer, MD  Cardiologist:  [name], MD    Chief Complaint  Patient presents with  . Coronary Artery Disease     History of Present Illness: Erica [name] is a 75 y.o. female who presents for follow-up evaluation. The patient is followed for CAD. She underwent CABG in 2011 when she was treated with the LIMA to LAD, sequential vein graft to PLA and PDA, and vein graft obtuse marginal. She also has mild carotid stenosis with 40-59% ICA stenosis on the right and less than 40% ICA stenosis on the left. Hyperlipidemia has been managed by her primary physician.  When she was seen 1 year ago she complained of atypical chest pain. A nuclear stress scan was done and demonstrated no ischemia. She's had a difficult year with multiple hospitalizations this winter. She was found to have sepsis and urinary infection. She feels like she is finally recovering from all of this. Her cardiac situation has remained remarkably stable. She has not had chest pain, shortness of breath, leg swelling, or heart palpitations. She's been monitoring home blood pressures and brings in readings today. She has systolic hypertension with about 50% of her readings between 140 and 150.  Past Medical History  Diagnosis Date  . Hypertension   . Hyperlipidemia   . Hypothyroidism   . Osteoarthritis   . Coronary artery disease     status post multiple percutaneous interventions  . Kidney calculus 2016  . Anxiety     Past Surgical History  Procedure Laterality Date  . Ankle surgery    . Coronary stent placement  2006     right coronary artery with drug-eluting stent  . Coronary stent placement  1998     bare-metal stent to the right coronary artery  . Coronary stent placement  2008    drug-eluting stent placed in the left circumflex  . Cystoscopy with retrograde pyelogram, ureteroscopy and stent  placement Right 10/18/2015    Procedure: CYSTOSCOPY WITH RETROGRADE PYELOGRAM,  AND STENT PLACEMENT;  Surgeon: Festus Aloe, MD;  Location: [facility];  Service: Urology;  Laterality: Right;  . Cardiac catheterization  12-2008  . 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 Cystoscopy/ureteroscopy/holmium laser/stent placement Right 11/29/2015    Procedure: RIGHT URETEROSCOPY/RIGHT RETROGRADE PYELOGRAM/ RIGHT STENT REPLACEMENT;  Surgeon: Festus Aloe, MD;  Location: [facility];  Service: Urology;  Laterality: Right;    Current Outpatient Prescriptions  Medication Sig Dispense Refill  . aspirin 81 MG tablet Take 1 tablet (81 mg total) by mouth daily. 30 tablet 0  . atenolol (TENORMIN) 25 MG tablet Take 25 mg by mouth daily.    Marland Kitchen atorvastatin (LIPITOR) 40 MG tablet Take 40 mg by mouth every morning.     . hydrochlorothiazide (HYDRODIURIL) 25 MG tablet Take 25 mg by mouth daily.    Marland Kitchen ibuprofen (ADVIL,MOTRIN) 200 MG tablet Take 400 mg by mouth every 6 (six) hours as needed for fever, headache or moderate pain.    Marland Kitchen levothyroxine (SYNTHROID, LEVOTHROID) 100 MCG tablet Take 100 mcg by mouth daily.    . nitroGLYCERIN (NITROSTAT) 0.4 MG SL tablet Place 1 tablet (0.4 mg total) under the tongue every 5 (five) minutes as needed for chest pain. 25 tablet 3  . sertraline (ZOLOFT) 50 MG tablet Take 50 mg by mouth daily.     No current facility-administered  medications for this visit.    Allergies:   Review of patient's allergies indicates no known allergies.   Social History:  The patient  reports that she quit smoking about 19 years ago. She does not have any smokeless tobacco history on file. She reports that she does not drink alcohol or use illicit drugs.   Family History:  The patient's  family history includes Other (age of onset: 40) in her mother.    ROS:  Please see the history of present illness.  Otherwise, review of systems is positive for depression, fatigue.  All  other systems are reviewed and negative.    PHYSICAL EXAM: VS:  BP 138/68 mmHg  Pulse 48  Ht 5\' 2"  (1.575 m)  Wt 123 lb 6.4 oz (55.974 kg)  BMI 22.56 kg/m2 , BMI Body mass index is 22.56 kg/(m^2). GEN: Well nourished, well developed, in no acute distress HEENT: normal Neck: no JVD, no masses. No carotid bruits Cardiac: RRR without murmur or gallop                Respiratory:  clear to auscultation bilaterally, normal work of breathing GI: soft, nontender, nondistended, + BS MS: no deformity or atrophy Ext: no pretibial edema, pedal pulses 2+= bilaterally Skin: warm and dry, no rash Neuro:  Strength and sensation are intact Psych: euthymic mood, full affect  EKG:  EKG is ordered today. The ekg ordered today shows sinus bradycardia 49 bpm, otherwise within normal limits.  Recent Labs: 10/19/2015: ALT 33 10/21/2015: Platelets 351 11/29/2015: BUN 21*; Creatinine, Ser 0.80; Hemoglobin 12.6; Potassium 3.5; Sodium 142   Lipid Panel     Component Value Date/Time   CHOL  01/09/2010 0506    131        ATP III CLASSIFICATION:  <200     mg/dL   Desirable  200-239  mg/dL   Borderline High  >=240    mg/dL   High          TRIG 87 01/09/2010 0506   HDL 48 01/09/2010 0506   CHOLHDL 2.7 01/09/2010 0506   VLDL 17 01/09/2010 0506   LDLCALC  01/09/2010 0506    66        Total Cholesterol/HDL:CHD Risk Coronary Heart Disease Risk Table                     Men   Women  1/2 Average Risk   3.4   3.3  Average Risk       5.0   4.4  2 X Average Risk   9.6   7.1  3 X Average Risk  23.4   11.0        Use the calculated Patient Ratio above and the CHD Risk Table to determine the patient's CHD Risk.        ATP III CLASSIFICATION (LDL):  <100     mg/dL   Optimal  100-129  mg/dL   Near or Above                    Optimal  130-159  mg/dL   Borderline  160-189  mg/dL   High  >190     mg/dL   Very High      Wt Readings from Last 3 Encounters:  12/16/15 123 lb 6.4 oz (55.974 kg)    11/29/15 122 lb (55.339 kg)  10/21/15 137 lb 9.1 oz (62.4 kg)     Cardiac Studies Reviewed: Echocardiogram 11/13/2015: Study  Conclusions  - Left ventricle: The cavity size was normal. Wall thickness was increased in a pattern of mild LVH. Systolic function was vigorous. The estimated ejection fraction was in the range of 60% to 65%. Wall motion was normal; there were no regional wall motion abnormalities. Features are consistent with a pseudonormal left ventricular filling pattern, with concomitant abnormal relaxation and increased filling pressure (grade 2 diastolic dysfunction). - Mitral valve: There was mild regurgitation. - Left atrium: The atrium was moderately to severely dilated. - Right ventricle: The cavity size was normal. Wall thickness was normal. Systolic function was mildly reduced. - Right atrium: The atrium was mildly dilated. Central venous pressure (est): 8 mm Hg. - Atrial septum: No defect or patent foramen ovale was identified. - Tricuspid valve: There was mild-moderate regurgitation. - Pulmonary arteries: Systolic pressure was increased. PA peak pressure: 49 mm Hg (S). - Inferior vena cava: The vessel was dilated. The respirophasic diameter changes were in the normal range (>= 50%).  Carotid duplex 12/08/2015: 40-59% bilateral ICA stenosis  Myocardial perfusion scan 12/09/2014: Impression Exercise Capacity: Lexiscan with no exercise. BP Response: Normal blood pressure response. Clinical Symptoms: Mild shortness of breath noted ECG Impression: No significant ST segment change suggestive of ischemia. Comparison with Prior Nuclear Study: No images to compare  Overall Impression: Low risk stress nuclear study with no evidence of ischemia identified..  LV Ejection Fraction: 60%. LV Wall Motion: Septal wall hypokinesis consistent with post bypass changes. Otherwise, normal left ventricular contractile performance.  ASSESSMENT  AND PLAN: 1.  CAD, native vessel, without symptoms of angina: The patient is stable from a cardiac perspective. Her current medicines will be continued. No changes made today. I will see her back in one year.  2. Essential hypertension: Blood pressure control is borderline. Considering all of her recent illnesses, and the fact that her blood pressure elevation is mild, will continue current therapies for now. She is treated with atenolol and hydrochlorothiazide. She will call if blood pressures are consistently elevated.  3. Hyperlipidemia: Patient is treated with atorvastatin 40 mg. Lipids are followed by her primary physician.  4. Carotid stenosis without history of stroke: Stable Doppler by recent study.  Recent data reviewed includes recent carotid Doppler, and echocardiogram and nuclear stress test from last year.  Current medicines are reviewed with the patient today.  The patient does not have concerns regarding medicines.  Labs/ tests ordered today include:   Orders Placed This Encounter  Procedures  . EKG 12-Lead    Disposition:   FU one year  Signed, [name], MD  12/16/2015 11:48 AM    [facility] [place], [place], [place]  [address] Phone: [phone]; Fax: [phone]

## 2015-12-16 NOTE — Patient Instructions (Signed)
Medication Instructions:  No changes today  Labwork: None   Testing/Procedures: None   Follow-Up: Your physician wants you to follow-up in: 6 months with Dr Burt Knack. (July 2017). You will receive a reminder letter in the mail two months in advance. If you don't receive a letter, please call our office to schedule the follow-up appointment.        If you need a refill on your cardiac medications before your next appointment, please call your pharmacy.

## 2016-01-02 DIAGNOSIS — N139 Obstructive and reflux uropathy, unspecified: Secondary | ICD-10-CM | POA: Diagnosis not present

## 2016-01-02 DIAGNOSIS — Z23 Encounter for immunization: Secondary | ICD-10-CM | POA: Diagnosis not present

## 2016-01-02 DIAGNOSIS — F4321 Adjustment disorder with depressed mood: Secondary | ICD-10-CM | POA: Diagnosis not present

## 2016-01-02 DIAGNOSIS — M1990 Unspecified osteoarthritis, unspecified site: Secondary | ICD-10-CM | POA: Diagnosis not present

## 2016-01-02 DIAGNOSIS — E039 Hypothyroidism, unspecified: Secondary | ICD-10-CM | POA: Diagnosis not present

## 2016-01-02 DIAGNOSIS — R7301 Impaired fasting glucose: Secondary | ICD-10-CM | POA: Diagnosis not present

## 2016-01-02 DIAGNOSIS — I119 Hypertensive heart disease without heart failure: Secondary | ICD-10-CM | POA: Diagnosis not present

## 2016-01-11 DIAGNOSIS — N200 Calculus of kidney: Secondary | ICD-10-CM | POA: Diagnosis not present

## 2016-01-11 DIAGNOSIS — Z0000 Encounter for general adult medical examination without abnormal findings: Secondary | ICD-10-CM | POA: Diagnosis not present

## 2016-01-28 DIAGNOSIS — J019 Acute sinusitis, unspecified: Secondary | ICD-10-CM | POA: Diagnosis not present

## 2016-04-04 DIAGNOSIS — I2510 Atherosclerotic heart disease of native coronary artery without angina pectoris: Secondary | ICD-10-CM | POA: Diagnosis not present

## 2016-04-04 DIAGNOSIS — M1990 Unspecified osteoarthritis, unspecified site: Secondary | ICD-10-CM | POA: Diagnosis not present

## 2016-04-04 DIAGNOSIS — I700 Atherosclerosis of aorta: Secondary | ICD-10-CM | POA: Diagnosis not present

## 2016-04-04 DIAGNOSIS — R7301 Impaired fasting glucose: Secondary | ICD-10-CM | POA: Diagnosis not present

## 2016-04-04 DIAGNOSIS — E039 Hypothyroidism, unspecified: Secondary | ICD-10-CM | POA: Diagnosis not present

## 2016-04-04 DIAGNOSIS — E782 Mixed hyperlipidemia: Secondary | ICD-10-CM | POA: Diagnosis not present

## 2016-04-04 DIAGNOSIS — Z0000 Encounter for general adult medical examination without abnormal findings: Secondary | ICD-10-CM | POA: Diagnosis not present

## 2016-04-04 DIAGNOSIS — I119 Hypertensive heart disease without heart failure: Secondary | ICD-10-CM | POA: Diagnosis not present

## 2016-04-04 DIAGNOSIS — M8580 Other specified disorders of bone density and structure, unspecified site: Secondary | ICD-10-CM | POA: Diagnosis not present

## 2016-05-17 DIAGNOSIS — Z87442 Personal history of urinary calculi: Secondary | ICD-10-CM | POA: Diagnosis not present

## 2016-05-21 DIAGNOSIS — Z1231 Encounter for screening mammogram for malignant neoplasm of breast: Secondary | ICD-10-CM | POA: Diagnosis not present

## 2016-07-12 DIAGNOSIS — E7800 Pure hypercholesterolemia, unspecified: Secondary | ICD-10-CM | POA: Diagnosis not present

## 2016-08-08 DIAGNOSIS — Z23 Encounter for immunization: Secondary | ICD-10-CM | POA: Diagnosis not present

## 2016-08-08 DIAGNOSIS — M79672 Pain in left foot: Secondary | ICD-10-CM | POA: Diagnosis not present

## 2016-08-08 DIAGNOSIS — M8580 Other specified disorders of bone density and structure, unspecified site: Secondary | ICD-10-CM | POA: Diagnosis not present

## 2016-08-30 DIAGNOSIS — M8588 Other specified disorders of bone density and structure, other site: Secondary | ICD-10-CM | POA: Diagnosis not present

## 2016-09-11 DIAGNOSIS — H524 Presbyopia: Secondary | ICD-10-CM | POA: Diagnosis not present

## 2016-09-11 DIAGNOSIS — H25813 Combined forms of age-related cataract, bilateral: Secondary | ICD-10-CM | POA: Diagnosis not present

## 2016-09-25 ENCOUNTER — Telehealth (INDEPENDENT_AMBULATORY_CARE_PROVIDER_SITE_OTHER): Payer: Self-pay | Admitting: Radiology

## 2016-09-25 DIAGNOSIS — G8918 Other acute postprocedural pain: Secondary | ICD-10-CM | POA: Diagnosis not present

## 2016-09-25 DIAGNOSIS — M2042 Other hammer toe(s) (acquired), left foot: Secondary | ICD-10-CM | POA: Diagnosis not present

## 2016-09-25 DIAGNOSIS — M205X2 Other deformities of toe(s) (acquired), left foot: Secondary | ICD-10-CM | POA: Diagnosis not present

## 2016-09-25 NOTE — Telephone Encounter (Signed)
Per MD he said to have pt to come in for dressing change only not sched appt for her.

## 2016-09-28 NOTE — Telephone Encounter (Signed)
Patient came in, had dressing change only, Dr. Sharol Given looked at incision and pin, advised that it looked fine and pt was redressed with dry dressing.

## 2016-10-02 ENCOUNTER — Ambulatory Visit (INDEPENDENT_AMBULATORY_CARE_PROVIDER_SITE_OTHER): Payer: Commercial Managed Care - HMO | Admitting: Family

## 2016-10-02 ENCOUNTER — Encounter (INDEPENDENT_AMBULATORY_CARE_PROVIDER_SITE_OTHER): Payer: Self-pay | Admitting: Orthopedic Surgery

## 2016-10-02 VITALS — Ht 62.0 in | Wt 126.0 lb

## 2016-10-02 DIAGNOSIS — M205X2 Other deformities of toe(s) (acquired), left foot: Secondary | ICD-10-CM

## 2016-10-02 MED ORDER — DOXYCYCLINE MONOHYDRATE 100 MG PO TABS: 100.0000 mg | ORAL_TABLET | Freq: Two times a day (BID) | ORAL | 0 refills | Status: DC

## 2016-10-02 NOTE — Progress Notes (Signed)
Office Visit Note   Patient: Erica [name]           Date of Birth: [birth year]           MRN: [id number] Visit Date: 10/02/2016              Requested by: Mayra Neer, MD [address] [place], [place] [address] PCP: Mayra Neer, MD   Assessment & Plan: Visit Diagnoses:  1. Claw toe, acquired, left     Plan: Pin was removed. Have called in a prescription for doxycycline which she'll begin taking. We'll follow up in 1 week for suture removal.  Follow-Up Instructions: Return in about 1 week (around 10/09/2016).   Orders:  No orders of the defined types were placed in this encounter.  Meds ordered this encounter  Medications  . doxycycline (ADOXA) 100 MG tablet    Sig: Take 1 tablet (100 mg total) by mouth 2 (two) times daily.    Dispense:  28 tablet    Refill:  0      Procedures: No procedures performed   Clinical Data: No additional findings.   Subjective: Chief Complaint  Patient presents with  . Left Foot - Routine Post Op    Left weil osteotomy 2nd and 3rd MT plantar plate repair 2nd toe and pin 2nd toe    Patient is 1 week out from surgery s/p left weil osteotomy 2nd and 3rd MT plantar pate repair 2nd toe and pin 2nd toe. Pt states that she has had several fall and hit the pin several times that it has " bent" and has had small amount bloody drainage.  She has been non weight bearing with a knee scooter and post op shoe. Patient states that she has been keeping elevated at home higher than her heart and using ice. The stitches are intact and the pin is protruding out from the toe farther than before. She does have some swelling and some redness to incision. She denies pain for the past few days and has not needed to take any pain medication.     Review of Systems  Constitutional: Negative for chills and fever.  All other systems reviewed and are negative.    Objective: Vital Signs: Ht 5\' 2"  (1.575 m)   Wt 126 lb (57.2 kg)   BMI  23.05 kg/m   Physical Exam  Ortho Exam  Minimal swelling. Incisions well approximated little dried blood. No bleeding. No drainage. There is some surrounding erythema and tenderness. No odor or ascending cellulitis.   Specialty Comments:  No specialty comments available.  Imaging: No results found.   PMFS History: Patient Active Problem List   Diagnosis Date Noted  . Right flank pain   . AKI (acute kidney injury) ([place]) 10/18/2015  . Obstructive uropathy 10/18/2015  . UTI (urinary tract infection) 10/18/2015  . Abdominal bruit 10/18/2015  . Diastolic dysfunction [id number]  . Gram negative sepsis ([place]) 10/18/2015  . Carotid bruit 10/05/2011  . PLEURAL EFFUSION 02/23/2010  . SHORTNESS OF BREATH 02/23/2010  . CHEST PAIN-UNSPECIFIED 02/21/2010  . HYPERLIPIDEMIA TYPE I / IV 03/15/2009  . Essential hypertension 03/15/2009  . CAD, NATIVE VESSEL 03/15/2009   Past Medical History:  Diagnosis Date  . Anxiety   . Coronary artery disease    status post multiple percutaneous interventions  . Hyperlipidemia   . Hypertension   . Hypothyroidism   . Kidney calculus 2016  . Osteoarthritis     Family History  Problem Relation  Age of Onset  . Other Mother 51    accident  . Other      Patient was adopted and has no information about her siblings    Past Surgical History:  Procedure Laterality Date  . ANKLE SURGERY    . CARDIAC CATHETERIZATION  12-2008  . CORONARY ARTERY BYPASS GRAFT  01-25-10   x4  . CORONARY STENT PLACEMENT  2006    right coronary artery with drug-eluting stent  . [place]    bare-metal stent to the right coronary artery  . CORONARY STENT PLACEMENT  2008   drug-eluting stent placed in the left circumflex  . CYSTOSCOPY WITH RETROGRADE PYELOGRAM, URETEROSCOPY AND STENT PLACEMENT Right 10/18/2015   Procedure: CYSTOSCOPY WITH RETROGRADE PYELOGRAM,  AND STENT PLACEMENT;  Surgeon: Festus Aloe, MD;  Location: [facility];  Service: Urology;   Laterality: Right;  . CYSTOSCOPY/URETEROSCOPY/HOLMIUM LASER/STENT PLACEMENT Right 11/29/2015   Procedure: RIGHT URETEROSCOPY/RIGHT RETROGRADE PYELOGRAM/ RIGHT STENT REPLACEMENT;  Surgeon: Festus Aloe, MD;  Location: [facility];  Service: Urology;  Laterality: Right;   Social History   Occupational History  . office manager    Social History Main Topics  . Smoking status: Former Smoker    Quit date: 11/22/1996  . Smokeless tobacco: Not on file  . Alcohol use No  . Drug use: No  . Sexual activity: Not on file

## 2016-10-11 ENCOUNTER — Ambulatory Visit (INDEPENDENT_AMBULATORY_CARE_PROVIDER_SITE_OTHER): Payer: Commercial Managed Care - HMO | Admitting: Orthopedic Surgery

## 2016-10-11 ENCOUNTER — Encounter (INDEPENDENT_AMBULATORY_CARE_PROVIDER_SITE_OTHER): Payer: Self-pay | Admitting: Orthopedic Surgery

## 2016-10-11 VITALS — Ht 62.0 in | Wt 126.0 lb

## 2016-10-11 DIAGNOSIS — M205X2 Other deformities of toe(s) (acquired), left foot: Secondary | ICD-10-CM

## 2016-10-11 NOTE — Progress Notes (Signed)
Wound Care Note   Patient: Erica [name]           Date of Birth: 06-Dec-1940           MRN: [id number]             PCP: Mayra Neer, MD Visit Date: 10/11/2016   Assessment & Plan: Visit Diagnoses: No diagnosis found.  Plan: A begin touchdown weightbearing. We'll follow up in 2 more weeks. Keep it dry dressing over the wound after daily wound care.  Follow-Up Instructions: No Follow-up on file.  Orders:  No orders of the defined types were placed in this encounter.  No orders of the defined types were placed in this encounter.     Procedures: No notes on file   Clinical Data: No additional findings.   No images are attached to the encounter.   Subjective: Chief Complaint  Patient presents with  . Left Foot - Routine Post Op    09/25/16 left foot weil osteotomy 2nd and 3rd MT plantar plate repair 2nd toe and pin of 2nd toe    Pt is 2 weeks s/p left weil osteotomy 2nd and 3rd MT plantar plate repair 2nd toe and pin 2nd toe. Non weight bearing in a wheelchair and post op shoe. No complaints of pain, no redness and no drainage. Incision is dry and no complaints of pain. Stitches were harvested today and pt tolerated well.    Review of Systems  Constitutional: Negative for chills and fever.  All other systems reviewed and are negative.      Objective: Vital Signs: Ht 5\' 2"  (1.575 m)   Wt 126 lb (57.2 kg)   BMI 23.05 kg/m   Physical Exam: Him incision well healed. Sutures were harvested today. There is moderate surrounding erythema. Little ecchymosis. No drainage or sign of infection.  Specialty Comments: No specialty comments available.   PMFS History: Patient Active Problem List   Diagnosis Date Noted  . Right flank pain   . AKI (acute kidney injury) ([place]) 10/18/2015  . Obstructive uropathy 10/18/2015  . UTI (urinary tract infection) 10/18/2015  . Abdominal bruit 10/18/2015  . Diastolic dysfunction [id number]  . Gram negative sepsis ([place])  10/18/2015  . Carotid bruit 10/05/2011  . PLEURAL EFFUSION 02/23/2010  . SHORTNESS OF BREATH 02/23/2010  . CHEST PAIN-UNSPECIFIED 02/21/2010  . HYPERLIPIDEMIA TYPE I / IV 03/15/2009  . Essential hypertension 03/15/2009  . CAD, NATIVE VESSEL 03/15/2009   Past Medical History:  Diagnosis Date  . Anxiety   . Coronary artery disease    status post multiple percutaneous interventions  . Hyperlipidemia   . Hypertension   . Hypothyroidism   . Kidney calculus 2016  . Osteoarthritis     Family History  Problem Relation Age of Onset  . Other Mother 19    accident  . Other      Patient was adopted and has no information about her siblings   Past Surgical History:  Procedure Laterality Date  . ANKLE SURGERY    . CARDIAC CATHETERIZATION  12-2008  . CORONARY ARTERY BYPASS GRAFT  01-25-10   x4  . CORONARY STENT PLACEMENT  2006    right coronary artery with drug-eluting stent  . [place]    bare-metal stent to the right coronary artery  . CORONARY STENT PLACEMENT  2008   drug-eluting stent placed in the left circumflex  . CYSTOSCOPY WITH RETROGRADE PYELOGRAM, URETEROSCOPY AND STENT PLACEMENT Right 10/18/2015   Procedure: CYSTOSCOPY  WITH RETROGRADE PYELOGRAM,  AND STENT PLACEMENT;  Surgeon: Festus Aloe, MD;  Location: [facility];  Service: Urology;  Laterality: Right;  . CYSTOSCOPY/URETEROSCOPY/HOLMIUM LASER/STENT PLACEMENT Right 11/29/2015   Procedure: RIGHT URETEROSCOPY/RIGHT RETROGRADE PYELOGRAM/ RIGHT STENT REPLACEMENT;  Surgeon: Festus Aloe, MD;  Location: [facility];  Service: Urology;  Laterality: Right;   Social History   Occupational History  . office manager    Social History Main Topics  . Smoking status: Former Smoker    Quit date: 11/22/1996  . Smokeless tobacco: Not on file  . Alcohol use No  . Drug use: No  . Sexual activity: Not on file

## 2016-10-12 NOTE — Addendum Note (Signed)
Addended by: Dondra Prader R on: 10/12/2016 08:24 AM   Modules accepted: Orders

## 2016-10-17 ENCOUNTER — Encounter: Payer: Self-pay | Admitting: Cardiovascular Disease

## 2016-10-17 DIAGNOSIS — I119 Hypertensive heart disease without heart failure: Secondary | ICD-10-CM | POA: Diagnosis not present

## 2016-10-17 DIAGNOSIS — I2510 Atherosclerotic heart disease of native coronary artery without angina pectoris: Secondary | ICD-10-CM | POA: Diagnosis not present

## 2016-10-17 DIAGNOSIS — E782 Mixed hyperlipidemia: Secondary | ICD-10-CM | POA: Diagnosis not present

## 2016-10-17 DIAGNOSIS — R7301 Impaired fasting glucose: Secondary | ICD-10-CM | POA: Diagnosis not present

## 2016-10-17 DIAGNOSIS — M8580 Other specified disorders of bone density and structure, unspecified site: Secondary | ICD-10-CM | POA: Diagnosis not present

## 2016-10-17 DIAGNOSIS — E039 Hypothyroidism, unspecified: Secondary | ICD-10-CM | POA: Diagnosis not present

## 2016-10-24 ENCOUNTER — Ambulatory Visit (INDEPENDENT_AMBULATORY_CARE_PROVIDER_SITE_OTHER): Payer: Commercial Managed Care - HMO | Admitting: Orthopedic Surgery

## 2016-10-24 ENCOUNTER — Encounter (INDEPENDENT_AMBULATORY_CARE_PROVIDER_SITE_OTHER): Payer: Self-pay | Admitting: Orthopedic Surgery

## 2016-10-24 VITALS — Ht 62.0 in | Wt 126.0 lb

## 2016-10-24 DIAGNOSIS — M205X2 Other deformities of toe(s) (acquired), left foot: Secondary | ICD-10-CM

## 2016-10-24 NOTE — Progress Notes (Signed)
Office Visit Note   Patient: Erica [name]           Date of Birth: November 16, 1941           MRN: [id number] Visit Date: 10/24/2016              Requested by: Mayra Neer, MD [address] [place], [place] [address] PCP: Mayra Neer, MD   Assessment & Plan: Visit Diagnoses:  1. Claw toe, acquired, left    Status post Weil osteotomy of the second and third metatarsals left foot plantar plate repair second toe and pinning of the second toe. Plan: Recommended continue compression either a sore compression stocking. Continue scar massage start with heel cord stretching advanced to shoe wear as she feels comfortable.  Follow-Up Instructions: Return in about 3 weeks (around 11/14/2016).   Orders:  No orders of the defined types were placed in this encounter.  No orders of the defined types were placed in this encounter.     Procedures: No procedures performed   Clinical Data: No additional findings.   Subjective: Chief Complaint  Patient presents with  . Left Foot - Pain    Patient returns s/p weil osteotomy 2nd toe and 3rd MT plate repair 2nd toe and pin 2nd toe. Doing ok, but still feels like knot on bottom of foot. States still having some swelling mainly in the ankle. Wearing ace bandage for the swelling and ambulates in post op shoe.     Review of Systems   Objective: Vital Signs: Ht 5\' 2"  (1.575 m)   Wt 126 lb (57.2 kg)   BMI 23.05 kg/m   Physical Exam examination the incisions of healed nicely she does have some heel cord tightness she will continue with scar massage there is no redness no cellulitis no signs of infection.  Ortho Exam  Specialty Comments:  No specialty comments available.  Imaging: No results found.   PMFS History: Patient Active Problem List   Diagnosis Date Noted  . Claw toe, acquired, left 10/24/2016  . Right flank pain   . AKI (acute kidney injury) ([place]) 10/18/2015  . Obstructive uropathy 10/18/2015  .  UTI (urinary tract infection) 10/18/2015  . Abdominal bruit 10/18/2015  . Diastolic dysfunction [id number]  . Gram negative sepsis ([place]) 10/18/2015  . Carotid bruit 10/05/2011  . PLEURAL EFFUSION 02/23/2010  . SHORTNESS OF BREATH 02/23/2010  . CHEST PAIN-UNSPECIFIED 02/21/2010  . HYPERLIPIDEMIA TYPE I / IV 03/15/2009  . Essential hypertension 03/15/2009  . CAD, NATIVE VESSEL 03/15/2009   Past Medical History:  Diagnosis Date  . Anxiety   . Coronary artery disease    status post multiple percutaneous interventions  . Hyperlipidemia   . Hypertension   . Hypothyroidism   . Kidney calculus 2016  . Osteoarthritis     Family History  Problem Relation Age of Onset  . Other Mother 60    accident  . Other      Patient was adopted and has no information about her siblings    Past Surgical History:  Procedure Laterality Date  . ANKLE SURGERY    . CARDIAC CATHETERIZATION  12-2008  . CORONARY ARTERY BYPASS GRAFT  01-25-10   x4  . CORONARY STENT PLACEMENT  2006    right coronary artery with drug-eluting stent  . [place]    bare-metal stent to the right coronary artery  . CORONARY STENT PLACEMENT  2008   drug-eluting stent placed in the left  circumflex  . CYSTOSCOPY WITH RETROGRADE PYELOGRAM, URETEROSCOPY AND STENT PLACEMENT Right 10/18/2015   Procedure: CYSTOSCOPY WITH RETROGRADE PYELOGRAM,  AND STENT PLACEMENT;  Surgeon: Festus Aloe, MD;  Location: [facility];  Service: Urology;  Laterality: Right;  . CYSTOSCOPY/URETEROSCOPY/HOLMIUM LASER/STENT PLACEMENT Right 11/29/2015   Procedure: RIGHT URETEROSCOPY/RIGHT RETROGRADE PYELOGRAM/ RIGHT STENT REPLACEMENT;  Surgeon: Festus Aloe, MD;  Location: [facility];  Service: Urology;  Laterality: Right;   Social History   Occupational History  . office manager    Social History Main Topics  . Smoking status: Former Smoker    Quit date: 11/22/1996  . Smokeless tobacco: Not on file  . Alcohol  use No  . Drug use: No  . Sexual activity: Not on file

## 2016-10-30 ENCOUNTER — Encounter: Payer: Self-pay | Admitting: Cardiovascular Disease

## 2016-10-30 NOTE — Telephone Encounter (Signed)
This encounter was created in error - please disregard.

## 2016-10-30 NOTE — Telephone Encounter (Signed)
New Message  Pt call requesting to speak with RN about scheduling an appt with only Dr. Burt Knack. Please call back to discuss

## 2016-11-12 ENCOUNTER — Encounter: Payer: Self-pay | Admitting: Cardiovascular Disease

## 2016-11-12 ENCOUNTER — Ambulatory Visit (INDEPENDENT_AMBULATORY_CARE_PROVIDER_SITE_OTHER): Payer: Commercial Managed Care - HMO | Admitting: Cardiovascular Disease

## 2016-11-12 VITALS — BP 118/62 | HR 52 | Ht 62.0 in | Wt 129.0 lb

## 2016-11-12 DIAGNOSIS — I6523 Occlusion and stenosis of bilateral carotid arteries: Secondary | ICD-10-CM

## 2016-11-12 DIAGNOSIS — I25119 Atherosclerotic heart disease of native coronary artery with unspecified angina pectoris: Secondary | ICD-10-CM | POA: Diagnosis not present

## 2016-11-12 NOTE — Patient Instructions (Signed)
Medication Instructions:  Your physician recommends that you continue on your current medications as directed. Please refer to the Current Medication list given to you today.  Labwork: No new orders.   Testing/Procedures: Your physician has requested that you have a lexiscan myoview. For further information please visit [email or web address]. Please follow instruction sheet, as given.  Your physician has requested that you have a carotid duplex in January. This test is an ultrasound of the carotid arteries in your neck. It looks at blood flow through these arteries that supply the brain with blood. Allow one hour for this exam. There are no restrictions or special instructions.  Follow-Up: Your physician wants you to follow-up in: 1 YEAR with Dr Burt Knack.  You will receive a reminder letter in the mail two months in advance. If you don't receive a letter, please call our office to schedule the follow-up appointment.   Any Other Special Instructions Will Be Listed Below (If Applicable).     If you need a refill on your cardiac medications before your next appointment, please call your pharmacy.

## 2016-11-12 NOTE — Progress Notes (Signed)
Cardiology Office Note Date:  11/12/2016   ID:  Erica [name], DOB [birth year], MRN [id number]  PCP:  Mayra Neer, MD  Cardiologist:  [name], MD    Chief Complaint  Patient presents with  . Coronary Artery Disease    History of Present Illness: Erica [name] is a 75 y.o. female who presents for follow-up evaluation. The patient is followed for CAD. She underwent CABG in 2011 when she was treated with the LIMA to LAD, sequential vein graft to PLA and PDA, and vein graft obtuse marginal. She also has mild carotid stenosis with 40-59% ICA stenosis on the right and less than 40% ICA stenosis on the left. Hyperlipidemia has been managed by her primary physician.Her atorvastatin was recently increased to 80 mg.  The patient underwent foot surgery about 6 weeks ago. She still in the recovery phase, but slowly improving. She was walking over 10,000 steps regularly, now down around 05-7999 steps. She has had some resting chest discomfort described as a pressure-like sensation, relieved with sublingual nitroglycerin. She has not had this associated with walking or other physical activity. Thinks it may be related to acid reflux but not sure. Also says that the symptoms were a "precursor" to her previous heart attack. No shortness of breath, orthopnea, PND, or heart palpitations. Does complain of generalized fatigue.  Past Medical History:  Diagnosis Date  . Anxiety   . Coronary artery disease    status post multiple percutaneous interventions  . Hyperlipidemia   . Hypertension   . Hypothyroidism   . Kidney calculus 2016  . Osteoarthritis     Past Surgical History:  Procedure Laterality Date  . ANKLE SURGERY    . CARDIAC CATHETERIZATION  12-2008  . CORONARY ARTERY BYPASS GRAFT  01-25-10   x4  . CORONARY STENT PLACEMENT  2006    right coronary artery with drug-eluting stent  . [place]    bare-metal stent to the right coronary artery  . CORONARY STENT  PLACEMENT  2008   drug-eluting stent placed in the left circumflex  . CYSTOSCOPY WITH RETROGRADE PYELOGRAM, URETEROSCOPY AND STENT PLACEMENT Right 10/18/2015   Procedure: CYSTOSCOPY WITH RETROGRADE PYELOGRAM,  AND STENT PLACEMENT;  Surgeon: Festus Aloe, MD;  Location: [facility];  Service: Urology;  Laterality: Right;  . CYSTOSCOPY/URETEROSCOPY/HOLMIUM LASER/STENT PLACEMENT Right 11/29/2015   Procedure: RIGHT URETEROSCOPY/RIGHT RETROGRADE PYELOGRAM/ RIGHT STENT REPLACEMENT;  Surgeon: Festus Aloe, MD;  [facility];  Service: Urology;  Laterality: Right;    Current Outpatient Prescriptions  Medication Sig Dispense Refill  . aspirin 81 MG tablet Take 1 tablet (81 mg total) by mouth daily. 30 tablet 0  . atenolol (TENORMIN) 25 MG tablet Take 25 mg by mouth daily.    Marland Kitchen atorvastatin (LIPITOR) 80 MG tablet Take 80 mg by mouth daily.    . hydrochlorothiazide (HYDRODIURIL) 25 MG tablet Take 25 mg by mouth daily.    Marland Kitchen ibuprofen (ADVIL,MOTRIN) 200 MG tablet Take 400 mg by mouth every 6 (six) hours as needed for fever, headache or moderate pain.    Marland Kitchen levothyroxine (SYNTHROID, LEVOTHROID) 100 MCG tablet Take 100 mcg by mouth daily.    . nitroGLYCERIN (NITROSTAT) 0.4 MG SL tablet Place 1 tablet (0.4 mg total) under the tongue every 5 (five) minutes as needed for chest pain. 25 tablet 3  . sertraline (ZOLOFT) 50 MG tablet Take 50 mg by mouth daily.     No current facility-administered medications for this visit.  Allergies:   Patient has no known allergies.   Social History:  The patient  reports that she quit smoking about 19 years ago. She does not have any smokeless tobacco history on file. She reports that she does not drink alcohol or use drugs.   Family History:  The patient's  family history includes Other (age of onset: 73) in her mother.   ROS:  Please see the history of present illness. All other systems are reviewed and negative.   PHYSICAL EXAM: VS:  BP  118/62   Pulse (!) 52   Ht 5\' 2"  (1.575 m)   Wt 129 lb (58.5 kg)   BMI 23.59 kg/m  , BMI Body mass index is 23.59 kg/m. GEN: Well nourished, well developed, in no acute distress  HEENT: normal  Neck: no JVD, no masses. Right carotid bruit Cardiac: Bradycardic and regular without murmur or gallop                Respiratory:  clear to auscultation bilaterally, normal work of breathing GI: soft, nontender, nondistended, + BS MS: no deformity or atrophy  Ext: no pretibial edema, pedal pulses 2+= bilaterally Skin: warm and dry, no rash Neuro:  Strength and sensation are intact Psych: euthymic mood, full affect  EKG:  EKG is ordered today. The ekg ordered today shows sinus bradycardia 48 bpm,  Recent Labs: 11/29/2015: BUN 21; Creatinine, Ser 0.80; Hemoglobin 12.6; Potassium 3.5; Sodium 142   Lipid Panel     Component Value Date/Time   CHOL  01/09/2010 0506    131        ATP III CLASSIFICATION:  <200     mg/dL   Desirable  200-239  mg/dL   Borderline High  >=240    mg/dL   High          TRIG 87 01/09/2010 0506   HDL 48 01/09/2010 0506   CHOLHDL 2.7 01/09/2010 0506   VLDL 17 01/09/2010 0506   LDLCALC  01/09/2010 0506    66        Total Cholesterol/HDL:CHD Risk Coronary Heart Disease Risk Table                     Men   Women  1/2 Average Risk   3.4   3.3  Average Risk       5.0   4.4  2 X Average Risk   9.6   7.1  3 X Average Risk  23.4   11.0        Use the calculated Patient Ratio above and the CHD Risk Table to determine the patient's CHD Risk.        ATP III CLASSIFICATION (LDL):  <100     mg/dL   Optimal  100-129  mg/dL   Near or Above                    Optimal  130-159  mg/dL   Borderline  160-189  mg/dL   High  >190     mg/dL   Very High      Wt Readings from Last 3 Encounters:  11/12/16 129 lb (58.5 kg)  10/24/16 126 lb (57.2 kg)  10/11/16 126 lb (57.2 kg)     Cardiac Studies Reviewed: Echocardiogram October 30, 2015: Study Conclusions  - Left  ventricle: The cavity size was normal. Wall thickness was increased in a pattern of mild LVH. Systolic function was vigorous. The estimated ejection fraction was in the  range of 60% to 65%. Wall motion was normal; there were no regional wall motion abnormalities. Features are consistent with a pseudonormal left ventricular filling pattern, with concomitant abnormal relaxation and increased filling pressure (grade 2 diastolic dysfunction). - Mitral valve: There was mild regurgitation. - Left atrium: The atrium was moderately to severely dilated. - Right ventricle: The cavity size was normal. Wall thickness was normal. Systolic function was mildly reduced. - Right atrium: The atrium was mildly dilated. Central venous pressure (est): 8 mm Hg. - Atrial septum: No defect or patent foramen ovale was identified. - Tricuspid valve: There was mild-moderate regurgitation. - Pulmonary arteries: Systolic pressure was increased. PA peak pressure: 49 mm Hg (S). - Inferior vena cava: The vessel was dilated. The respirophasic diameter changes were in the normal range (>= 50%).  Carotid duplex 12/08/2015: 40-59% bilateral ICA stenosis  Myocardial perfusion scan 12/09/2014: Impression Exercise Capacity: Lexiscan with no exercise. BP Response: Normal blood pressure response. Clinical Symptoms: Mild shortness of breath noted ECG Impression: No significant ST segment change suggestive of ischemia. Comparison with Prior Nuclear Study: No images to compare  Overall Impression: Low risk stress nuclear study with no evidence of ischemia identified..  LV Ejection Fraction: 60%. LV Wall Motion: Septal wall hypokinesis consistent with post bypass changes. Otherwise, normal left ventricular contractile performance.   ASSESSMENT AND PLAN: 1.  CAD, native vessel, with angina: Patient with resting chest discomfort, some features are typical and others atypical of cardiac  angina. Possible that her symptoms are related to gastroesophageal reflux disease with a component of esophageal spasm which could cause nitroglycerin responsive chest pain. Recommend a Lexiscan stress Myoview for further risk stratification and ischemic evaluation. Current medications will be continued.  2. Essential hypertension: Blood pressure well controlled on current medications.  3. Hyperlipidemia: Treated with atorvastatin which was recently increased 80 mg. Lipids are followed by Dr. Brigitte Pulse.  4. Carotid stenosis without history of stroke: Last carotid Doppler reviewed. Will reassess at one year intervals.  Current medicines are reviewed with the patient today.  The patient does not have concerns regarding medicines.  Labs/ tests ordered today include:  No orders of the defined types were placed in this encounter.  Disposition:   FU one year unless Stress Myoview shows significant ischemia  Signed, [name], MD  11/12/2016 10:43 AM    [facility] [place], [place], [place]  [address] Phone: [phone]; Fax: [phone]

## 2016-11-14 ENCOUNTER — Ambulatory Visit (INDEPENDENT_AMBULATORY_CARE_PROVIDER_SITE_OTHER): Payer: Commercial Managed Care - HMO | Admitting: Family

## 2016-11-14 DIAGNOSIS — M205X2 Other deformities of toe(s) (acquired), left foot: Secondary | ICD-10-CM

## 2016-11-14 NOTE — Progress Notes (Signed)
Office Visit Note   Patient: Erica [name]           Date of Birth: 1941/10/31           MRN: [id number] Visit Date: 11/14/2016              Requested by: Mayra Neer, MD [address] [place], [place] [address] PCP: Mayra Neer, MD   Assessment & Plan: Visit Diagnoses:  1. Claw toe, acquired, left     Plan:    Office Visit Note   Patient: Erica [name]           Date of Birth: [birth year]           MRN: [id number] Visit Date: 11/14/2016              Requested by: Mayra Neer, MD [address] [place], [place] [address] PCP: Mayra Neer, MD   Assessment & Plan: Visit Diagnoses:  1. Claw toe, acquired, left     Plan: Continue scar massage start with heel cord stretching. Continue regular shoewear.  Follow-Up Instructions: Return in about 4 weeks (around 12/12/2016).   Orders:  No orders of the defined types were placed in this encounter.  No orders of the defined types were placed in this encounter.     Procedures: No procedures performed   Clinical Data: No additional findings.   Subjective: Chief Complaint  Patient presents with  . Left Foot - Routine Post Op     Objective: Vital Signs: There were no vitals taken for this visit.  Physical Exam: on examination the incision is healed nicely she does have some heel cord tightness she will continue with scar massage there is no redness no cellulitis no signs of infection.  Ortho Exam  Specialty Comments:  No specialty comments available.  Imaging: No results found.   PMFS History: Patient Active Problem List   Diagnosis Date Noted  . Claw toe, acquired, left 10/24/2016  . Right flank pain   . AKI (acute kidney injury) ([place]) 10/18/2015  . Obstructive uropathy 10/18/2015  . UTI (urinary tract infection) 10/18/2015  . Abdominal bruit 10/18/2015  . Diastolic dysfunction [id number]  . Gram negative sepsis ([place]) 10/18/2015  . Carotid bruit 10/05/2011    . PLEURAL EFFUSION 02/23/2010  . SHORTNESS OF BREATH 02/23/2010  . CHEST PAIN-UNSPECIFIED 02/21/2010  . HYPERLIPIDEMIA TYPE I / IV 03/15/2009  . Essential hypertension 03/15/2009  . CAD, NATIVE VESSEL 03/15/2009   Past Medical History:  Diagnosis Date  . Anxiety   . Coronary artery disease    status post multiple percutaneous interventions  . Hyperlipidemia   . Hypertension   . Hypothyroidism   . Kidney calculus 2016  . Osteoarthritis     Family History  Problem Relation Age of Onset  . Other Mother 82    accident  . Other      Patient was adopted and has no information about her siblings    Past Surgical History:  Procedure Laterality Date  . ANKLE SURGERY    . CARDIAC CATHETERIZATION  12-2008  . CORONARY ARTERY BYPASS GRAFT  01-25-10   x4  . CORONARY STENT PLACEMENT  2006    right coronary artery with drug-eluting stent  . [place]    bare-metal stent to the right coronary artery  . CORONARY STENT PLACEMENT  2008   drug-eluting stent placed in the left circumflex  . CYSTOSCOPY WITH RETROGRADE PYELOGRAM, URETEROSCOPY AND STENT  PLACEMENT Right 10/18/2015   Procedure: CYSTOSCOPY WITH RETROGRADE PYELOGRAM,  AND STENT PLACEMENT;  Surgeon: Festus Aloe, MD;  Location: [facility];  Service: Urology;  Laterality: Right;  . CYSTOSCOPY/URETEROSCOPY/HOLMIUM LASER/STENT PLACEMENT Right 11/29/2015   Procedure: RIGHT URETEROSCOPY/RIGHT RETROGRADE PYELOGRAM/ RIGHT STENT REPLACEMENT;  Surgeon: Festus Aloe, MD;  Location: [facility];  Service: Urology;  Laterality: Right;   Social History   Occupational History  . office manager    Social History Main Topics  . Smoking status: Former Smoker    Quit date: 11/22/1996  . Smokeless tobacco: Not on file  . Alcohol use No  . Drug use: No  . Sexual activity: Not on file         Follow-Up Instructions: Return in about 4 weeks (around 12/12/2016).   Orders:  No orders of the defined  types were placed in this encounter.  No orders of the defined types were placed in this encounter.     Procedures: No procedures performed   Clinical Data: No additional findings.   Subjective: Chief Complaint  Patient presents with  . Left Foot - Routine Post Op    Patient is a 75 year old woman who is seen Status post Weil osteotomy of the second and third metatarsals left foot plantar plate repair second toe and pinning of the second toe.    Review of Systems  Constitutional: Negative for chills and fever.     Objective: Vital Signs: There were no vitals taken for this visit.  Physical Exam  Constitutional: She is oriented to person, place, and time. She appears well-developed and well-nourished.  Pulmonary/Chest: Effort normal.  Neurological: She is alert and oriented to person, place, and time.  Psychiatric: She has a normal mood and affect.  Nursing note reviewed.   Ortho Exam  Specialty Comments:  No specialty comments available.  Imaging: No results found.   PMFS History: Patient Active Problem List   Diagnosis Date Noted  . Claw toe, acquired, left 10/24/2016  . Right flank pain   . AKI (acute kidney injury) ([place]) 10/18/2015  . Obstructive uropathy 10/18/2015  . UTI (urinary tract infection) 10/18/2015  . Abdominal bruit 10/18/2015  . Diastolic dysfunction [id number]  . Gram negative sepsis ([place]) 10/18/2015  . Carotid bruit 10/05/2011  . PLEURAL EFFUSION 02/23/2010  . SHORTNESS OF BREATH 02/23/2010  . CHEST PAIN-UNSPECIFIED 02/21/2010  . HYPERLIPIDEMIA TYPE I / IV 03/15/2009  . Essential hypertension 03/15/2009  . CAD, NATIVE VESSEL 03/15/2009   Past Medical History:  Diagnosis Date  . Anxiety   . Coronary artery disease    status post multiple percutaneous interventions  . Hyperlipidemia   . Hypertension   . Hypothyroidism   . Kidney calculus 2016  . Osteoarthritis     Family History  Problem Relation Age of Onset  . Other  Mother 75    accident  . Other      Patient was adopted and has no information about her siblings    Past Surgical History:  Procedure Laterality Date  . ANKLE SURGERY    . CARDIAC CATHETERIZATION  12-2008  . CORONARY ARTERY BYPASS GRAFT  01-25-10   x4  . CORONARY STENT PLACEMENT  2006    right coronary artery with drug-eluting stent  . [place]    bare-metal stent to the right coronary artery  . CORONARY STENT PLACEMENT  2008   drug-eluting stent placed in the left circumflex  . CYSTOSCOPY WITH RETROGRADE PYELOGRAM, URETEROSCOPY AND STENT  PLACEMENT Right 10/18/2015   Procedure: CYSTOSCOPY WITH RETROGRADE PYELOGRAM,  AND STENT PLACEMENT;  Surgeon: Festus Aloe, MD;  Location: [facility];  Service: Urology;  Laterality: Right;  . CYSTOSCOPY/URETEROSCOPY/HOLMIUM LASER/STENT PLACEMENT Right 11/29/2015   Procedure: RIGHT URETEROSCOPY/RIGHT RETROGRADE PYELOGRAM/ RIGHT STENT REPLACEMENT;  Surgeon: Festus Aloe, MD;  Location: [facility];  Service: Urology;  Laterality: Right;   Social History   Occupational History  . office manager    Social History Main Topics  . Smoking status: Former Smoker    Quit date: 11/22/1996  . Smokeless tobacco: Not on file  . Alcohol use No  . Drug use: No  . Sexual activity: Not on file

## 2016-11-29 ENCOUNTER — Telehealth (HOSPITAL_COMMUNITY): Payer: Self-pay | Admitting: *Deleted

## 2016-11-29 NOTE — Telephone Encounter (Signed)
Patient given detailed instructions per Myocardial Perfusion Study Information Sheet for the test on 12/04/16 Patient notified to arrive 15 minutes early and that it is imperative to arrive on time for appointment to keep from having the test rescheduled.  If you need to cancel or reschedule your appointment, please call the office within 24 hours of your appointment. Failure to do so may result in a cancellation of your appointment, and a $50 no show fee. Patient verbalized understanding. Kirstie Peri

## 2016-12-04 ENCOUNTER — Ambulatory Visit (HOSPITAL_COMMUNITY): Payer: Commercial Managed Care - HMO | Attending: Cardiovascular Disease

## 2016-12-04 DIAGNOSIS — I25119 Atherosclerotic heart disease of native coronary artery with unspecified angina pectoris: Secondary | ICD-10-CM | POA: Diagnosis not present

## 2016-12-04 LAB — MYOCARDIAL PERFUSION IMAGING
CHL CUP NUCLEAR SDS: 2
CHL CUP RESTING HR STRESS: 49 {beats}/min
CSEPPHR: 68 {beats}/min
LV dias vol: 90 mL (ref 46–106)
LVSYSVOL: 36 mL
RATE: 0.35
SRS: 0
SSS: 2
TID: 1.02

## 2016-12-04 MED ORDER — TECHNETIUM TC 99M TETROFOSMIN IV KIT
32.7000 | PACK | Freq: Once | INTRAVENOUS | Status: AC | PRN
  Administered 2016-12-04: 32.7 via INTRAVENOUS
  Filled 2016-12-04: qty 33

## 2016-12-04 MED ORDER — TECHNETIUM TC 99M TETROFOSMIN IV KIT
10.2000 | PACK | Freq: Once | INTRAVENOUS | Status: AC | PRN
  Administered 2016-12-04: 10.2 via INTRAVENOUS
  Filled 2016-12-04: qty 11

## 2016-12-04 MED ORDER — REGADENOSON 0.4 MG/5ML IV SOLN
0.4000 mg | Freq: Once | INTRAVENOUS | Status: AC
  Administered 2016-12-04: 0.4 mg via INTRAVENOUS

## 2016-12-07 ENCOUNTER — Ambulatory Visit (HOSPITAL_COMMUNITY)
Admission: RE | Admit: 2016-12-07 | Discharge: 2016-12-07 | Disposition: A | Discharge status: Home / Self Care | Payer: Commercial Managed Care - HMO | Source: Ambulatory Visit | Attending: Cardiovascular Disease | Admitting: Cardiovascular Disease

## 2016-12-07 DIAGNOSIS — I6523 Occlusion and stenosis of bilateral carotid arteries: Secondary | ICD-10-CM | POA: Insufficient documentation

## 2016-12-12 ENCOUNTER — Ambulatory Visit (INDEPENDENT_AMBULATORY_CARE_PROVIDER_SITE_OTHER): Payer: Commercial Managed Care - HMO | Admitting: Orthopedic Surgery

## 2016-12-13 ENCOUNTER — Ambulatory Visit (INDEPENDENT_AMBULATORY_CARE_PROVIDER_SITE_OTHER): Payer: Commercial Managed Care - HMO | Admitting: Orthopedic Surgery

## 2016-12-13 VITALS — Ht 62.0 in | Wt 129.0 lb

## 2016-12-13 DIAGNOSIS — M205X2 Other deformities of toe(s) (acquired), left foot: Secondary | ICD-10-CM

## 2016-12-13 NOTE — Progress Notes (Signed)
Office Visit Note   Patient: Erica [name]           Date of Birth: 06-17-1941           MRN: [id number] Visit Date: 12/13/2016              Requested by: Mayra Neer, MD [address] [place], [place] [address] PCP: Mayra Neer, MD  Chief Complaint  Patient presents with  . Left Foot - Follow-up    S/p Left foot weil osteotomy 2nd toe and 3rd MT plate repair 2nd toe and pin 2nd toe.    HPI: Pt is about 3 months out from a left foot weil osteotomy 2nd toe and 3rd MT plate repair 2nd toe and pin 2nd toe. She is full weightbearing in a regular shoe. Patient states that her incision "burns" for a min or tow when she sits after being upon her feet and active. Otherwise there are no questions or concerns. Pamella Pert, RMA    Assessment & Plan: Visit Diagnoses:  1. Claw toe, acquired, left     Plan: Follow-up as needed. Patient has minimal swelling recommended compression stockings but she states she doubts she can get these on. Patient does have some thickening of the scar tissue dorsally she was given instructions for scar massage and stretching of the second and third toes. She will follow up with [place] as needed to evaluate the right foot.  Follow-Up Instructions: Return if symptoms worsen or fail to improve.   Ortho Exam Examination patient's toes are straight she has no floating of the toes on the alignment is good across the forefoot. There is no redness no cellulitis she has little bit of thickening of the scar tissue.  Imaging: No results found.  Orders:  No orders of the defined types were placed in this encounter.  No orders of the defined types were placed in this encounter.    Procedures: No procedures performed  Clinical Data: No additional findings.  Subjective: Review of Systems  Objective: Vital Signs: Ht 5\' 2"  (1.575 m)   Wt 129 lb (58.5 kg)   BMI 23.59 kg/m   Specialty Comments:  No specialty comments available.  PMFS  History: Patient Active Problem List   Diagnosis Date Noted  . Claw toe, acquired, left 10/24/2016  . Right flank pain   . AKI (acute kidney injury) ([place]) 10/18/2015  . Obstructive uropathy 10/18/2015  . UTI (urinary tract infection) 10/18/2015  . Abdominal bruit 10/18/2015  . Diastolic dysfunction [id number]  . Gram negative sepsis ([place]) 10/18/2015  . Carotid bruit 10/05/2011  . PLEURAL EFFUSION 02/23/2010  . SHORTNESS OF BREATH 02/23/2010  . CHEST PAIN-UNSPECIFIED 02/21/2010  . HYPERLIPIDEMIA TYPE I / IV 03/15/2009  . Essential hypertension 03/15/2009  . CAD, NATIVE VESSEL 03/15/2009   Past Medical History:  Diagnosis Date  . Anxiety   . Coronary artery disease    status post multiple percutaneous interventions  . Hyperlipidemia   . Hypertension   . Hypothyroidism   . Kidney calculus 2016  . Osteoarthritis     Family History  Problem Relation Age of Onset  . Other Mother 77    accident  . Other      Patient was adopted and has no information about her siblings    Past Surgical History:  Procedure Laterality Date  . ANKLE SURGERY    . CARDIAC CATHETERIZATION  12-2008  . CORONARY ARTERY BYPASS GRAFT  01-25-10   x4  .  CORONARY STENT PLACEMENT  2006    right coronary artery with drug-eluting stent  . [place]    bare-metal stent to the right coronary artery  . CORONARY STENT PLACEMENT  2008   drug-eluting stent placed in the left circumflex  . CYSTOSCOPY WITH RETROGRADE PYELOGRAM, URETEROSCOPY AND STENT PLACEMENT Right 10/18/2015   Procedure: CYSTOSCOPY WITH RETROGRADE PYELOGRAM,  AND STENT PLACEMENT;  Surgeon: Festus Aloe, MD;  Location: [facility];  Service: Urology;  Laterality: Right;  . CYSTOSCOPY/URETEROSCOPY/HOLMIUM LASER/STENT PLACEMENT Right 11/29/2015   Procedure: RIGHT URETEROSCOPY/RIGHT RETROGRADE PYELOGRAM/ RIGHT STENT REPLACEMENT;  Surgeon: Festus Aloe, MD;  Location: [facility];  Service: Urology;  Laterality:  Right;   Social History   Occupational History  . office manager    Social History Main Topics  . Smoking status: Former Smoker    Quit date: 11/22/1996  . Smokeless tobacco: Not on file  . Alcohol use No  . Drug use: No  . Sexual activity: Not on file

## 2016-12-16 IMAGING — CR DG CHEST 2V
2 series · 2 of 2 positions shown · non-contrast
Comparison: 03/16/2015

CLINICAL DATA: Fever.  Posterior right chest wall pain.

EXAM:
CHEST  2 VIEW

[view not recorded (1 of 2)]
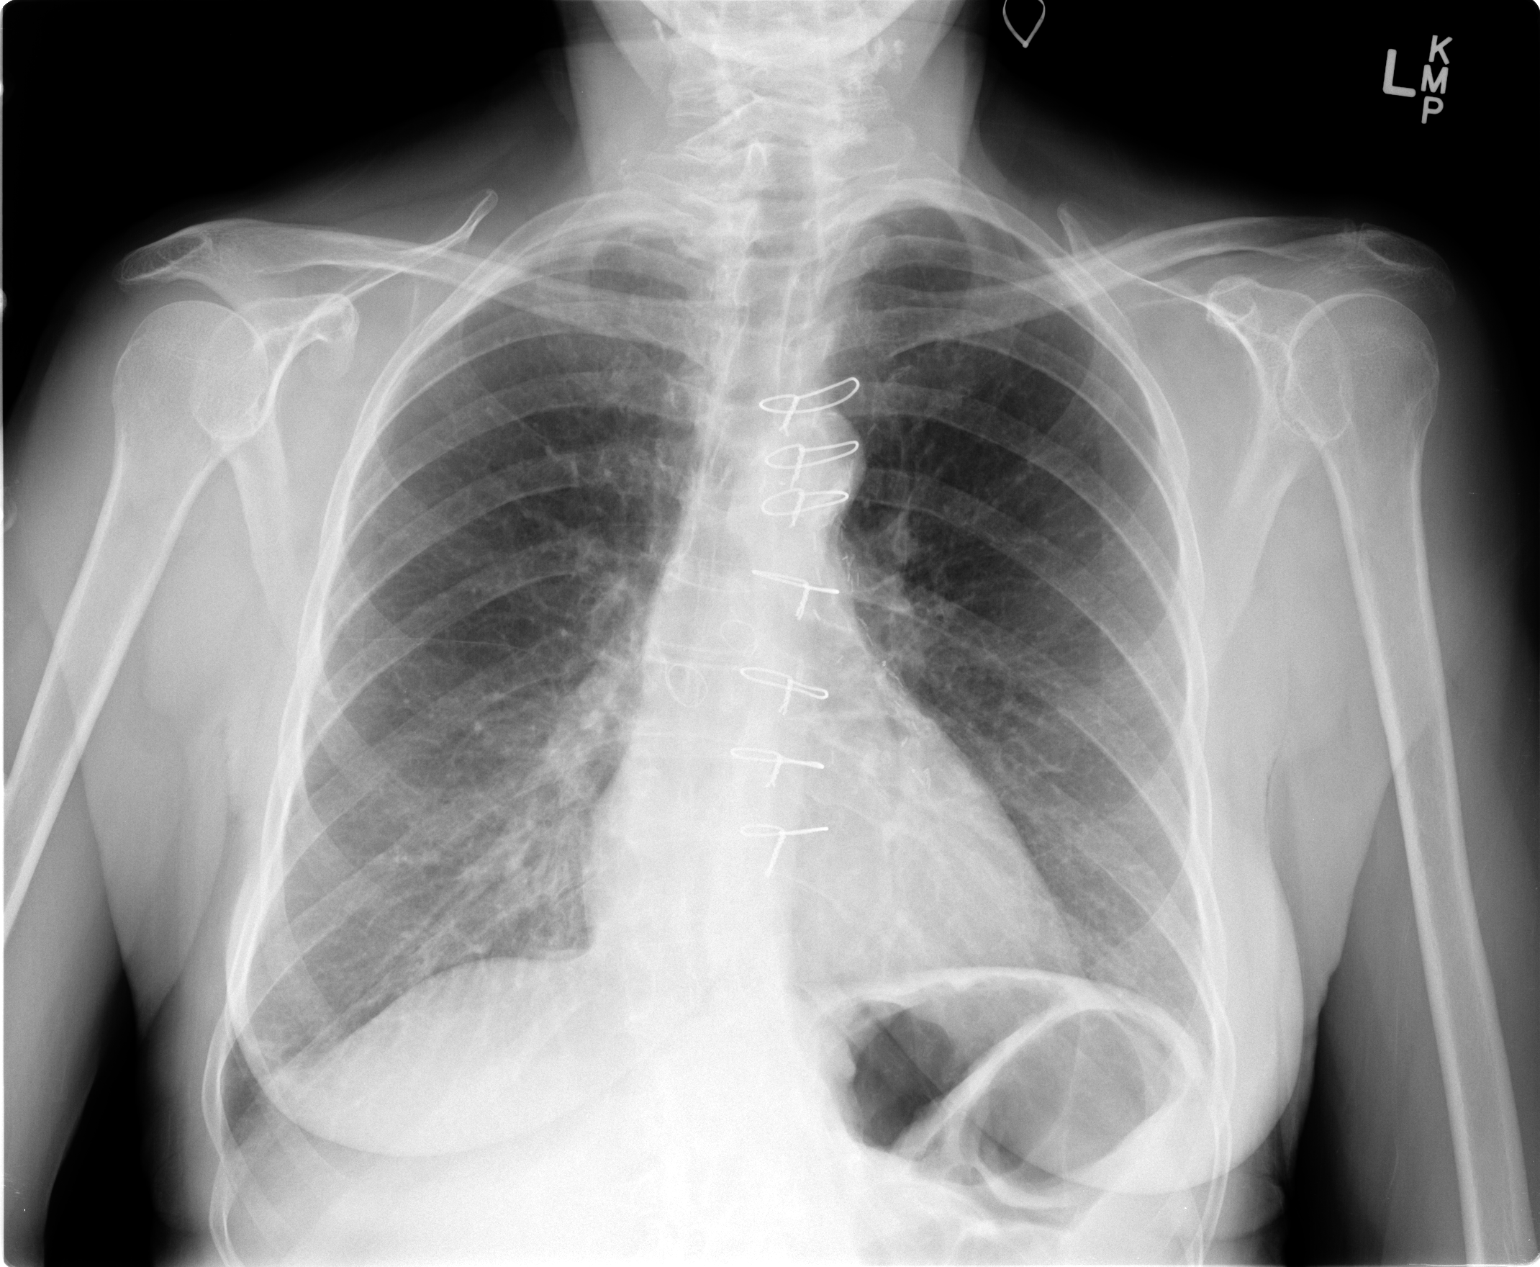

[view not recorded (2 of 2)]
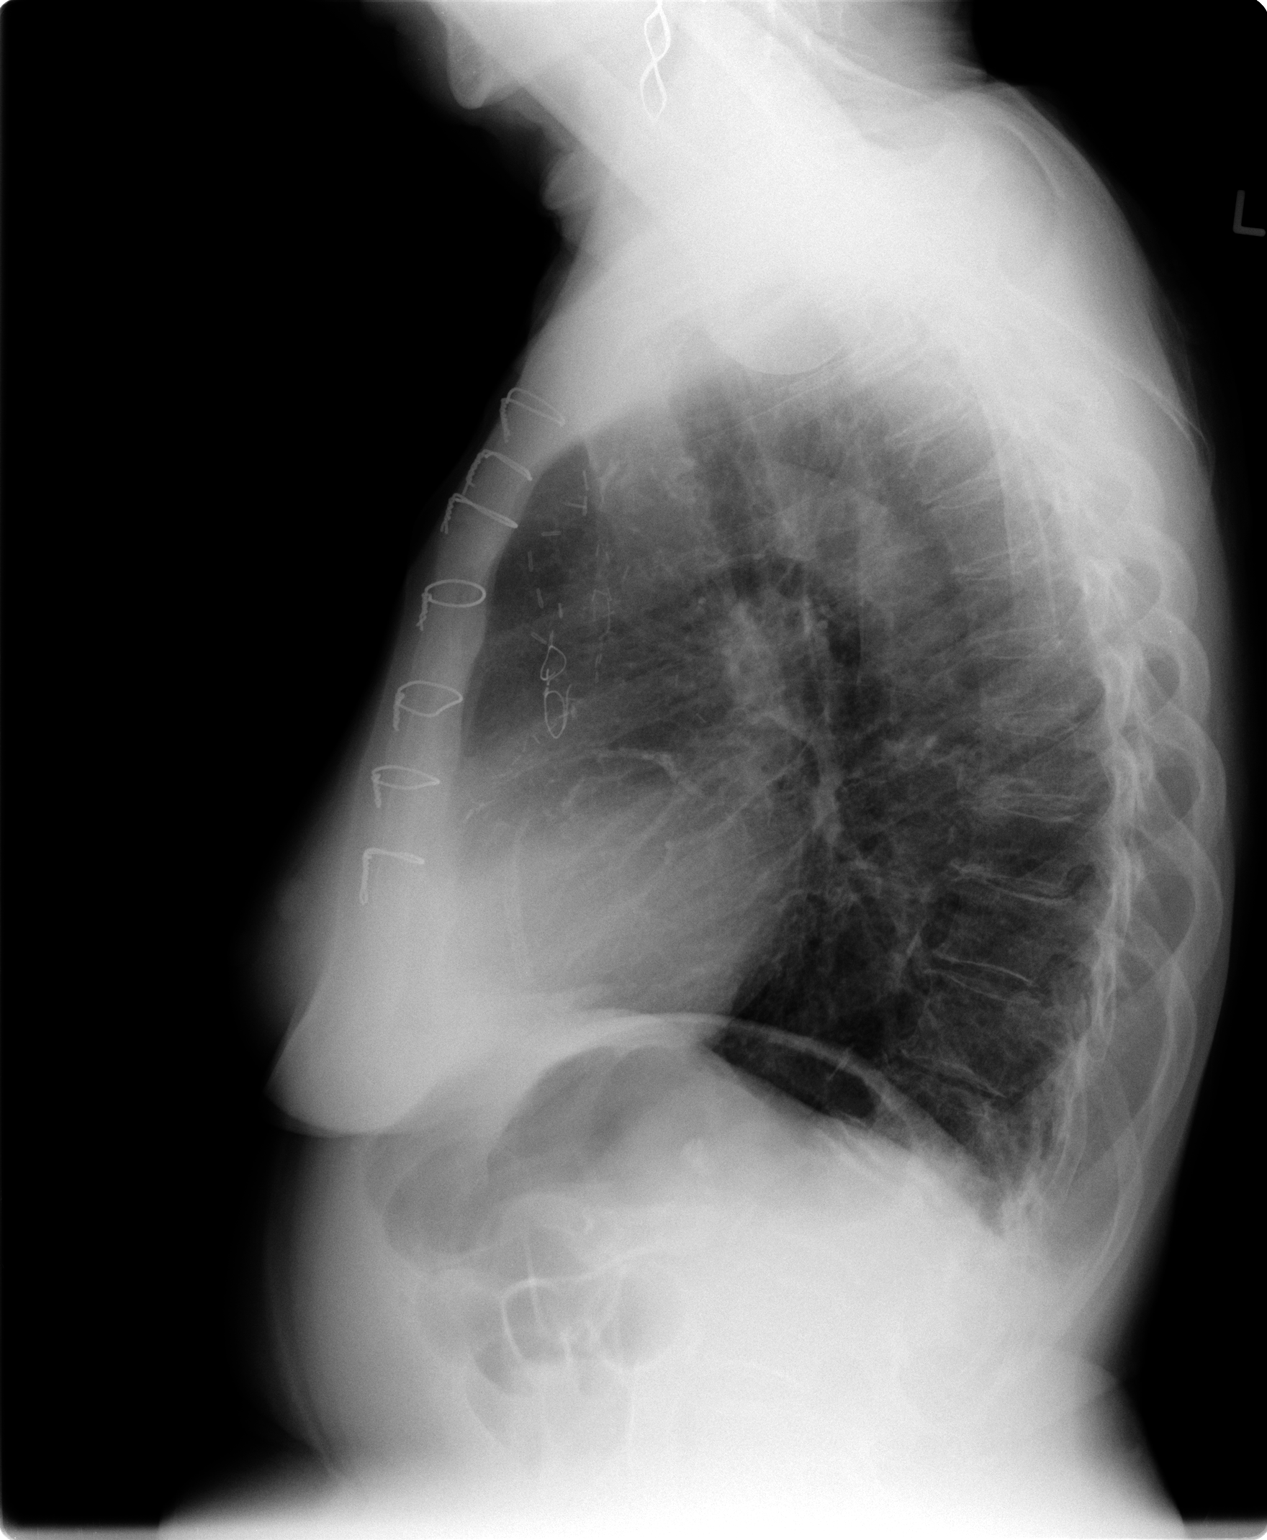

[2 of 2 positions shown; findings below may reference images not displayed]

FINDINGS: Heart size and pulmonary vascularity are normal. Prior CABG. Slight
atelectasis at the right lung base laterally. No infiltrates or
effusions. No acute osseous abnormality. Chronic thoracic scoliosis.
IMPRESSION: Minimal atelectasis at the right lung base.

## 2016-12-16 IMAGING — CT CT RENAL STONE PROTOCOL
2 of 3 series · 16 of 30 positions shown, 18 images · non-contrast
Comparison: None

CLINICAL DATA: Right flank pain.  Nausea and vomiting.

EXAM:
CT ABDOMEN AND PELVIS WITHOUT CONTRAST
TECHNIQUE: Multidetector CT imaging of the abdomen and pelvis was performed
following the standard protocol without IV contrast.

[Series 4: lung windows · axial · 0.74mm/px · z∈[-132,-68]mm · 13 of 16 slices shown, 15 images]
[im 2/16  soft-tissue]
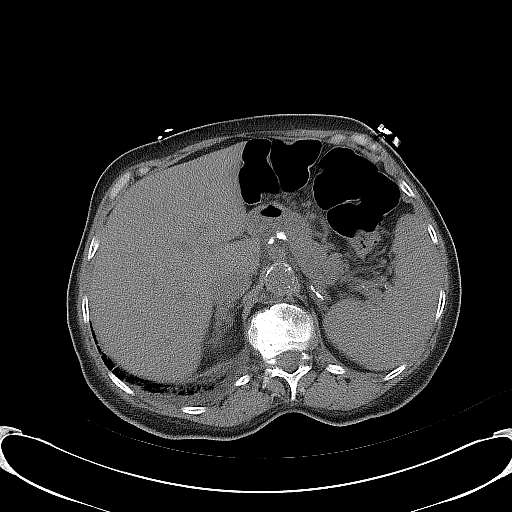
[im 2/16  bone]
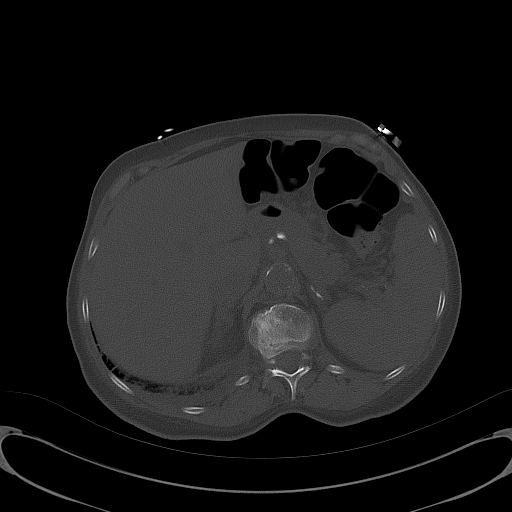
[im 3/16  soft-tissue]
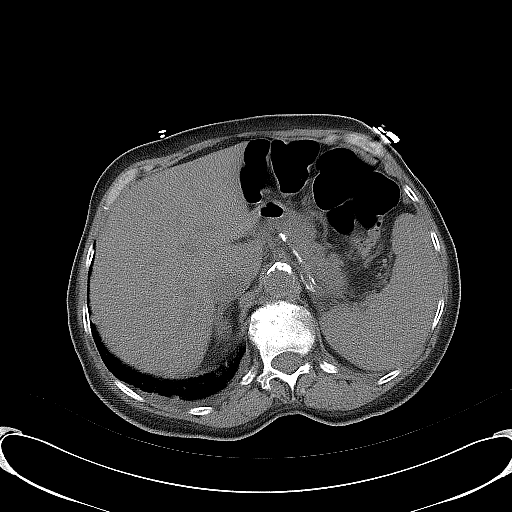
[im 4/16  soft-tissue]
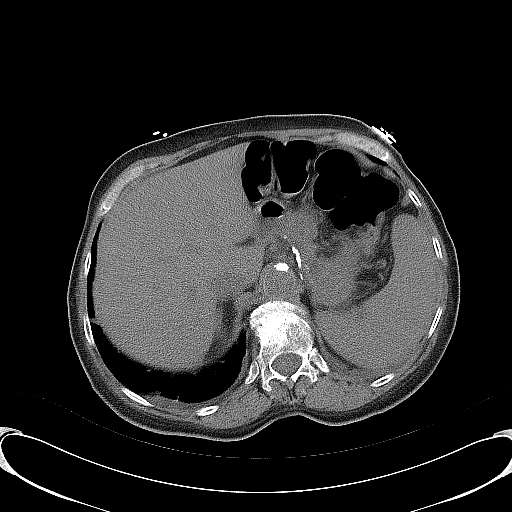
[im 5/16  soft-tissue]
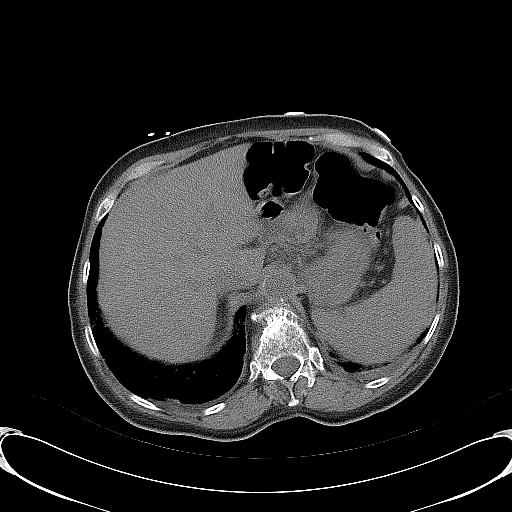
[im 6/16  soft-tissue]
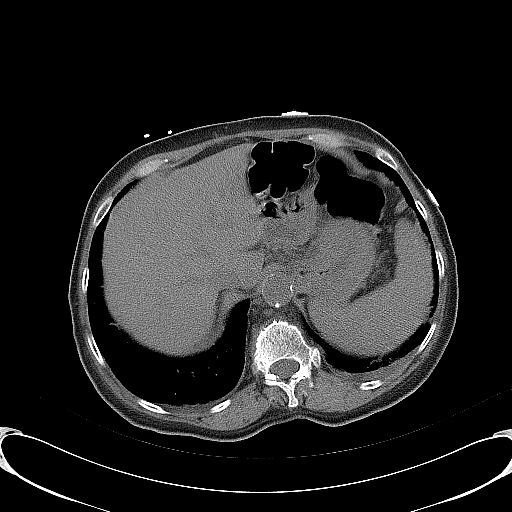
[im 7/16  soft-tissue]
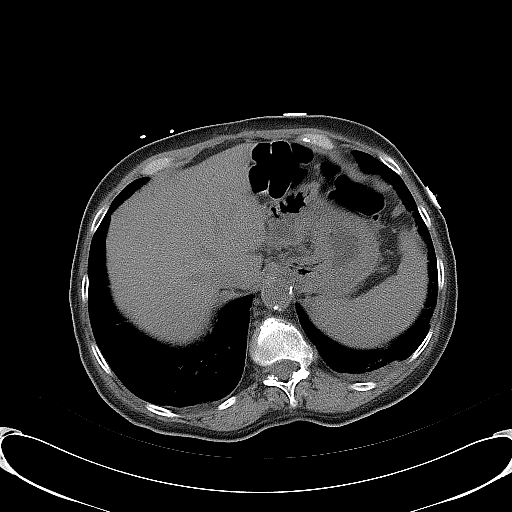
[im 9/16  soft-tissue]
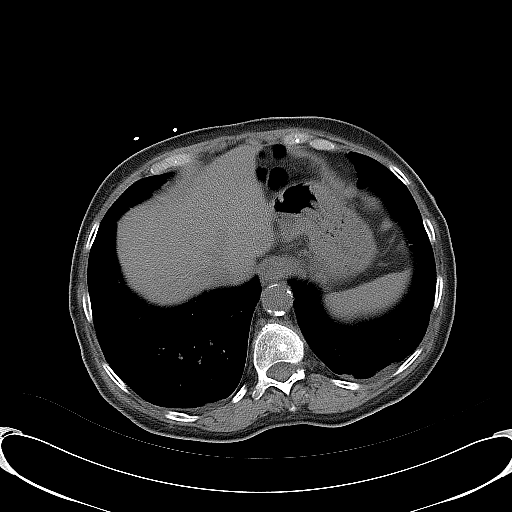
[im 10/16  soft-tissue]
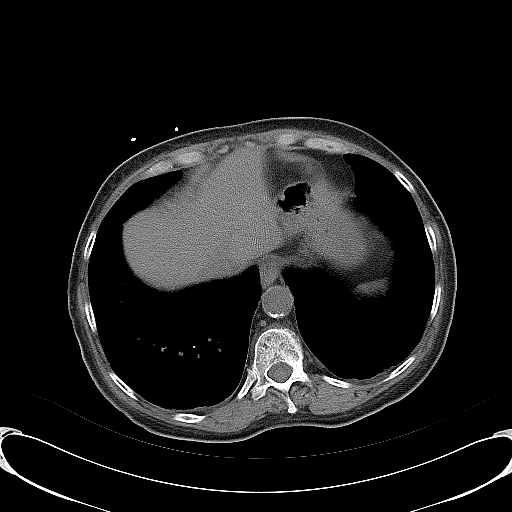
[im 11/16  soft-tissue]
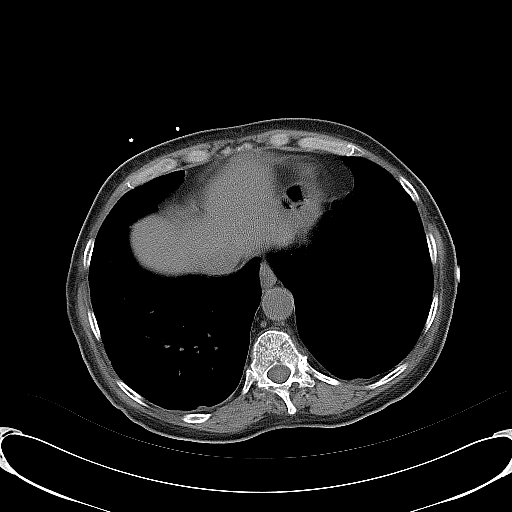
[im 11/16  bone]
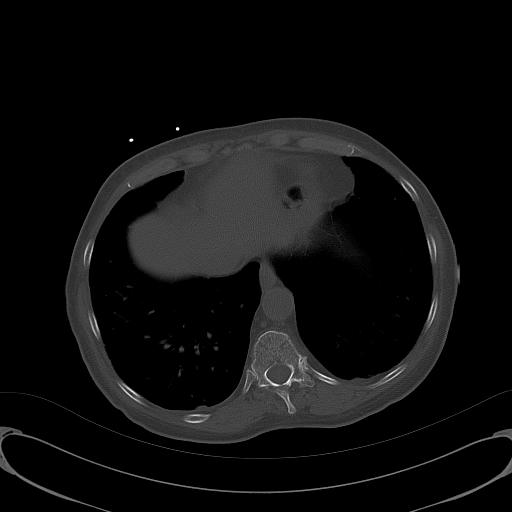
[im 12/16  soft-tissue]
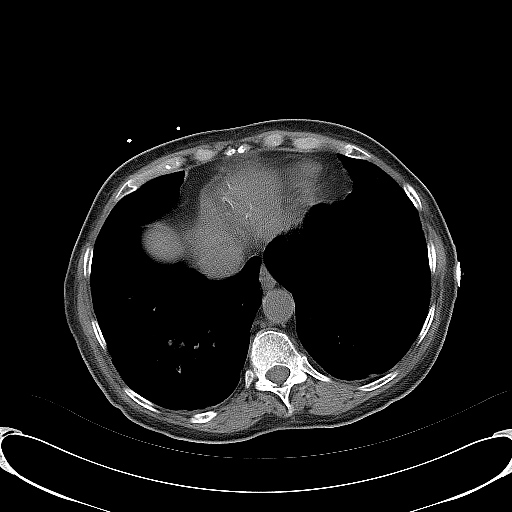
[im 13/16  soft-tissue]
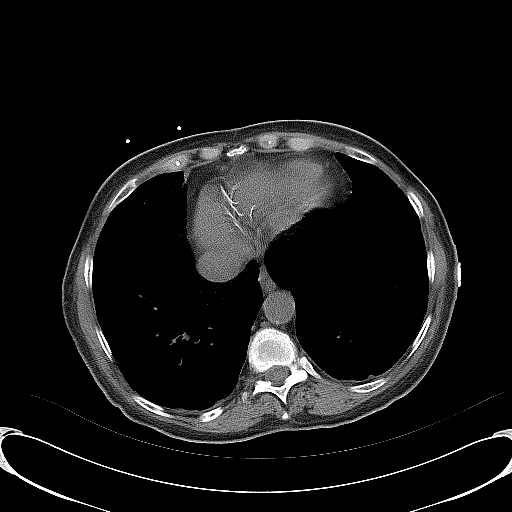
[im 14/16  soft-tissue]
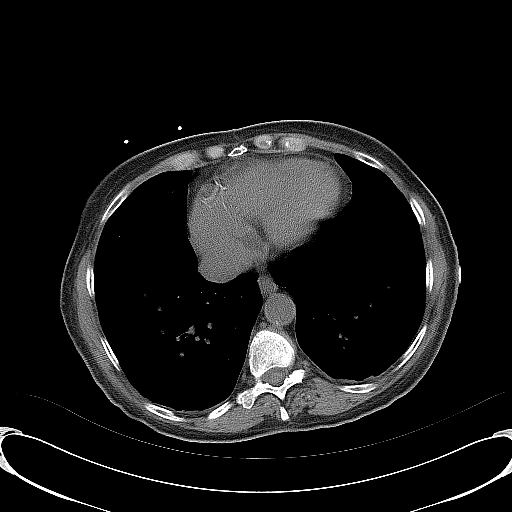
[im 15/16  soft-tissue]
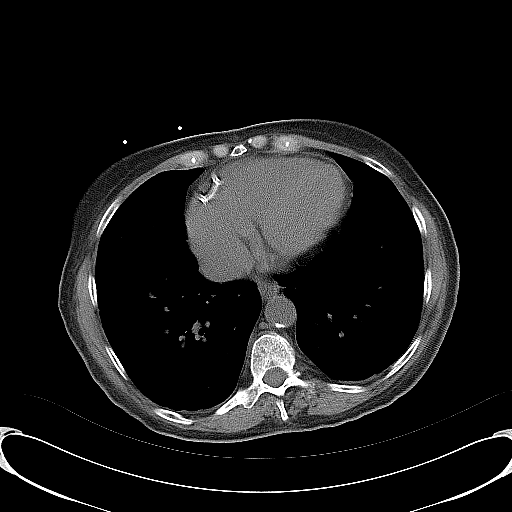

[Series 602: <mpr thick range> · coronal · 0.75mm/px · 3 of 133 slices shown]
[im 45/133  soft-tissue]
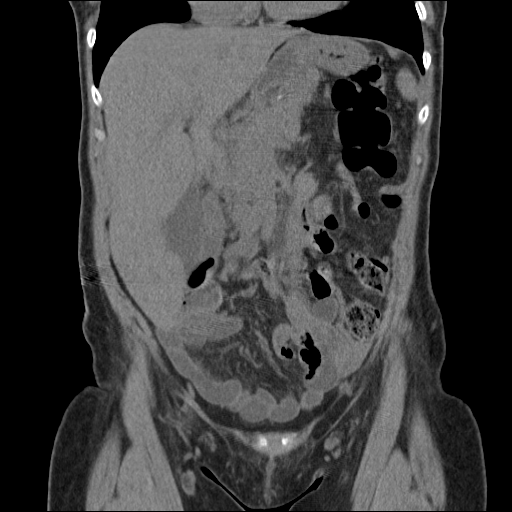
[im 59/133  soft-tissue]
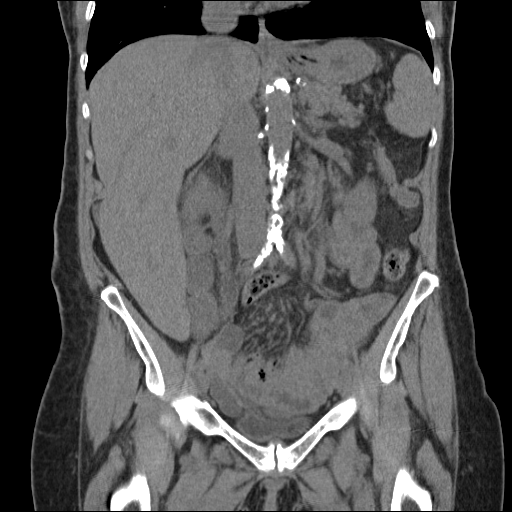
[im 74/133  soft-tissue]
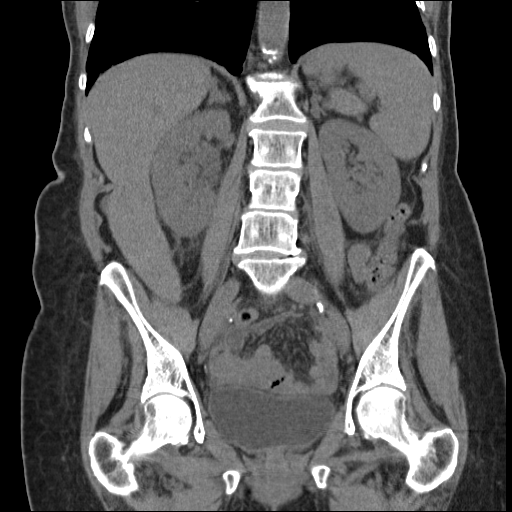

[16 of 30 positions shown; findings below may reference images not displayed]

FINDINGS: Lower chest: Small bilateral pleural effusions and mild interstitial
prominence is noted. RCA coronary artery calcification and thoracic
aortic atherosclerotic calcifications noted.

Hepatobiliary: No suspicious liver abnormality identified. The
gallbladder appears normal. No biliary dilatation.

Pancreas: Normal appearance of the pancreas.

Spleen: The spleen is negative.

Adrenals/Urinary Tract: The adrenal glands are negative. There is
right-sided nephro megaly, perinephric fat stranding and
hydronephrosis. Stone within the inferior pole of right kidney
measures 4 mm. Right-sided hydroureter and periureteral fat
stranding noted. Distal right ureteral calculus measures 5 mm. No
stone identified within the urinary bladder. Left kidney is normal.

Stomach/Bowel: The stomach is within normal limits. The small bowel
loops have a normal course and caliber. No obstruction. Normal
appearance of the colon.

Vascular/Lymphatic: Calcified atherosclerotic disease involves the
abdominal aorta. No aneurysm. No enlarged retroperitoneal or
mesenteric adenopathy. No enlarged pelvic or inguinal lymph nodes.

Reproductive: Uterus in the adnexal structures have a normal
appearance for patient's age.

Other: Small amount of free fluid identified within the dependent
portion of the pelvis.

Musculoskeletal: Degenerative disc disease noted within the lumbar
spine. This is most advanced at T12-L1. There is an anterolisthesis
of L4 on L5 noted.
IMPRESSION: 1. Right-sided hydronephrosis and hydroureter secondary to 5 mm
distal ureteral calculus.
2. Aortic atherosclerosis as well as coronary artery calcification.
3. Small amount of pleural fluid and interstitial edema noted within
the lung bases. Correlate for any clinical signs or symptoms of CHF.

## 2017-03-12 DIAGNOSIS — H25813 Combined forms of age-related cataract, bilateral: Secondary | ICD-10-CM | POA: Diagnosis not present

## 2017-03-12 DIAGNOSIS — H524 Presbyopia: Secondary | ICD-10-CM | POA: Diagnosis not present

## 2017-05-17 ENCOUNTER — Ambulatory Visit
Admission: RE | Admit: 2017-05-17 | Discharge: 2017-05-17 | Disposition: A | Discharge status: Home / Self Care | Payer: Medicare HMO | Source: Ambulatory Visit | Attending: Family Medicine | Admitting: Family Medicine

## 2017-05-17 ENCOUNTER — Other Ambulatory Visit: Payer: Self-pay | Admitting: Family Medicine

## 2017-05-17 DIAGNOSIS — M79671 Pain in right foot: Secondary | ICD-10-CM

## 2017-05-17 DIAGNOSIS — M19071 Primary osteoarthritis, right ankle and foot: Secondary | ICD-10-CM | POA: Diagnosis not present

## 2017-05-17 DIAGNOSIS — H3530 Unspecified macular degeneration: Secondary | ICD-10-CM | POA: Diagnosis not present

## 2017-05-17 DIAGNOSIS — R7301 Impaired fasting glucose: Secondary | ICD-10-CM | POA: Diagnosis not present

## 2017-05-17 DIAGNOSIS — I119 Hypertensive heart disease without heart failure: Secondary | ICD-10-CM | POA: Diagnosis not present

## 2017-05-17 DIAGNOSIS — E039 Hypothyroidism, unspecified: Secondary | ICD-10-CM | POA: Diagnosis not present

## 2017-05-17 DIAGNOSIS — M8580 Other specified disorders of bone density and structure, unspecified site: Secondary | ICD-10-CM | POA: Diagnosis not present

## 2017-05-17 DIAGNOSIS — M1990 Unspecified osteoarthritis, unspecified site: Secondary | ICD-10-CM | POA: Diagnosis not present

## 2017-05-17 DIAGNOSIS — Z0000 Encounter for general adult medical examination without abnormal findings: Secondary | ICD-10-CM | POA: Diagnosis not present

## 2017-05-17 DIAGNOSIS — I2510 Atherosclerotic heart disease of native coronary artery without angina pectoris: Secondary | ICD-10-CM | POA: Diagnosis not present

## 2017-05-17 DIAGNOSIS — I700 Atherosclerosis of aorta: Secondary | ICD-10-CM | POA: Diagnosis not present

## 2017-09-05 DIAGNOSIS — Z86010 Personal history of colonic polyps: Secondary | ICD-10-CM | POA: Diagnosis not present

## 2017-09-05 DIAGNOSIS — K635 Polyp of colon: Secondary | ICD-10-CM | POA: Diagnosis not present

## 2017-09-10 DIAGNOSIS — K635 Polyp of colon: Secondary | ICD-10-CM | POA: Diagnosis not present

## 2017-09-18 DIAGNOSIS — I119 Hypertensive heart disease without heart failure: Secondary | ICD-10-CM | POA: Diagnosis not present

## 2017-09-18 DIAGNOSIS — Z23 Encounter for immunization: Secondary | ICD-10-CM | POA: Diagnosis not present

## 2017-09-18 DIAGNOSIS — I2510 Atherosclerotic heart disease of native coronary artery without angina pectoris: Secondary | ICD-10-CM | POA: Diagnosis not present

## 2017-09-18 DIAGNOSIS — R7301 Impaired fasting glucose: Secondary | ICD-10-CM | POA: Diagnosis not present

## 2017-09-18 DIAGNOSIS — E039 Hypothyroidism, unspecified: Secondary | ICD-10-CM | POA: Diagnosis not present

## 2017-09-18 DIAGNOSIS — R42 Dizziness and giddiness: Secondary | ICD-10-CM | POA: Diagnosis not present

## 2017-09-18 DIAGNOSIS — E782 Mixed hyperlipidemia: Secondary | ICD-10-CM | POA: Diagnosis not present

## 2017-09-24 DIAGNOSIS — H25813 Combined forms of age-related cataract, bilateral: Secondary | ICD-10-CM | POA: Diagnosis not present

## 2017-09-24 DIAGNOSIS — H524 Presbyopia: Secondary | ICD-10-CM | POA: Diagnosis not present

## 2017-09-27 DIAGNOSIS — Z1231 Encounter for screening mammogram for malignant neoplasm of breast: Secondary | ICD-10-CM | POA: Diagnosis not present

## 2017-10-01 DIAGNOSIS — N6321 Unspecified lump in the left breast, upper outer quadrant: Secondary | ICD-10-CM | POA: Diagnosis not present

## 2017-10-01 DIAGNOSIS — N6323 Unspecified lump in the left breast, lower outer quadrant: Secondary | ICD-10-CM | POA: Diagnosis not present

## 2017-10-01 DIAGNOSIS — N6489 Other specified disorders of breast: Secondary | ICD-10-CM | POA: Diagnosis not present

## 2017-10-07 DIAGNOSIS — N6323 Unspecified lump in the left breast, lower outer quadrant: Secondary | ICD-10-CM | POA: Diagnosis not present

## 2017-10-07 DIAGNOSIS — N6321 Unspecified lump in the left breast, upper outer quadrant: Secondary | ICD-10-CM | POA: Diagnosis not present

## 2017-10-07 DIAGNOSIS — N6012 Diffuse cystic mastopathy of left breast: Secondary | ICD-10-CM | POA: Diagnosis not present

## 2017-10-14 DIAGNOSIS — H2512 Age-related nuclear cataract, left eye: Secondary | ICD-10-CM | POA: Diagnosis not present

## 2017-10-14 DIAGNOSIS — H2511 Age-related nuclear cataract, right eye: Secondary | ICD-10-CM | POA: Diagnosis not present

## 2017-10-21 DIAGNOSIS — H2511 Age-related nuclear cataract, right eye: Secondary | ICD-10-CM | POA: Diagnosis not present

## 2017-10-21 DIAGNOSIS — H25811 Combined forms of age-related cataract, right eye: Secondary | ICD-10-CM | POA: Diagnosis not present

## 2017-11-13 DIAGNOSIS — H2512 Age-related nuclear cataract, left eye: Secondary | ICD-10-CM | POA: Diagnosis not present

## 2017-11-13 DIAGNOSIS — H25812 Combined forms of age-related cataract, left eye: Secondary | ICD-10-CM | POA: Diagnosis not present

## 2017-11-14 ENCOUNTER — Ambulatory Visit: Payer: Medicare HMO | Admitting: Cardiovascular Disease

## 2017-11-14 ENCOUNTER — Encounter: Payer: Self-pay | Admitting: Cardiovascular Disease

## 2017-11-14 VITALS — BP 132/60 | HR 53 | Ht 61.5 in | Wt 130.1 lb

## 2017-11-14 DIAGNOSIS — E785 Hyperlipidemia, unspecified: Secondary | ICD-10-CM

## 2017-11-14 DIAGNOSIS — I10 Essential (primary) hypertension: Secondary | ICD-10-CM

## 2017-11-14 DIAGNOSIS — I25119 Atherosclerotic heart disease of native coronary artery with unspecified angina pectoris: Secondary | ICD-10-CM | POA: Diagnosis not present

## 2017-11-14 DIAGNOSIS — I739 Peripheral vascular disease, unspecified: Secondary | ICD-10-CM | POA: Diagnosis not present

## 2017-11-14 NOTE — Patient Instructions (Signed)
Medication Instructions:  Your provider recommends that you continue on your current medications as directed. Please refer to the Current Medication list given to you today.    Labwork: None  Testing/Procedures: Your provider has requested that you have a lower extremity arterial duplex. During this test, ultrasound is used to evaluate arterial blood flow in the legs. Allow one hour for this exam. There are no restrictions or special instructions.    Follow-Up: Your provider wants you to follow-up in: 1 year with Dr .Burt Knack. You will receive a reminder letter in the mail two months in advance. If you don't receive a letter, please call our office to schedule the follow-up appointment.    Any Other Special Instructions Will Be Listed Below (If Applicable).     If you need a refill on your cardiac medications before your next appointment, please call your pharmacy.

## 2017-11-14 NOTE — Progress Notes (Signed)
Cardiology Office Note Date:  11/14/2017   ID:  Erica [name], DOB 1941-03-04, MRN [id number]  PCP:  Mayra Neer, MD  Cardiologist:  [name], MD    Chief Complaint  Patient presents with  . Chest Pain     History of Present Illness: Erica [name] is a 76 y.o. female who presents for follow-up evaluation.  The patient has CAD and underwent CABG in 2011 with grafting of the LIMA to LAD, saphenous vein graft to PDA and PLA, and saphenous vein graft to OM.  She has been followed for mild to moderate carotid stenosis with serial duplex imaging.  She has hyperlipidemia managed by her primary physician.  When she was seen 1 year ago she complained of chest discomfort with typical and atypical features.  A stress Myoview scan demonstrated normal perfusion and normal LV function.  She is here alone today. Complains of left calf pain with walking up hill. Resolves with rest. No symptoms on level ground but this occurs over the last 6 months, even with a mild grade. No right leg pain.  Has had 2 episodes of substernal chest burning/pressure, both at rest.  She has taken nitroglycerin with prompt relief.  She walks regularly with no exertional chest pain or pressure.  No shortness of breath.  No orthopnea, PND, or heart palpitations.  Past Medical History:  Diagnosis Date  . Anxiety   . Coronary artery disease    status post multiple percutaneous interventions  . Hyperlipidemia   . Hypertension   . Hypothyroidism   . Kidney calculus 2016  . Osteoarthritis     Past Surgical History:  Procedure Laterality Date  . ANKLE SURGERY    . CARDIAC CATHETERIZATION  12-2008  . CORONARY ARTERY BYPASS GRAFT  01-25-10   x4  . CORONARY STENT PLACEMENT  2006    right coronary artery with drug-eluting stent  . [place]    bare-metal stent to the right coronary artery  . CORONARY STENT PLACEMENT  2008   drug-eluting stent placed in the left circumflex  . CYSTOSCOPY  WITH RETROGRADE PYELOGRAM, URETEROSCOPY AND STENT PLACEMENT Right 10/18/2015   Procedure: CYSTOSCOPY WITH RETROGRADE PYELOGRAM,  AND STENT PLACEMENT;  Surgeon: Festus Aloe, MD;  Location: [facility];  Service: Urology;  Laterality: Right;  . CYSTOSCOPY/URETEROSCOPY/HOLMIUM LASER/STENT PLACEMENT Right 11/29/2015   Procedure: RIGHT URETEROSCOPY/RIGHT RETROGRADE PYELOGRAM/ RIGHT STENT REPLACEMENT;  Surgeon: Festus Aloe, MD;  Location: [facility];  Service: Urology;  Laterality: Right;    Current Outpatient Medications  Medication Sig Dispense Refill  . aspirin 81 MG tablet Take 1 tablet (81 mg total) by mouth daily. 30 tablet 0  . atenolol (TENORMIN) 25 MG tablet Take 25 mg by mouth daily.    Marland Kitchen atorvastatin (LIPITOR) 80 MG tablet Take 80 mg by mouth daily.    . hydrochlorothiazide (HYDRODIURIL) 25 MG tablet Take 25 mg by mouth daily.    Marland Kitchen ibuprofen (ADVIL,MOTRIN) 200 MG tablet Take 400 mg by mouth every 6 (six) hours as needed for fever, headache or moderate pain.    Marland Kitchen levothyroxine (SYNTHROID, LEVOTHROID) 100 MCG tablet Take 100 mcg by mouth daily.    . nitroGLYCERIN (NITROSTAT) 0.4 MG SL tablet Place 1 tablet (0.4 mg total) under the tongue every 5 (five) minutes as needed for chest pain. 25 tablet 3   No current facility-administered medications for this visit.     Allergies:   Patient has no known allergies.   Social  History:  The patient  reports that she quit smoking about 20 years ago. she has never used smokeless tobacco. She reports that she does not drink alcohol or use drugs.   Family History:  The patient's family history includes Other in her unknown relative; Other (age of onset: 75) in her mother.    ROS:  Please see the history of present illness.  Otherwise, review of systems is positive for hearing loss.  All other systems are reviewed and negative.    PHYSICAL EXAM: VS:  BP 132/60   Pulse (!) 53   Ht 5' 1.5" (1.562 m)   Wt 130 lb 1.9 oz (59 kg)    BMI 24.19 kg/m  , BMI Body mass index is 24.19 kg/m. GEN: Well nourished, well developed, in no acute distress  HEENT: normal  Neck: no JVD, no masses. Positive BL carotid bruits R>L Cardiac: RRR without murmur or gallop     Respiratory:  clear to auscultation bilaterally, normal work of breathing GI: soft, nontender, nondistended, + BS MS: no deformity or atrophy  Ext: no pretibial edema, left DP 1+, left PT absent, right DP 2+, right PT 1+  skin: warm and dry, no rash Neuro:  Strength and sensation are intact Psych: euthymic mood, full affect  EKG:  EKG is not ordered today.  Recent Labs: No results found for requested labs within last 8760 hours.   Lipid Panel     Component Value Date/Time   CHOL  01/09/2010 0506    131        ATP III CLASSIFICATION:  <200     mg/dL   Desirable  200-239  mg/dL   Borderline High  >=240    mg/dL   High          TRIG 87 01/09/2010 0506   HDL 48 01/09/2010 0506   CHOLHDL 2.7 01/09/2010 0506   VLDL 17 01/09/2010 0506   LDLCALC  01/09/2010 0506    66        Total Cholesterol/HDL:CHD Risk Coronary Heart Disease Risk Table                     Men   Women  1/2 Average Risk   3.4   3.3  Average Risk       5.0   4.4  2 X Average Risk   9.6   7.1  3 X Average Risk  23.4   11.0        Use the calculated Patient Ratio above and the CHD Risk Table to determine the patient's CHD Risk.        ATP III CLASSIFICATION (LDL):  <100     mg/dL   Optimal  100-129  mg/dL   Near or Above                    Optimal  130-159  mg/dL   Borderline  160-189  mg/dL   High  >190     mg/dL   Very High      Wt Readings from Last 3 Encounters:  11/14/17 130 lb 1.9 oz (59 kg)  12/13/16 129 lb (58.5 kg)  12/04/16 129 lb (58.5 kg)     Cardiac Studies Reviewed: Carl[facility] Adam Myoview 12-04-2016: Study Highlights     Nuclear stress EF: 60%.  There was no ST segment deviation noted during stress.  The study is normal.  The left ventricular ejection  fraction is normal (55-65%).   Normal  study, no evidence for ischemia or infarction.    Carotid duplex scan 12/07/2016: 40-59% bilateral ICA stenosis.  ASSESSMENT AND PLAN: 1.  Coronary artery disease, native vessel, without angina: She is taken 2 sublingual nitroglycerin since her last visit here 1 year ago.  Both of these were taken for resting chest discomfort that she thinks may be her indigestion.  She has no exertional symptoms, had a negative Myoview scan 1 year ago, and will continue on her current medical program.  2.  Hypertension: Blood pressure is controlled on atenolol and hydrochlorothiazide.  3.  Hyperlipidemia: Treated with atorvastatin 80 mg daily.  Lipids followed by Dr. Brigitte Pulse.  4.  Carotid stenosis without history of stroke.  Most recent Doppler study reviewed. Stable findings - continue medical therapy.  5.  Intermittent claudication: The patient describes highly typical symptoms of left calf claudication.  Her left pedal pulse is palpable but diminished.  Recommend ABIs and a left lower extremity arterial Doppler study.  Discussed potential findings and treatment strategies.  For now we will continue on aspirin and a statin drug. Consider PV referral if significant arterial obstruction identified.   Current medicines are reviewed with the patient today.  The patient does not have concerns regarding medicines.  Labs/ tests ordered today include:  No orders of the defined types were placed in this encounter.   Disposition:   FU one year  Signed, [name], MD  11/14/2017 5:37 PM    [facility] [address], [place], [place]  [address] Phone: [phone]; Fax: [phone]

## 2017-11-22 ENCOUNTER — Ambulatory Visit (HOSPITAL_COMMUNITY)
Admission: RE | Admit: 2017-11-22 | Discharge: 2017-11-22 | Disposition: A | Discharge status: Home / Self Care | Payer: Medicare HMO | Source: Ambulatory Visit | Attending: Cardiovascular Disease | Admitting: Cardiovascular Disease

## 2017-11-22 ENCOUNTER — Other Ambulatory Visit: Payer: Self-pay | Admitting: Cardiovascular Disease

## 2017-11-22 DIAGNOSIS — I70292 Other atherosclerosis of native arteries of extremities, left leg: Secondary | ICD-10-CM | POA: Diagnosis not present

## 2017-11-22 DIAGNOSIS — I739 Peripheral vascular disease, unspecified: Secondary | ICD-10-CM

## 2017-11-22 DIAGNOSIS — I70201 Unspecified atherosclerosis of native arteries of extremities, right leg: Secondary | ICD-10-CM | POA: Insufficient documentation

## 2017-11-28 ENCOUNTER — Telehealth: Payer: Self-pay

## 2017-11-28 ENCOUNTER — Other Ambulatory Visit: Payer: Self-pay | Admitting: Cardiovascular Disease

## 2017-11-28 DIAGNOSIS — R6889 Other general symptoms and signs: Secondary | ICD-10-CM

## 2017-11-28 NOTE — Telephone Encounter (Signed)
-----   Message from [name], MD sent at 11/28/2017 11:14 AM EST ----- Studies reviewed. Symptoms, abnormal exam findings, and moderately reduced ABI noted on the left. However, no significant velocity elevation throughout the LLE. Pt may have inflow/iliac disease on the left. Recommend a CTA of the abdominal aorta with bilateral LE runoff for better evaluation of the proximal vessels. Please refer for PV consult after the CTA is completed. thx

## 2017-11-28 NOTE — Telephone Encounter (Signed)
Informed patient of results and verbal understanding expressed.  CTA abdomen with bilateral LE run-off ordered for scheduling. Patient understands she will be scheduled for [facility] consult after CTA is arranged. She was grateful for call and agrees with treatment plan.

## 2017-11-29 ENCOUNTER — Other Ambulatory Visit: Payer: Self-pay

## 2017-11-29 DIAGNOSIS — I10 Essential (primary) hypertension: Secondary | ICD-10-CM

## 2017-12-02 ENCOUNTER — Other Ambulatory Visit: Payer: Medicare HMO | Admitting: *Deleted

## 2017-12-02 DIAGNOSIS — I10 Essential (primary) hypertension: Secondary | ICD-10-CM

## 2017-12-03 LAB — BASIC METABOLIC PANEL
BUN / CREAT RATIO: 21 (ref 12–28)
BUN: 24 mg/dL (ref 8–27)
CALCIUM: 9.3 mg/dL (ref 8.7–10.3)
CHLORIDE: 105 mmol/L (ref 96–106)
CO2: 24 mmol/L (ref 20–29)
CREATININE: 1.15 mg/dL — AB (ref 0.57–1.00)
GFR calc Af Amer: 53 mL/min/{1.73_m2} — ABNORMAL LOW (ref >59)
GFR calc non Af Amer: 46 mL/min/{1.73_m2} — ABNORMAL LOW (ref >59)
GLUCOSE: 96 mg/dL (ref 65–99)
Potassium: 4.2 mmol/L (ref 3.5–5.2)
Sodium: 145 mmol/L — ABNORMAL HIGH (ref 134–144)

## 2017-12-06 ENCOUNTER — Ambulatory Visit (INDEPENDENT_AMBULATORY_CARE_PROVIDER_SITE_OTHER)
Admission: RE | Admit: 2017-12-06 | Discharge: 2017-12-06 | Disposition: A | Discharge status: Home / Self Care | Payer: Medicare HMO | Source: Ambulatory Visit | Attending: Cardiovascular Disease | Admitting: Cardiovascular Disease

## 2017-12-06 DIAGNOSIS — R6889 Other general symptoms and signs: Secondary | ICD-10-CM | POA: Diagnosis not present

## 2017-12-06 DIAGNOSIS — N200 Calculus of kidney: Secondary | ICD-10-CM | POA: Diagnosis not present

## 2017-12-06 MED ORDER — IOPAMIDOL (ISOVUE-370) INJECTION 76%
125.0000 mL | Freq: Once | INTRAVENOUS | Status: AC | PRN
  Administered 2017-12-06: 125 mL via INTRAVENOUS

## 2017-12-10 ENCOUNTER — Ambulatory Visit: Payer: Medicare HMO | Admitting: Cardiovascular Disease

## 2017-12-10 ENCOUNTER — Encounter: Payer: Self-pay | Admitting: Cardiovascular Disease

## 2017-12-10 VITALS — BP 128/62 | HR 50 | Ht 61.0 in | Wt 129.0 lb

## 2017-12-10 DIAGNOSIS — E785 Hyperlipidemia, unspecified: Secondary | ICD-10-CM

## 2017-12-10 DIAGNOSIS — I701 Atherosclerosis of renal artery: Secondary | ICD-10-CM | POA: Diagnosis not present

## 2017-12-10 DIAGNOSIS — I2510 Atherosclerotic heart disease of native coronary artery without angina pectoris: Secondary | ICD-10-CM | POA: Diagnosis not present

## 2017-12-10 DIAGNOSIS — I739 Peripheral vascular disease, unspecified: Secondary | ICD-10-CM

## 2017-12-10 DIAGNOSIS — I10 Essential (primary) hypertension: Secondary | ICD-10-CM | POA: Diagnosis not present

## 2017-12-10 NOTE — Progress Notes (Signed)
Cardiology Office Note   Date:  12/10/2017   ID:  Erica [name], DOB 08/20/1941, MRN [id number]  PCP:  Mayra Neer, MD  Cardiologist:  Dr. Burt Knack   Chief Complaint  Patient presents with  . Follow-up      History of Present Illness: Erica [name] is a 77 y.o. female who was referred by Dr. Burt Knack for evaluation and management of peripheral arterial disease.  She has known history of coronary artery disease status post CABG in 2011, mild to moderate carotid disease, hyperlipidemia and essential hypertension.  She quit smoking at the age of 42. She recently complained of left calf claudication.  Vascular studies showed mildly reduced ABI on the left at 0.76 with no significant velocity elevation on duplex.  She underwent CTA angiogram which showed significant narrowing of the distal aorta with high-grade stenosis of the mid left common femoral artery and moderate stenosis affecting the left popliteal artery with three-vessel runoff below the knee.  On the right, there was moderate stenosis affecting the right common iliac artery and moderate stenosis affecting the popliteal artery.  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 She reports mostly left calf claudication that started about 1 year ago.  No significant thigh or lower back discomfort.  She notices the symptoms mainly with incline going up to her mailbox.  No significant discomfort on flat level.  She also reports nighttime cramps in both legs worse on the left side.  No lower extremity ulceration. She denies any postprandial abdominal pain or weight loss.   Past Medical History:  Diagnosis Date  . Anxiety   . Coronary artery disease    status post multiple percutaneous interventions  . Hyperlipidemia   . Hypertension   . Hypothyroidism   . Kidney calculus 2016  . Osteoarthritis     Past Surgical History:  Procedure Laterality Date  . ANKLE SURGERY    . CARDIAC  CATHETERIZATION  12-2008  . CORONARY ARTERY BYPASS GRAFT  01-25-10   x4  . CORONARY STENT PLACEMENT  2006    right coronary artery with drug-eluting stent  . [place]    bare-metal stent to the right coronary artery  . CORONARY STENT PLACEMENT  2008   drug-eluting stent placed in the left circumflex  . CYSTOSCOPY WITH RETROGRADE PYELOGRAM, URETEROSCOPY AND STENT PLACEMENT Right 10/18/2015   Procedure: CYSTOSCOPY WITH RETROGRADE PYELOGRAM,  AND STENT PLACEMENT;  Surgeon: Festus Aloe, MD;  Location: [facility];  Service: Urology;  Laterality: Right;  . CYSTOSCOPY/URETEROSCOPY/HOLMIUM LASER/STENT PLACEMENT Right 11/29/2015   Procedure: RIGHT URETEROSCOPY/RIGHT RETROGRADE PYELOGRAM/ RIGHT STENT REPLACEMENT;  Surgeon: Festus Aloe, MD;  Location: [facility];  Service: Urology;  Laterality: Right;     Current Outpatient Medications  Medication Sig Dispense Refill  . aspirin 81 MG tablet Take 1 tablet (81 mg total) by mouth daily. 30 tablet 0  . atenolol (TENORMIN) 25 MG tablet Take 25 mg by mouth daily.    Erica [name] atorvastatin (LIPITOR) 80 MG tablet Take 80 mg by mouth daily.    . hydrochlorothiazide (HYDRODIURIL) 25 MG tablet Take 25 mg by mouth daily.    Erica [name] ibuprofen (ADVIL,MOTRIN) 200 MG tablet Take 400 mg by mouth every 6 (six) hours as needed for fever, headache or moderate pain.    Erica [name] levothyroxine (SYNTHROID, LEVOTHROID) 100 MCG tablet Take 100 mcg by mouth daily.    . nitroGLYCERIN (NITROSTAT) 0.4 MG SL tablet Place 1  tablet (0.4 mg total) under the tongue every 5 (five) minutes as needed for chest pain. 25 tablet 3   No current facility-administered medications for this visit.     Allergies:   Morphine and related    Social History:  The patient  reports that she quit smoking about 21 years ago. she has never used smokeless tobacco. She reports that she does not drink alcohol or use drugs.   Family History:  The patient's family history includes  Other in her unknown relative; Other (age of onset: 64) in her mother.    ROS:  Please see the history of present illness.   Otherwise, review of systems are positive for none.   All other systems are reviewed and negative.    PHYSICAL EXAM: VS:  BP 128/62   Pulse (!) 50   Ht 5\' 1"  (1.549 m)   Wt 129 lb (58.5 kg)   BMI 24.37 kg/m  , BMI Body mass index is 24.37 kg/m. GEN: Well nourished, well developed, in no acute distress  HEENT: normal  Neck: no JVD, or masses.  Faint right carotid bruit Cardiac: RRR; no murmurs, rubs, or gallops,no edema  Respiratory:  clear to auscultation bilaterally, normal work of breathing GI: soft, nontender, nondistended, + BS MS: no deformity or atrophy  Skin: warm and dry, no rash Neuro:  Strength and sensation are intact Psych: euthymic mood, full affect Vascular: Femoral pulses +2 on the right side and +1 on the left.  Distal pulses are diminished but palpable.   EKG:  EKG is ordered today. The ekg ordered today demonstrates sinus bradycardia with nonspecific ST and T wave changes.   Recent Labs: 12/02/2017: BUN 24; Creatinine, Ser 1.15; Potassium 4.2; Sodium 145    Lipid Panel    Component Value Date/Time   CHOL  01/09/2010 0506    131        ATP III CLASSIFICATION:  <200     mg/dL   Desirable  200-239  mg/dL   Borderline High  >=240    mg/dL   High          TRIG 87 01/09/2010 0506   HDL 48 01/09/2010 0506   CHOLHDL 2.7 01/09/2010 0506   VLDL 17 01/09/2010 0506   LDLCALC  01/09/2010 0506    66        Total Cholesterol/HDL:CHD Risk Coronary Heart Disease Risk Table                     Men   Women  1/2 Average Risk   3.4   3.3  Average Risk       5.0   4.4  2 X Average Risk   9.6   7.1  3 X Average Risk  23.4   11.0        Use the calculated Patient Ratio above and the CHD Risk Table to determine the patient's CHD Risk.        ATP III CLASSIFICATION (LDL):  <100     mg/dL   Optimal  100-129  mg/dL   Near or Above                     Optimal  130-159  mg/dL   Borderline  160-189  mg/dL   High  >190     mg/dL   Very High      Wt Readings from Last 3 Encounters:  12/10/17 129 lb (58.5 kg)  11/14/17 130  lb 1.9 oz (59 kg)  12/13/16 129 lb (58.5 kg)      No flowsheet data found.    ASSESSMENT AND PLAN:  1.  Peripheral arterial disease: Moderate left leg claudication mainly due to stenosis in the left common iliac artery.  She has other moderate disease that does not seem to be flow-limiting.  I discussed with her the natural history and management of peripheral arterial disease.  I discussed the option of proceeding with angiography and endovascular intervention.  However, she does not feel that her symptoms are lifestyle limiting and she is able to walk a long distance on flat level. Continue medical therapy for now and proceed with angiography if symptoms worsen. We can consider the addition of small dose Xarelto given coronary artery disease and peripheral arterial disease.  2.  Coronary artery disease involving native coronary arteries: No angina.  3.  Renal artery stenosis: Noted on CTA on the right side.  No indication for revascularization given that her blood pressure is controlled with no heart failure or progressive chronic kidney disease.  4.  Celiac artery stenosis: Asymptomatic.  No abdominal angina.  5.  Essential hypertension: Blood pressure is controlled on current medications.  6.  Hyperlipidemia: Continue high-dose atorvastatin with a target LDL of less than 70.    Disposition:   FU with me in 6 months  Signed,  Kathlyn Sacramento, MD  12/10/2017 11:08 AM    [place]

## 2017-12-10 NOTE — Patient Instructions (Signed)

## 2017-12-13 DIAGNOSIS — N179 Acute kidney failure, unspecified: Secondary | ICD-10-CM | POA: Diagnosis not present

## 2017-12-17 ENCOUNTER — Institutional Professional Consult (permissible substitution): Payer: Medicare HMO | Admitting: Cardiovascular Disease

## 2017-12-24 ENCOUNTER — Other Ambulatory Visit: Payer: Self-pay | Admitting: Cardiovascular Disease

## 2017-12-24 DIAGNOSIS — I6523 Occlusion and stenosis of bilateral carotid arteries: Secondary | ICD-10-CM

## 2017-12-25 ENCOUNTER — Ambulatory Visit (HOSPITAL_COMMUNITY)
Admission: RE | Admit: 2017-12-25 | Discharge: 2017-12-25 | Disposition: A | Discharge status: Home / Self Care | Payer: Medicare HMO | Source: Ambulatory Visit | Attending: Cardiology | Admitting: Cardiology

## 2017-12-25 DIAGNOSIS — E785 Hyperlipidemia, unspecified: Secondary | ICD-10-CM | POA: Insufficient documentation

## 2017-12-25 DIAGNOSIS — I10 Essential (primary) hypertension: Secondary | ICD-10-CM | POA: Insufficient documentation

## 2017-12-25 DIAGNOSIS — Z87891 Personal history of nicotine dependence: Secondary | ICD-10-CM | POA: Insufficient documentation

## 2017-12-25 DIAGNOSIS — I2510 Atherosclerotic heart disease of native coronary artery without angina pectoris: Secondary | ICD-10-CM | POA: Insufficient documentation

## 2017-12-25 DIAGNOSIS — I6523 Occlusion and stenosis of bilateral carotid arteries: Secondary | ICD-10-CM | POA: Diagnosis not present

## 2017-12-31 DIAGNOSIS — H26491 Other secondary cataract, right eye: Secondary | ICD-10-CM | POA: Diagnosis not present

## 2017-12-31 DIAGNOSIS — H524 Presbyopia: Secondary | ICD-10-CM | POA: Diagnosis not present

## 2018-01-01 ENCOUNTER — Ambulatory Visit: Payer: Medicare HMO | Admitting: Cardiovascular Disease

## 2018-01-01 DIAGNOSIS — I701 Atherosclerosis of renal artery: Secondary | ICD-10-CM | POA: Diagnosis not present

## 2018-01-01 DIAGNOSIS — I119 Hypertensive heart disease without heart failure: Secondary | ICD-10-CM | POA: Diagnosis not present

## 2018-01-01 DIAGNOSIS — I700 Atherosclerosis of aorta: Secondary | ICD-10-CM | POA: Diagnosis not present

## 2018-01-01 DIAGNOSIS — R7301 Impaired fasting glucose: Secondary | ICD-10-CM | POA: Diagnosis not present

## 2018-01-01 DIAGNOSIS — I739 Peripheral vascular disease, unspecified: Secondary | ICD-10-CM | POA: Diagnosis not present

## 2018-01-01 DIAGNOSIS — I2510 Atherosclerotic heart disease of native coronary artery without angina pectoris: Secondary | ICD-10-CM | POA: Diagnosis not present

## 2018-01-01 DIAGNOSIS — F322 Major depressive disorder, single episode, severe without psychotic features: Secondary | ICD-10-CM | POA: Diagnosis not present

## 2018-01-01 DIAGNOSIS — E782 Mixed hyperlipidemia: Secondary | ICD-10-CM | POA: Diagnosis not present

## 2018-01-01 DIAGNOSIS — E039 Hypothyroidism, unspecified: Secondary | ICD-10-CM | POA: Diagnosis not present

## 2018-01-02 DIAGNOSIS — I119 Hypertensive heart disease without heart failure: Secondary | ICD-10-CM | POA: Diagnosis not present

## 2018-01-14 DIAGNOSIS — H26492 Other secondary cataract, left eye: Secondary | ICD-10-CM | POA: Diagnosis not present

## 2018-04-03 DIAGNOSIS — N6002 Solitary cyst of left breast: Secondary | ICD-10-CM | POA: Diagnosis not present

## 2018-04-03 DIAGNOSIS — R7301 Impaired fasting glucose: Secondary | ICD-10-CM | POA: Diagnosis not present

## 2018-04-03 DIAGNOSIS — E039 Hypothyroidism, unspecified: Secondary | ICD-10-CM | POA: Diagnosis not present

## 2018-05-21 ENCOUNTER — Telehealth: Payer: Self-pay | Admitting: Cardiovascular Disease

## 2018-05-21 DIAGNOSIS — Z0000 Encounter for general adult medical examination without abnormal findings: Secondary | ICD-10-CM | POA: Diagnosis not present

## 2018-05-21 DIAGNOSIS — F322 Major depressive disorder, single episode, severe without psychotic features: Secondary | ICD-10-CM | POA: Diagnosis not present

## 2018-05-21 DIAGNOSIS — H3530 Unspecified macular degeneration: Secondary | ICD-10-CM | POA: Diagnosis not present

## 2018-05-21 DIAGNOSIS — I2510 Atherosclerotic heart disease of native coronary artery without angina pectoris: Secondary | ICD-10-CM | POA: Diagnosis not present

## 2018-05-21 DIAGNOSIS — E782 Mixed hyperlipidemia: Secondary | ICD-10-CM | POA: Diagnosis not present

## 2018-05-21 DIAGNOSIS — I700 Atherosclerosis of aorta: Secondary | ICD-10-CM | POA: Diagnosis not present

## 2018-05-21 DIAGNOSIS — I739 Peripheral vascular disease, unspecified: Secondary | ICD-10-CM | POA: Diagnosis not present

## 2018-05-21 DIAGNOSIS — I119 Hypertensive heart disease without heart failure: Secondary | ICD-10-CM | POA: Diagnosis not present

## 2018-05-21 DIAGNOSIS — R7301 Impaired fasting glucose: Secondary | ICD-10-CM | POA: Diagnosis not present

## 2018-05-21 DIAGNOSIS — E039 Hypothyroidism, unspecified: Secondary | ICD-10-CM | POA: Diagnosis not present

## 2018-05-21 NOTE — Telephone Encounter (Signed)
Called to schedule appointment with patient per PCP request. She states she had an appointment today and they recommended she is seen by Cardiology.  She cancelled her appointment with Dr. Burt Knack in January and never rescheduled. Scheduled patient with Truitt Merle, NP on Monday. She understands to seek emergency medical attention if symptoms worsen prior to that time. She was grateful for assistance.

## 2018-05-21 NOTE — Telephone Encounter (Signed)
New message   PCP calling to request appt for patient.   Pt c/o of Chest Pain: STAT if CP now or developed within 24 hours  1. Are you having CP right now? NO, patient is at PCP office  2. Are you experiencing any other symptoms (ex. SOB, nausea, vomiting, sweating)? No  3. How long have you been experiencing CP? 1 month  4. Is your CP continuous or coming and going? Coming and going  5. Have you taken Nitroglycerin? YES ?

## 2018-05-23 ENCOUNTER — Encounter: Payer: Self-pay | Admitting: Nurse Practitioner

## 2018-05-26 ENCOUNTER — Ambulatory Visit: Payer: Medicare HMO | Admitting: Nurse Practitioner

## 2018-05-26 ENCOUNTER — Encounter: Payer: Self-pay | Admitting: Nurse Practitioner

## 2018-05-26 VITALS — BP 138/78 | HR 49 | Ht 61.0 in | Wt 126.8 lb

## 2018-05-26 DIAGNOSIS — I25119 Atherosclerotic heart disease of native coronary artery with unspecified angina pectoris: Secondary | ICD-10-CM

## 2018-05-26 LAB — CBC
Hematocrit: 41.3 % (ref 34.0–46.6)
Hemoglobin: 14 g/dL (ref 11.1–15.9)
MCH: 29.8 pg (ref 26.6–33.0)
MCHC: 33.9 g/dL (ref 31.5–35.7)
MCV: 88 fL (ref 79–97)
Platelets: 271 10*3/uL (ref 150–450)
RBC: 4.7 x10E6/uL (ref 3.77–5.28)
RDW: 13.5 % (ref 12.3–15.4)
WBC: 7.3 10*3/uL (ref 3.4–10.8)

## 2018-05-26 LAB — COMPREHENSIVE METABOLIC PANEL
ALT: 21 IU/L (ref 0–32)
AST: 18 IU/L (ref 0–40)
Albumin/Globulin Ratio: 2.4 — ABNORMAL HIGH (ref 1.2–2.2)
Albumin: 4.5 g/dL (ref 3.5–4.8)
Alkaline Phosphatase: 71 IU/L (ref 39–117)
BUN/Creatinine Ratio: 26 (ref 12–28)
BUN: 22 mg/dL (ref 8–27)
Bilirubin Total: 0.7 mg/dL (ref 0.0–1.2)
CO2: 24 mmol/L (ref 20–29)
Calcium: 9.6 mg/dL (ref 8.7–10.3)
Chloride: 101 mmol/L (ref 96–106)
Creatinine, Ser: 0.86 mg/dL (ref 0.57–1.00)
GFR calc Af Amer: 75 mL/min/{1.73_m2} (ref >59)
GFR calc non Af Amer: 65 mL/min/{1.73_m2} (ref >59)
Globulin, Total: 1.9 g/dL (ref 1.5–4.5)
Glucose: 129 mg/dL — ABNORMAL HIGH (ref 65–99)
Potassium: 3.8 mmol/L (ref 3.5–5.2)
Sodium: 141 mmol/L (ref 134–144)
Total Protein: 6.4 g/dL (ref 6.0–8.5)

## 2018-05-26 NOTE — Progress Notes (Signed)
CARDIOLOGY OFFICE NOTE  Date:  05/26/2018    Erica [name] Date of Birth: [birth year] Medical Record #[id number]  PCP:  Mayra Neer, MD  Cardiologist:  Burt Knack  Chief Complaint  Patient presents with  . Coronary Artery Disease  . Chest Pain    Work in visit - seen for Dr. Burt Knack    History of Present Illness: Erica [name] is a 77 y.o. female who presents today for a work in visit. Seen for Dr. Burt Knack.   She has a history of CAD with remote CABG in 2011 with grafting of the LIMA to LAD, saphenous vein graft to PDA and PLA, and saphenous vein graft to OM - per Dr. Cyndia Bent.  Her other issues include carotid disease, HLD and PAD - followed by Dr. Fletcher Anon.   Last Myoview was in January of 2018 - this demonstrated normal perfusion and normal LV function.   Last seen by Dr. Burt Knack in December - some claudication. Had had 2 episodes of chest pain - used NTG with prompt relief - this was at rest - she felt it was more related to indigestion. Saw Dr. Fletcher Anon in January of 2019. She opted for continued medical therapy at that time.   Comes in today. Here alone. 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 She notes increasing frequency. Occurs with and without exertion - has had with watching TV as well as with walking. She will use NTG sometimes but not all the time. She notes more claudication as well and is seeing Dr. Fletcher Anon again next month for a recheck. She is chronically bradycardic - not symptomatic - denies being dizzy or lightheaded. No syncope reported.   Past Medical History:  Diagnosis Date  . Anxiety   . Coronary artery disease    status post multiple percutaneous interventions  . Hyperlipidemia   . Hypertension   . Hypothyroidism   . Kidney calculus 2016  . Osteoarthritis     Past Surgical History:  Procedure Laterality Date  . ANKLE SURGERY    .  CARDIAC CATHETERIZATION  12-2008  . CORONARY ARTERY BYPASS GRAFT  01-25-10   x4  . CORONARY STENT PLACEMENT  2006    right coronary artery with drug-eluting stent  . [place]    bare-metal stent to the right coronary artery  . CORONARY STENT PLACEMENT  2008   drug-eluting stent placed in the left circumflex  . CYSTOSCOPY WITH RETROGRADE PYELOGRAM, URETEROSCOPY AND STENT PLACEMENT Right 10/18/2015   Procedure: CYSTOSCOPY WITH RETROGRADE PYELOGRAM,  AND STENT PLACEMENT;  Surgeon: Festus Aloe, MD;  Location: [facility];  Service: Urology;  Laterality: Right;  . CYSTOSCOPY/URETEROSCOPY/HOLMIUM LASER/STENT PLACEMENT Right 11/29/2015   Procedure: RIGHT URETEROSCOPY/RIGHT RETROGRADE PYELOGRAM/ RIGHT STENT REPLACEMENT;  Surgeon: Festus Aloe, MD;  Location: [facility];  Service: Urology;  Laterality: Right;     Medications: Current Meds  Medication Sig  . aspirin 81 MG tablet Take 1 tablet (81 mg total) by mouth daily.  Marland Kitchen atenolol (TENORMIN) 25 MG tablet Take 25 mg by mouth daily.  Marland Kitchen atorvastatin (LIPITOR) 80 MG tablet Take 80 mg by mouth daily.  . hydrochlorothiazide (HYDRODIURIL) 25 MG tablet Take 25 mg by mouth daily.  Marland Kitchen ibuprofen (ADVIL,MOTRIN) 200 MG tablet Take 400 mg by mouth every 6 (six) hours as needed for fever, headache or moderate pain.  Marland Kitchen levothyroxine (  SYNTHROID, LEVOTHROID) 100 MCG tablet Take 100 mcg by mouth daily.  . nitroGLYCERIN (NITROSTAT) 0.4 MG SL tablet Place 1 tablet (0.4 mg total) under the tongue every 5 (five) minutes as needed for chest pain.  Marland Kitchen sertraline (ZOLOFT) 50 MG tablet Take 50 mg by mouth daily.      Allergies: Allergies  Allergen Reactions  . Morphine And Related Other (See Comments)    Sick on stomach    Social History: The patient  reports that she quit smoking about 21 years ago. She has never used smokeless tobacco. She reports that she does not drink alcohol or use drugs.   Family History: The  patient's family history includes Other in her unknown relative; Other (age of onset: 93) in her mother.   Review of Systems: Please see the history of present illness.   Otherwise, the review of systems is positive for none.   All other systems are reviewed and negative.   Physical Exam: VS:  BP 138/78   Pulse (!) 49   Ht 5\' 1"  (1.549 m)   Wt 126 lb 12.8 oz (57.5 kg)   SpO2 97%   BMI 23.96 kg/m  .  BMI Body mass index is 23.96 kg/m.  Wt Readings from Last 3 Encounters:  05/26/18 126 lb 12.8 oz (57.5 kg)  12/10/17 129 lb (58.5 kg)  11/14/17 130 lb 1.9 oz (59 kg)    General: Pleasant. Well developed, well nourished and in no acute distress.   HEENT: Normal.  Neck: Supple, no JVD, carotid bruits, or masses noted.  Cardiac: Regular rate and rhythm. No murmurs, rubs, or gallops. No edema.  Respiratory:  Lungs are clear to auscultation bilaterally with normal work of breathing.  GI: Soft and nontender.  MS: No deformity or atrophy. Gait and ROM intact.  Skin: Warm and dry. Color is normal.  Neuro:  Strength and sensation are intact and no gross focal deficits noted.  Psych: Alert, appropriate and with normal affect.   LABORATORY DATA:  EKG:  EKG is ordered today. This demonstrates sinus bradycardia - nonspecific changes -reviewed with Dr. Burt Knack.  Lab Results  Component Value Date   WBC 9.1 10/21/2015   HGB 12.6 11/29/2015   HCT 37.0 11/29/2015   PLT 351 10/21/2015   GLUCOSE 96 12/02/2017   CHOL  01/09/2010    131        ATP III CLASSIFICATION:  <200     mg/dL   Desirable  200-239  mg/dL   Borderline High  >=240    mg/dL   High          TRIG 87 01/09/2010   HDL 48 01/09/2010   LDLCALC  01/09/2010    66        Total Cholesterol/HDL:CHD Risk Coronary Heart Disease Risk Table                     Men   Women  1/2 Average Risk   3.4   3.3  Average Risk       5.0   4.4  2 X Average Risk   9.6   7.1  3 X Average Risk  23.4   11.0        Use the calculated Patient  Ratio above and the CHD Risk Table to determine the patient's CHD Risk.        ATP III CLASSIFICATION (LDL):  <100     mg/dL   Optimal  100-129  mg/dL  Near or Above                    Optimal  130-159  mg/dL   Borderline  160-189  mg/dL   High  >190     mg/dL   Very High   ALT 33 10/19/2015   AST 33 10/19/2015   NA 145 (H) 12/02/2017   K 4.2 12/02/2017   CL 105 12/02/2017   CREATININE 1.15 (H) 12/02/2017   BUN 24 12/02/2017   CO2 24 12/02/2017   TSH 1.488 Test methodology is 3rd generation TSH 01/08/2010   INR 1.33 10/18/2015   HGBA1C (H) 01/23/2010    6.2 (NOTE) The ADA recommends the following therapeutic goal for glycemic control related to Hgb A1c measurement: Goal of therapy: <6.5 Hgb A1c  Reference: American Diabetes Association: Clinical Practice Recommendations 2010, Diabetes Care, 2010, 33: (Suppl  1).       BNP (last 3 results) No results for input(s): BNP in the last 8760 hours.  ProBNP (last 3 results) No results for input(s): PROBNP in the last 8760 hours.   Other Studies Reviewed Today:  Lexiscan Myoview 12-04-2016: Study Highlights     Nuclear stress EF: 60%.  There was no ST segment deviation noted during stress.  The study is normal.  The left ventricular ejection fraction is normal (55-65%).  Normal study, no evidence for ischemia or infarction.    Carotid duplex scan 12/07/2016: 40-59% bilateral ICA stenosis.  ASSESSMENT AND PLAN:  1. CAD with prior CABG in 2011 - stable Myoview from 2018 - now with recurring spells of chest pain - discussed with Dr. Burt Knack - will plan to proceed with repeat cardiac cath - The patient understands that risks include but are not limited to stroke (1 in 1000), death (1 in 1000), kidney failure [usually temporary] (1 in 500), bleeding (1 in 200), allergic reaction [possibly serious] (1 in 200), and agrees to proceed. Scheduled for next Wednesday with Dr. Burt Knack. She is to limit her activities. Use NTG as  needed. Lab today.   2. HTN - BP ok on current regimen. No changes made today.   3. HLD - lipids from January noted - LDL not at goal - may need to consider adding Zetia or PCSK9 therapy  4. PAD - has carotid disease as well as prior arteriogram showing high-grade stenosis of the mid left common femoral artery and moderate stenosis affecting the left popliteal artery with three-vessel runoff below the knee.  On the right, there was moderate stenosis affecting the right common iliac artery and moderate stenosis affecting the popliteal artery.  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 She also endorses more claudication - seeing Dr. Fletcher Anon back next month for discussion.   Current medicines are reviewed with the patient today.  The patient does not have concerns regarding medicines other than what has been noted above.  The following changes have been made:  See above.  Labs/ tests ordered today include:    Orders Placed This Encounter  Procedures  . Comprehensive metabolic panel  . CBC     Disposition:   Further disposition pending.    Patient is agreeable to this plan and will call if any problems develop in the interim.   SignedTruitt Merle, NP  05/26/2018 11:04 AM  [facility] [address] [place], [place]  [address] Phone: [phone] Fax: [phone]

## 2018-05-26 NOTE — Patient Instructions (Addendum)
We will be checking the following labs today - CMET and CBC   Medication Instructions:    Continue with your current medicines. Use your NTG as needed.     Testing/Procedures To Be Arranged:  Cardiac catheterization with Dr. Burt Knack     Other Special Instructions:  Your provider has recommended a cardiac catherization  You are scheduled for a cardiac catheterization on Wednesday, July 3rd at 1:30 PMwith Dr. Burt Knack or associate.  Please arrive at the [facility] (Main Entrance) at [facility] at [address], [place] Stay on Wednesday, July 3rd at [place].    Special note: Every effort is made to have your procedure done on time.   Please understand that emergencies sometimes delay a scheduled   procedure.  No solid feeds after midnight on Tuesday. You may have clear liquids until 5 am on the day of your procedure.  On the morning of your procedure, take your medicines except for your HCTZ you may hold this.   You may take your morning medications with a sip of water on the day of your procedure.  Please take a baby aspirin (81 mg) on the morning of your procedure.   Medications to HOLD - HCTZ  Plan for a one night stay -- bring personal belongings.  Bring a current list of your medications and current insurance cards.  You MUST have a responsible person to drive you home. Someone MUST be with you the first 24 hours after you arrive home or your discharge will be delayed. Wear clothes that are easy to get on and off and wear slip on shoes.    Coronary Angiogram A coronary angiogram, also called coronary angiography, is an X-ray procedure used to look at the arteries in the heart. In this procedure, a dye (contrast dye) is injected through a long, hollow tube (catheter). The catheter is about the size of a piece of cooked spaghetti and is inserted through your groin, wrist, or arm. The dye is injected into each artery, and X-rays are then taken to  show if there is a blockage in the arteries of your heart.  LET [facility] CARE PROVIDER KNOW ABOUT: Any allergies you have, including allergies to shellfish or contrast dye.   All medicines you are taking, including vitamins, herbs, eye drops, creams, and over-the-counter medicines.   Previous problems you or members of your family have had with the use of anesthetics.   Any blood disorders you have.   Previous surgeries you have had. History of kidney problems or failure.   Other medical conditions you have.  RISKS AND COMPLICATIONS  Generally, a coronary angiogram is a safe procedure. However, about 1 person out of 1000 can have problems that may include: Allergic reaction to the dye. Bleeding/bruising from the access site or other locations. Kidney injury, especially in people with impaired kidney function.  Stroke (rare). Heart attack (rare). Irregular rhythms (rare) Death (rare)  BEFORE THE PROCEDURE  Do not eat or drink anything after midnight the night before the procedure or as directed by your health care provider.   Ask your health care provider about changing or stopping your regular medicines. This is especially important if you are taking diabetes medicines or blood thinners.  PROCEDURE You may be given a medicine to help you relax (sedative) before the procedure. This medicine is given through an intravenous (IV) access tube that is inserted into one of your veins.   The area  where the catheter will be inserted will be washed and shaved. This is usually done in the groin but may be done in the fold of your arm (near your elbow) or in the wrist.    A medicine will be given to numb the area where the catheter will be inserted (local anesthetic).   The health care provider will insert the catheter into an artery. The catheter will be guided by using a special type of X-ray (fluoroscopy) of the blood vessel being examined.   A special dye will then be injected into the  catheter, and X-rays will be taken. The dye will help to show where any narrowing or blockages are located in the heart arteries.     AFTER THE PROCEDURE  If the procedure is done through the leg, you will be kept in bed lying flat for several hours. You will be instructed to not bend or cross your legs. The insertion site will be checked frequently.   The pulse in your feet or wrist will be checked frequently.   Additional blood tests, X-rays, and an electrocardiogram may be done.          If you need a refill on your cardiac medications before your next appointment, please call your pharmacy.   Call the [place] office at [phone] if you have any questions, problems or concerns.

## 2018-05-26 NOTE — H&P (View-Only) (Signed)
CARDIOLOGY OFFICE NOTE  Date:  05/26/2018    Erica [name] Date of Birth: 1941/01/18 Medical Record #[id number]  PCP:  Mayra Neer, MD  Cardiologist:  Burt Knack  Chief Complaint  Patient presents with  . Coronary Artery Disease  . Chest Pain    Work in visit - seen for Dr. Burt Knack    History of Present Illness: Erica [name] is a 77 y.o. female who presents today for a work in visit. Seen for Dr. Burt Knack.   She has a history of CAD with remote CABG in 2011 with grafting of the LIMA to LAD, saphenous vein graft to PDA and PLA, and saphenous vein graft to OM - per Dr. Cyndia Bent.  Her other issues include carotid disease, HLD and PAD - followed by Dr. Fletcher Anon.   Last Myoview was in January of 2018 - this demonstrated normal perfusion and normal LV function.   Last seen by Dr. Burt Knack in December - some claudication. Had had 2 episodes of chest pain - used NTG with prompt relief - this was at rest - she felt it was more related to indigestion. Saw Dr. Fletcher Anon in January of 2019. She opted for continued medical therapy at that time.   Comes in today. Here alone. 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 She notes increasing frequency. Occurs with and without exertion - has had with watching TV as well as with walking. She will use NTG sometimes but not all the time. She notes more claudication as well and is seeing Dr. Fletcher Anon again next month for a recheck. She is chronically bradycardic - not symptomatic - denies being dizzy or lightheaded. No syncope reported.   Past Medical History:  Diagnosis Date  . Anxiety   . Coronary artery disease    status post multiple percutaneous interventions  . Hyperlipidemia   . Hypertension   . Hypothyroidism   . Kidney calculus 2016  . Osteoarthritis     Past Surgical History:  Procedure Laterality Date  . ANKLE SURGERY    .  CARDIAC CATHETERIZATION  12-2008  . CORONARY ARTERY BYPASS GRAFT  01-25-10   x4  . CORONARY STENT PLACEMENT  2006    right coronary artery with drug-eluting stent  . [place]    bare-metal stent to the right coronary artery  . CORONARY STENT PLACEMENT  2008   drug-eluting stent placed in the left circumflex  . CYSTOSCOPY WITH RETROGRADE PYELOGRAM, URETEROSCOPY AND STENT PLACEMENT Right 10/18/2015   Procedure: CYSTOSCOPY WITH RETROGRADE PYELOGRAM,  AND STENT PLACEMENT;  Surgeon: Festus Aloe, MD;  Location: [facility];  Service: Urology;  Laterality: Right;  . CYSTOSCOPY/URETEROSCOPY/HOLMIUM LASER/STENT PLACEMENT Right 11/29/2015   Procedure: RIGHT URETEROSCOPY/RIGHT RETROGRADE PYELOGRAM/ RIGHT STENT REPLACEMENT;  Surgeon: Festus Aloe, MD;  Location: [facility];  Service: Urology;  Laterality: Right;     Medications: Current Meds  Medication Sig  . aspirin 81 MG tablet Take 1 tablet (81 mg total) by mouth daily.  Marland Kitchen atenolol (TENORMIN) 25 MG tablet Take 25 mg by mouth daily.  Marland Kitchen atorvastatin (LIPITOR) 80 MG tablet Take 80 mg by mouth daily.  . hydrochlorothiazide (HYDRODIURIL) 25 MG tablet Take 25 mg by mouth daily.  Marland Kitchen ibuprofen (ADVIL,MOTRIN) 200 MG tablet Take 400 mg by mouth every 6 (six) hours as needed for fever, headache or moderate pain.  Marland Kitchen levothyroxine (  SYNTHROID, LEVOTHROID) 100 MCG tablet Take 100 mcg by mouth daily.  . nitroGLYCERIN (NITROSTAT) 0.4 MG SL tablet Place 1 tablet (0.4 mg total) under the tongue every 5 (five) minutes as needed for chest pain.  Marland Kitchen sertraline (ZOLOFT) 50 MG tablet Take 50 mg by mouth daily.      Allergies: Allergies  Allergen Reactions  . Morphine And Related Other (See Comments)    Sick on stomach    Social History: The patient  reports that she quit smoking about 21 years ago. She has never used smokeless tobacco. She reports that she does not drink alcohol or use drugs.   Family History: The  patient's family history includes Other in her unknown relative; Other (age of onset: 66) in her mother.   Review of Systems: Please see the history of present illness.   Otherwise, the review of systems is positive for none.   All other systems are reviewed and negative.   Physical Exam: VS:  BP 138/78   Pulse (!) 49   Ht 5\' 1"  (1.549 m)   Wt 126 lb 12.8 oz (57.5 kg)   SpO2 97%   BMI 23.96 kg/m  .  BMI Body mass index is 23.96 kg/m.  Wt Readings from Last 3 Encounters:  05/26/18 126 lb 12.8 oz (57.5 kg)  12/10/17 129 lb (58.5 kg)  11/14/17 130 lb 1.9 oz (59 kg)    General: Pleasant. Well developed, well nourished and in no acute distress.   HEENT: Normal.  Neck: Supple, no JVD, carotid bruits, or masses noted.  Cardiac: Regular rate and rhythm. No murmurs, rubs, or gallops. No edema.  Respiratory:  Lungs are clear to auscultation bilaterally with normal work of breathing.  GI: Soft and nontender.  MS: No deformity or atrophy. Gait and ROM intact.  Skin: Warm and dry. Color is normal.  Neuro:  Strength and sensation are intact and no gross focal deficits noted.  Psych: Alert, appropriate and with normal affect.   LABORATORY DATA:  EKG:  EKG is ordered today. This demonstrates sinus bradycardia - nonspecific changes -reviewed with Dr. Burt Knack.  Lab Results  Component Value Date   WBC 9.1 10/21/2015   HGB 12.6 11/29/2015   HCT 37.0 11/29/2015   PLT 351 10/21/2015   GLUCOSE 96 12/02/2017   CHOL  01/09/2010    131        ATP III CLASSIFICATION:  <200     mg/dL   Desirable  200-239  mg/dL   Borderline High  >=240    mg/dL   High          TRIG 87 01/09/2010   HDL 48 01/09/2010   LDLCALC  01/09/2010    66        Total Cholesterol/HDL:CHD Risk Coronary Heart Disease Risk Table                     Men   Women  1/2 Average Risk   3.4   3.3  Average Risk       5.0   4.4  2 X Average Risk   9.6   7.1  3 X Average Risk  23.4   11.0        Use the calculated Patient  Ratio above and the CHD Risk Table to determine the patient's CHD Risk.        ATP III CLASSIFICATION (LDL):  <100     mg/dL   Optimal  100-129  mg/dL  Near or Above                    Optimal  130-159  mg/dL   Borderline  160-189  mg/dL   High  >190     mg/dL   Very High   ALT 33 10/19/2015   AST 33 10/19/2015   NA 145 (H) 12/02/2017   K 4.2 12/02/2017   CL 105 12/02/2017   CREATININE 1.15 (H) 12/02/2017   BUN 24 12/02/2017   CO2 24 12/02/2017   TSH 1.488 Test methodology is 3rd generation TSH 01/08/2010   INR 1.33 10/18/2015   HGBA1C (H) 01/23/2010    6.2 (NOTE) The ADA recommends the following therapeutic goal for glycemic control related to Hgb A1c measurement: Goal of therapy: <6.5 Hgb A1c  Reference: American Diabetes Association: Clinical Practice Recommendations 2010, Diabetes Care, 2010, 33: (Suppl  1).       BNP (last 3 results) No results for input(s): BNP in the last 8760 hours.  ProBNP (last 3 results) No results for input(s): PROBNP in the last 8760 hours.   Other Studies Reviewed Today:  Lexiscan Myoview 12-04-2016: Study Highlights     Nuclear stress EF: 60%.  There was no ST segment deviation noted during stress.  The study is normal.  The left ventricular ejection fraction is normal (55-65%).  Normal study, no evidence for ischemia or infarction.    Carotid duplex scan 12/07/2016: 40-59% bilateral ICA stenosis.  ASSESSMENT AND PLAN:  1. CAD with prior CABG in 2011 - stable Myoview from 2018 - now with recurring spells of chest pain - discussed with Dr. Burt Knack - will plan to proceed with repeat cardiac cath - The patient understands that risks include but are not limited to stroke (1 in 1000), death (1 in 1000), kidney failure [usually temporary] (1 in 500), bleeding (1 in 200), allergic reaction [possibly serious] (1 in 200), and agrees to proceed. Scheduled for next Wednesday with Dr. Burt Knack. She is to limit her activities. Use NTG as  needed. Lab today.   2. HTN - BP ok on current regimen. No changes made today.   3. HLD - lipids from January noted - LDL not at goal - may need to consider adding Zetia or PCSK9 therapy  4. PAD - has carotid disease as well as prior arteriogram showing high-grade stenosis of the mid left common femoral artery and moderate stenosis affecting the left popliteal artery with three-vessel runoff below the knee.  On the right, there was moderate stenosis affecting the right common iliac artery and moderate stenosis affecting the popliteal artery.  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 She also endorses more claudication - seeing Dr. Fletcher Anon back next month for discussion.   Current medicines are reviewed with the patient today.  The patient does not have concerns regarding medicines other than what has been noted above.  The following changes have been made:  See above.  Labs/ tests ordered today include:    Orders Placed This Encounter  Procedures  . Comprehensive metabolic panel  . CBC     Disposition:   Further disposition pending.    Patient is agreeable to this plan and will call if any problems develop in the interim.   SignedTruitt Merle, NP  05/26/2018 11:04 AM  [place] [address] [place], [place]  [address] Phone: [phone] Fax: [phone]

## 2018-05-27 NOTE — Addendum Note (Signed)
Addended by: Gaetano Net on: 05/27/2018 04:33 PM   Modules accepted: Orders

## 2018-06-02 ENCOUNTER — Telehealth: Payer: Self-pay | Admitting: *Deleted

## 2018-06-02 NOTE — Telephone Encounter (Addendum)
Catheterization scheduled at [facility] for: Wednesday June 04, 2018 1 PM Verified arrival time and place: [place] Entrance A at: 11 AM  No solid food after midnight prior to cath, clear liquids until 5 AM day of procedure. Verify allergies in Epic Verify no diabetes medications.  Hold: HCTZ AM of procedure.  AM meds can be  taken pre-cath with sip of water including: ASA 81 mg   Confirm patient has responsible person to drive home post procedure and observe patient for 24 hours   Discussed instructions with patient, she verbalized understanding, thanked me for call.

## 2018-06-03 DIAGNOSIS — M25552 Pain in left hip: Secondary | ICD-10-CM | POA: Diagnosis not present

## 2018-06-03 NOTE — Telephone Encounter (Signed)
Voicemail.

## 2018-06-04 ENCOUNTER — Encounter (HOSPITAL_COMMUNITY): Payer: Self-pay | Admitting: General Practice

## 2018-06-04 ENCOUNTER — Ambulatory Visit (HOSPITAL_COMMUNITY)
Admission: RE | Disposition: A | Discharge status: Home / Self Care | Payer: Self-pay | Source: Ambulatory Visit | Attending: Cardiovascular Disease

## 2018-06-04 ENCOUNTER — Other Ambulatory Visit: Payer: Self-pay

## 2018-06-04 ENCOUNTER — Ambulatory Visit (HOSPITAL_COMMUNITY)
Admission: RE | Admit: 2018-06-04 | Discharge: 2018-06-05 | Disposition: A | Discharge status: Home / Self Care | Payer: Medicare HMO | Source: Ambulatory Visit | Attending: Cardiovascular Disease | Admitting: Cardiovascular Disease

## 2018-06-04 DIAGNOSIS — Y831 Surgical operation with implant of artificial internal device as the cause of abnormal reaction of the patient, or of later complication, without mention of misadventure at the time of the procedure: Secondary | ICD-10-CM | POA: Diagnosis not present

## 2018-06-04 DIAGNOSIS — I10 Essential (primary) hypertension: Secondary | ICD-10-CM | POA: Insufficient documentation

## 2018-06-04 DIAGNOSIS — I25810 Atherosclerosis of coronary artery bypass graft(s) without angina pectoris: Secondary | ICD-10-CM | POA: Diagnosis not present

## 2018-06-04 DIAGNOSIS — Z7902 Long term (current) use of antithrombotics/antiplatelets: Secondary | ICD-10-CM | POA: Insufficient documentation

## 2018-06-04 DIAGNOSIS — I2584 Coronary atherosclerosis due to calcified coronary lesion: Secondary | ICD-10-CM | POA: Diagnosis not present

## 2018-06-04 DIAGNOSIS — Z9889 Other specified postprocedural states: Secondary | ICD-10-CM | POA: Insufficient documentation

## 2018-06-04 DIAGNOSIS — T82858A Stenosis of vascular prosthetic devices, implants and grafts, initial encounter: Secondary | ICD-10-CM | POA: Insufficient documentation

## 2018-06-04 DIAGNOSIS — F419 Anxiety disorder, unspecified: Secondary | ICD-10-CM | POA: Insufficient documentation

## 2018-06-04 DIAGNOSIS — M1990 Unspecified osteoarthritis, unspecified site: Secondary | ICD-10-CM | POA: Insufficient documentation

## 2018-06-04 DIAGNOSIS — Z960 Presence of urogenital implants: Secondary | ICD-10-CM | POA: Insufficient documentation

## 2018-06-04 DIAGNOSIS — Z79890 Hormone replacement therapy (postmenopausal): Secondary | ICD-10-CM | POA: Diagnosis not present

## 2018-06-04 DIAGNOSIS — I252 Old myocardial infarction: Secondary | ICD-10-CM | POA: Insufficient documentation

## 2018-06-04 DIAGNOSIS — Z7982 Long term (current) use of aspirin: Secondary | ICD-10-CM | POA: Diagnosis not present

## 2018-06-04 DIAGNOSIS — Z87442 Personal history of urinary calculi: Secondary | ICD-10-CM | POA: Insufficient documentation

## 2018-06-04 DIAGNOSIS — I2582 Chronic total occlusion of coronary artery: Secondary | ICD-10-CM | POA: Insufficient documentation

## 2018-06-04 DIAGNOSIS — I25119 Atherosclerotic heart disease of native coronary artery with unspecified angina pectoris: Secondary | ICD-10-CM | POA: Diagnosis present

## 2018-06-04 DIAGNOSIS — E785 Hyperlipidemia, unspecified: Secondary | ICD-10-CM | POA: Insufficient documentation

## 2018-06-04 DIAGNOSIS — Z955 Presence of coronary angioplasty implant and graft: Secondary | ICD-10-CM | POA: Diagnosis not present

## 2018-06-04 DIAGNOSIS — Z885 Allergy status to narcotic agent status: Secondary | ICD-10-CM | POA: Insufficient documentation

## 2018-06-04 DIAGNOSIS — Z79899 Other long term (current) drug therapy: Secondary | ICD-10-CM | POA: Diagnosis not present

## 2018-06-04 DIAGNOSIS — Z87891 Personal history of nicotine dependence: Secondary | ICD-10-CM | POA: Insufficient documentation

## 2018-06-04 DIAGNOSIS — E039 Hypothyroidism, unspecified: Secondary | ICD-10-CM | POA: Diagnosis not present

## 2018-06-04 HISTORY — DX: Pain in thoracic spine: M54.6

## 2018-06-04 HISTORY — PX: ULTRASOUND GUIDANCE FOR VASCULAR ACCESS: SHX6516

## 2018-06-04 HISTORY — PX: LEFT HEART CATH AND CORS/GRAFTS ANGIOGRAPHY: CATH118250

## 2018-06-04 HISTORY — DX: Dorsalgia, unspecified: M54.9

## 2018-06-04 HISTORY — DX: Acute myocardial infarction, unspecified: I21.9

## 2018-06-04 HISTORY — DX: Other chronic pain: G89.29

## 2018-06-04 HISTORY — PX: INTRAVASCULAR PRESSURE WIRE/FFR STUDY: CATH118243

## 2018-06-04 HISTORY — PX: CORONARY BALLOON ANGIOPLASTY: CATH118233

## 2018-06-04 HISTORY — DX: Depression, unspecified: F32.A

## 2018-06-04 HISTORY — DX: Major depressive disorder, single episode, unspecified: F32.9

## 2018-06-04 HISTORY — DX: Personal history of urinary calculi: Z87.442

## 2018-06-04 LAB — POCT ACTIVATED CLOTTING TIME: Activated Clotting Time: 290 seconds

## 2018-06-04 SURGERY — LEFT HEART CATH AND CORS/GRAFTS ANGIOGRAPHY
Anesthesia: LOCAL

## 2018-06-04 MED ORDER — CLOPIDOGREL BISULFATE 300 MG PO TABS
ORAL_TABLET | ORAL | Status: AC
  Filled 2018-06-04: qty 2

## 2018-06-04 MED ORDER — FENTANYL CITRATE (PF) 100 MCG/2ML IJ SOLN
INTRAMUSCULAR | Status: AC
  Filled 2018-06-04: qty 2

## 2018-06-04 MED ORDER — IOPAMIDOL (ISOVUE-370) INJECTION 76%
INTRAVENOUS | Status: DC | PRN
  Administered 2018-06-04: 95 mL via INTRA_ARTERIAL

## 2018-06-04 MED ORDER — ASPIRIN 81 MG PO CHEW: 81.0000 mg | CHEWABLE_TABLET | ORAL | Status: DC

## 2018-06-04 MED ORDER — HEPARIN (PORCINE) IN NACL 2-0.9 UNITS/ML
INTRAMUSCULAR | Status: AC | PRN
  Administered 2018-06-04 (×2): 500 mL

## 2018-06-04 MED ORDER — ADENOSINE 12 MG/4ML IV SOLN
INTRAVENOUS | Status: AC
  Filled 2018-06-04: qty 16

## 2018-06-04 MED ORDER — SODIUM CHLORIDE 0.9 % IV SOLN
INTRAVENOUS | Status: DC | PRN
  Administered 2018-06-04: 1.75 mg/kg/h via INTRAVENOUS

## 2018-06-04 MED ORDER — ACETAMINOPHEN 325 MG PO TABS: 650.0000 mg | ORAL_TABLET | ORAL | Status: DC | PRN

## 2018-06-04 MED ORDER — CLOPIDOGREL BISULFATE 75 MG PO TABS: 75.0000 mg | ORAL_TABLET | Freq: Every day | ORAL | Status: DC

## 2018-06-04 MED ORDER — HEPARIN (PORCINE) IN NACL 1000-0.9 UT/500ML-% IV SOLN
INTRAVENOUS | Status: AC
  Filled 2018-06-04: qty 1000

## 2018-06-04 MED ORDER — DIAZEPAM 5 MG PO TABS
ORAL_TABLET | ORAL | Status: AC
  Administered 2018-06-04: 10 mg via ORAL
  Filled 2018-06-04: qty 2

## 2018-06-04 MED ORDER — IOPAMIDOL (ISOVUE-370) INJECTION 76%
INTRAVENOUS | Status: AC
  Filled 2018-06-04: qty 50

## 2018-06-04 MED ORDER — NITROGLYCERIN 1 MG/10 ML FOR IR/CATH LAB
INTRA_ARTERIAL | Status: DC | PRN
  Administered 2018-06-04: 150 ug via INTRACORONARY

## 2018-06-04 MED ORDER — LIDOCAINE HCL (PF) 1 % IJ SOLN
INTRAMUSCULAR | Status: AC
  Filled 2018-06-04: qty 30

## 2018-06-04 MED ORDER — ATORVASTATIN CALCIUM 80 MG PO TABS
80.0000 mg | ORAL_TABLET | Freq: Every day | ORAL | Status: DC
  Administered 2018-06-05: 09:00:00 80 mg via ORAL
  Filled 2018-06-04: qty 1

## 2018-06-04 MED ORDER — CLOPIDOGREL BISULFATE 300 MG PO TABS
ORAL_TABLET | ORAL | Status: DC | PRN
  Administered 2018-06-04: 600 mg via ORAL

## 2018-06-04 MED ORDER — NITROGLYCERIN 1 MG/10 ML FOR IR/CATH LAB
INTRA_ARTERIAL | Status: AC
  Filled 2018-06-04: qty 10

## 2018-06-04 MED ORDER — ATENOLOL 25 MG PO TABS
25.0000 mg | ORAL_TABLET | Freq: Every day | ORAL | Status: DC
  Administered 2018-06-05: 12.5 mg via ORAL
  Filled 2018-06-04: qty 1

## 2018-06-04 MED ORDER — NITROGLYCERIN 0.4 MG SL SUBL: 0.4000 mg | SUBLINGUAL_TABLET | SUBLINGUAL | Status: DC | PRN

## 2018-06-04 MED ORDER — BIVALIRUDIN BOLUS VIA INFUSION - CUPID
INTRAVENOUS | Status: DC | PRN
  Administered 2018-06-04: 42.9 mg via INTRAVENOUS

## 2018-06-04 MED ORDER — ASPIRIN EC 81 MG PO TBEC
81.0000 mg | DELAYED_RELEASE_TABLET | Freq: Every day | ORAL | Status: DC
  Administered 2018-06-05: 09:00:00 81 mg via ORAL
  Filled 2018-06-04: qty 1

## 2018-06-04 MED ORDER — HEPARIN SODIUM (PORCINE) 1000 UNIT/ML IJ SOLN
INTRAMUSCULAR | Status: AC
  Filled 2018-06-04: qty 1

## 2018-06-04 MED ORDER — ANGIOPLASTY BOOK
Freq: Once | Status: AC
  Administered 2018-06-05: 02:00:00
  Filled 2018-06-04: qty 1

## 2018-06-04 MED ORDER — LEVOTHYROXINE SODIUM 100 MCG PO TABS
100.0000 ug | ORAL_TABLET | Freq: Every day | ORAL | Status: DC
  Administered 2018-06-05: 100 ug via ORAL
  Filled 2018-06-04: qty 1

## 2018-06-04 MED ORDER — SODIUM CHLORIDE 0.9 % IV SOLN
INTRAVENOUS | Status: DC
  Administered 2018-06-04: 12:00:00 via INTRAVENOUS

## 2018-06-04 MED ORDER — MIDAZOLAM HCL 2 MG/2ML IJ SOLN
INTRAMUSCULAR | Status: DC | PRN
  Administered 2018-06-04: 2 mg via INTRAVENOUS
  Administered 2018-06-04: 1 mg via INTRAVENOUS

## 2018-06-04 MED ORDER — SODIUM CHLORIDE 0.9 % WEIGHT BASED INFUSION: 1.0000 mL/kg/h | INTRAVENOUS | Status: AC

## 2018-06-04 MED ORDER — SERTRALINE HCL 50 MG PO TABS
50.0000 mg | ORAL_TABLET | Freq: Every day | ORAL | Status: DC
  Administered 2018-06-05: 09:00:00 50 mg via ORAL
  Filled 2018-06-04: qty 1

## 2018-06-04 MED ORDER — SODIUM CHLORIDE 0.9% FLUSH: 3.0000 mL | INTRAVENOUS | Status: DC | PRN

## 2018-06-04 MED ORDER — SODIUM CHLORIDE 0.9% FLUSH
3.0000 mL | Freq: Two times a day (BID) | INTRAVENOUS | Status: DC
  Administered 2018-06-05: 05:00:00 3 mL via INTRAVENOUS

## 2018-06-04 MED ORDER — LABETALOL HCL 5 MG/ML IV SOLN: 10.0000 mg | INTRAVENOUS | Status: AC | PRN

## 2018-06-04 MED ORDER — LIDOCAINE HCL (PF) 1 % IJ SOLN
INTRAMUSCULAR | Status: DC | PRN
  Administered 2018-06-04: 2 mL
  Administered 2018-06-04: 13 mL

## 2018-06-04 MED ORDER — VERAPAMIL HCL 2.5 MG/ML IV SOLN
INTRAVENOUS | Status: AC
  Filled 2018-06-04: qty 2

## 2018-06-04 MED ORDER — SODIUM CHLORIDE 0.9 % IV SOLN: 250.0000 mL | INTRAVENOUS | Status: DC | PRN

## 2018-06-04 MED ORDER — FENTANYL CITRATE (PF) 100 MCG/2ML IJ SOLN
INTRAMUSCULAR | Status: DC | PRN
  Administered 2018-06-04 (×2): 25 ug via INTRAVENOUS

## 2018-06-04 MED ORDER — MIDAZOLAM HCL 2 MG/2ML IJ SOLN
INTRAMUSCULAR | Status: AC
  Filled 2018-06-04: qty 2

## 2018-06-04 MED ORDER — CLOPIDOGREL BISULFATE 75 MG PO TABS
75.0000 mg | ORAL_TABLET | Freq: Every day | ORAL | Status: DC
  Administered 2018-06-05: 09:00:00 75 mg via ORAL
  Filled 2018-06-04: qty 1

## 2018-06-04 MED ORDER — COLESEVELAM HCL 625 MG PO TABS
1250.0000 mg | ORAL_TABLET | Freq: Two times a day (BID) | ORAL | Status: DC
  Administered 2018-06-05: 09:00:00 1250 mg via ORAL
  Filled 2018-06-04: qty 2

## 2018-06-04 MED ORDER — HYDRALAZINE HCL 20 MG/ML IJ SOLN: 5.0000 mg | INTRAMUSCULAR | Status: AC | PRN

## 2018-06-04 MED ORDER — IOPAMIDOL (ISOVUE-370) INJECTION 76%
INTRAVENOUS | Status: AC
  Filled 2018-06-04: qty 100

## 2018-06-04 MED ORDER — ADENOSINE (DIAGNOSTIC) 140MCG/KG/MIN
INTRAVENOUS | Status: DC | PRN
  Administered 2018-06-04: 140 ug/kg/min via INTRAVENOUS

## 2018-06-04 MED ORDER — ONDANSETRON HCL 4 MG/2ML IJ SOLN: 4.0000 mg | Freq: Four times a day (QID) | INTRAMUSCULAR | Status: DC | PRN

## 2018-06-04 MED ORDER — DIAZEPAM 5 MG PO TABS
10.0000 mg | ORAL_TABLET | ORAL | Status: AC
  Administered 2018-06-04: 10 mg via ORAL

## 2018-06-04 MED ORDER — IBUPROFEN 400 MG PO TABS
400.0000 mg | ORAL_TABLET | Freq: Four times a day (QID) | ORAL | Status: DC | PRN
  Filled 2018-06-04: qty 1

## 2018-06-04 MED ORDER — BIVALIRUDIN TRIFLUOROACETATE 250 MG IV SOLR
INTRAVENOUS | Status: AC
  Filled 2018-06-04: qty 250

## 2018-06-04 MED ORDER — HYDROCHLOROTHIAZIDE 25 MG PO TABS
25.0000 mg | ORAL_TABLET | Freq: Every day | ORAL | Status: DC
  Administered 2018-06-05: 25 mg via ORAL
  Filled 2018-06-04: qty 1

## 2018-06-04 SURGICAL SUPPLY — 22 items
BALLN WOLVERINE 2.50X10 (BALLOONS) ×3
BALLOON WOLVERINE 2.50X10 (BALLOONS) IMPLANT
CATH INFINITI 5FR MULTPACK ANG (CATHETERS) ×1 IMPLANT
CATH VISTA GUIDE 6FR AL1 (CATHETERS) ×1 IMPLANT
CATH VISTA GUIDE 6FR XBLAD3.5 (CATHETERS) ×1 IMPLANT
COVER PRB 48X5XTLSCP FOLD TPE (BAG) IMPLANT
COVER PROBE 5X48 (BAG) ×3
DEVICE CLOSURE PERCLS PRGLD 6F (VASCULAR PRODUCTS) IMPLANT
GLIDESHEATH SLEND SS 6F .021 (SHEATH) ×1 IMPLANT
GUIDEWIRE INQWIRE 1.5J.035X260 (WIRE) IMPLANT
GUIDEWIRE PRESSURE COMET II (WIRE) ×1 IMPLANT
INQWIRE 1.5J .035X260CM (WIRE) ×3
KIT ENCORE 26 ADVANTAGE (KITS) ×1 IMPLANT
KIT HEART LEFT (KITS) ×3 IMPLANT
PACK CARDIAC CATHETERIZATION (CUSTOM PROCEDURE TRAY) ×3 IMPLANT
PERCLOSE PROGLIDE 6F (VASCULAR PRODUCTS) ×3
RESERVOIR CO2 (VASCULAR PRODUCTS) IMPLANT
SHEATH PINNACLE 5F 10CM (SHEATH) ×1 IMPLANT
SHEATH PINNACLE 6F 10CM (SHEATH) ×1 IMPLANT
TRANSDUCER W/STOPCOCK (MISCELLANEOUS) ×3 IMPLANT
TUBING CIL FLEX 10 FLL-RA (TUBING) ×3 IMPLANT
WIRE EMERALD 3MM-J .035X150CM (WIRE) ×1 IMPLANT

## 2018-06-04 NOTE — Interval H&P Note (Signed)
Cath Lab Visit (complete for each Cath Lab visit)  Clinical Evaluation Leading to the Procedure:   ACS: No.  Non-ACS:    Anginal Classification: CCS III  Anti-ischemic medical therapy: Minimal Therapy (1 class of medications)  Non-Invasive Test Results: No non-invasive testing performed  Prior CABG: Previous CABG      History and Physical Interval Note:  06/04/2018 4:20 PM  Erica [name]  has presented today for surgery, with the diagnosis of cp  The various methods of treatment have been discussed with the patient and family. After consideration of risks, benefits and other options for treatment, the patient has consented to  Procedure(s): LEFT HEART CATH AND CORS/GRAFTS ANGIOGRAPHY (N/A) as a surgical intervention .  The patient's history has been reviewed, patient examined, no change in status, stable for surgery.  I have reviewed the patient's chart and labs.  Questions were answered to the patient's satisfaction.     [name]

## 2018-06-05 DIAGNOSIS — I25810 Atherosclerosis of coronary artery bypass graft(s) without angina pectoris: Secondary | ICD-10-CM | POA: Diagnosis not present

## 2018-06-05 DIAGNOSIS — I10 Essential (primary) hypertension: Secondary | ICD-10-CM | POA: Diagnosis not present

## 2018-06-05 DIAGNOSIS — E039 Hypothyroidism, unspecified: Secondary | ICD-10-CM | POA: Diagnosis not present

## 2018-06-05 DIAGNOSIS — E785 Hyperlipidemia, unspecified: Secondary | ICD-10-CM | POA: Diagnosis not present

## 2018-06-05 DIAGNOSIS — Z87891 Personal history of nicotine dependence: Secondary | ICD-10-CM | POA: Diagnosis not present

## 2018-06-05 DIAGNOSIS — T82858A Stenosis of vascular prosthetic devices, implants and grafts, initial encounter: Secondary | ICD-10-CM | POA: Diagnosis not present

## 2018-06-05 DIAGNOSIS — I2582 Chronic total occlusion of coronary artery: Secondary | ICD-10-CM | POA: Diagnosis not present

## 2018-06-05 DIAGNOSIS — I2584 Coronary atherosclerosis due to calcified coronary lesion: Secondary | ICD-10-CM | POA: Diagnosis not present

## 2018-06-05 DIAGNOSIS — I25119 Atherosclerotic heart disease of native coronary artery with unspecified angina pectoris: Secondary | ICD-10-CM | POA: Diagnosis not present

## 2018-06-05 LAB — CBC
HEMATOCRIT: 38 % (ref 36.0–46.0)
Hemoglobin: 12.6 g/dL (ref 12.0–15.0)
MCH: 29.9 pg (ref 26.0–34.0)
MCHC: 33.2 g/dL (ref 30.0–36.0)
MCV: 90 fL (ref 78.0–100.0)
PLATELETS: 197 10*3/uL (ref 150–400)
RBC: 4.22 MIL/uL (ref 3.87–5.11)
RDW: 13 % (ref 11.5–15.5)
WBC: 5.8 10*3/uL (ref 4.0–10.5)

## 2018-06-05 LAB — BASIC METABOLIC PANEL
Anion gap: 4 — ABNORMAL LOW (ref 5–15)
BUN: 16 mg/dL (ref 8–23)
CHLORIDE: 109 mmol/L (ref 98–111)
CO2: 28 mmol/L (ref 22–32)
CREATININE: 0.85 mg/dL (ref 0.44–1.00)
Calcium: 8.4 mg/dL — ABNORMAL LOW (ref 8.9–10.3)
GFR calc Af Amer: >60 mL/min (ref >60)
GFR calc non Af Amer: >60 mL/min (ref >60)
Glucose, Bld: 97 mg/dL (ref 70–99)
POTASSIUM: 3.6 mmol/L (ref 3.5–5.1)
Sodium: 141 mmol/L (ref 135–145)

## 2018-06-05 MED ORDER — ATENOLOL 25 MG PO TABS: 12.5000 mg | ORAL_TABLET | Freq: Every day | ORAL | 2 refills | Status: DC

## 2018-06-05 MED ORDER — CLOPIDOGREL BISULFATE 75 MG PO TABS: 75.0000 mg | ORAL_TABLET | Freq: Every day | ORAL | 2 refills | Status: DC

## 2018-06-05 NOTE — Discharge Summary (Signed)
Discharge Summary    Patient ID: Erica [name],  MRN: [id number], DOB/AGE: Aug 21, 1941 77 y.o.  Admit date: 06/04/2018 Discharge date: 06/05/2018  Primary Care Provider: Mayra Neer Primary Cardiologist: Dr. Burt Knack   Discharge Diagnoses    Active Problems:   Coronary artery disease involving native coronary artery with angina pectoris [facility])   Coronary artery disease involving native coronary artery of native heart with angina pectoris (HCC)   Allergies Allergies  Allergen Reactions  . Morphine And Related Other (See Comments)    Sick on stomach    Diagnostic Studies/Procedures    Cath: 06/04/18  Conclusion     Ost Cx to Prox Cx lesion is 100% stenosed.  SVG and is normal in caliber.  LIMA.  Ost LAD to Prox LAD lesion is 75% stenosed.  Mid RCA lesion is 75% stenosed.  Scoring balloon angioplasty was performed using a BALLOON WOLVERINE 2.50X10.  Post intervention, there is a 10% residual stenosis.  Ost RCA to Prox RCA lesion is 40% stenosed.  Seq SVG-.  Origin lesion before RPDA is 100% stenosed.  The left ventricular systolic function is normal.  LV end diastolic pressure is normal.   1. Severe multivessel CAD with total occlusion of the LCx, severe in-stent restenosis in the RCA, and moderately severe proximal LAD stenosis 2. 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 Mild segmental LV contraction abnormality with preserved LVEF of 55% 4. FFR of the proximal LAD = 0.77 5. FFR of the mid-RCA = 0.75, treated successfully with cutting balloon angioplasty  Recommend: Staged PCI of the LAD - will require orbital atherectomy and stenting Should be OK for DC home tomorrow am and will schedule for outpatient PCI of the LAD with atherectomy  Recommend uninterrupted dual antiplatelet therapy with Aspirin 81mg  daily and Clopidogrel 75mg  daily for a minimum of 6 months (stable ischemic  heart disease - Class I recommendation).   _____________   History of Present Illness      77 y.o. female who presented to the office on 05/26/18 for a work in visit by Truitt Merle. Seen for Dr. Burt Knack.   She has a history of CAD with remote CABG in 2011 with grafting of the LIMA to LAD, saphenous vein graft to PDA and PLA, and saphenous vein graft to OM - per Dr. Cyndia Bent. Her other issues include carotid disease, HLD and PAD - followed by Dr. Fletcher Anon.   Last Myoview was in January of 2018 - this demonstrated normal perfusion and normal LV function.   Last seen by Dr. Burt Knack in December - some claudication. Had had 2 episodes of chest pain - used NTG with prompt relief - this was at rest - she felt it was more related to indigestion. Saw Dr. Fletcher Anon in January of 2019. She opted for continued medical therapy at that time.   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 She noted increasing frequency. Occured with and without exertion - had with watching TV as well as with walking. She would use NTG sometimes but not all the time. She noted more claudication as well and planned to see Dr. Fletcher Anon again next month for a recheck. She is chronically bradycardic - not symptomatic - denies being dizzy or lightheaded. No syncope reported.   She had a stable  Myoview in 2018.  Given her recurrence of chest pain her case was discussed with Dr. Burt Knack and she was set up for repeat outpatient cardiac catheterization.  Hospital Course     Underwent cardiac catheterization noted above with successful PCI of the RCA without complication.  She will be continued on Dapto with aspirin and Plavix.  She does have a residual lesion in the proximal LAD that will require orbital atherectomy.  This will be arranged to be addressed with follow-up outpatient cath.  She was noted to be bradycardic on  telemetry, and he atenolol was reduced to 12.5 mg daily. She is able to ambulate the hallways without recurrent chest pain.  Post cath labs are stable.  [name] was seen by Dr. Burt Knack and determined stable for discharge home. Follow up in the office has been arranged. Medications are listed below.   _____________  Discharge Vitals Blood pressure (!) 145/42, pulse (!) 50, temperature 97.9 F (36.6 C), temperature source Oral, resp. rate 11, height 5\' 2"  (1.575 m), weight 129 lb 11.2 oz (58.8 kg), SpO2 97 %.  Filed Weights   06/04/18 1115 06/05/18 0400  Weight: 126 lb (57.2 kg) 129 lb 11.2 oz (58.8 kg)    Labs & Radiologic Studies    CBC Recent Labs    06/05/18 0333  WBC 5.8  HGB 12.6  HCT 38.0  MCV 90.0  PLT 017   Basic Metabolic Panel Recent Labs    06/05/18 0333  NA 141  K 3.6  CL 109  CO2 28  GLUCOSE 97  BUN 16  CREATININE 0.85  CALCIUM 8.4*   Liver Function Tests No results for input(s): AST, ALT, ALKPHOS, BILITOT, PROT, ALBUMIN in the last 72 hours. No results for input(s): LIPASE, AMYLASE in the last 72 hours. Cardiac Enzymes No results for input(s): CKTOTAL, CKMB, CKMBINDEX, TROPONINI in the last 72 hours. BNP Invalid input(s): POCBNP D-Dimer No results for input(s): DDIMER in the last 72 hours. Hemoglobin A1C No results for input(s): HGBA1C in the last 72 hours. Fasting Lipid Panel No results for input(s): CHOL, HDL, LDLCALC, TRIG, CHOLHDL, LDLDIRECT in the last 72 hours. Thyroid Function Tests No results for input(s): TSH, T4TOTAL, T3FREE, THYROIDAB in the last 72 hours.  Invalid input(s): FREET3 _____________  No results found. Disposition   Pt is being discharged home today in good condition.  Follow-up Plans & Appointments    Follow-up Information    [name], MD Follow up.   Specialty:  Cardiology Why:  Office will call you with a follow up appt within the next 24 hours. please give [place] a call if you do not receive a call  for an appt within that time. Contact information: [address] [place] [address] [phone]          Discharge Instructions    Call MD for:  redness, tenderness, or signs of infection (pain, swelling, redness, odor or green/yellow discharge around incision site)   Complete by:  As directed    Diet - low sodium heart healthy   Complete by:  As directed    Discharge instructions   Complete by:  As directed    Groin Site Care Refer to this sheet in the next few weeks. These instructions provide you with information on caring for yourself after your procedure. Your caregiver may also give you more specific instructions. Your treatment has been planned according to current medical practices, but problems sometimes occur. Call your caregiver if  you have any problems or questions after your procedure. HOME CARE INSTRUCTIONS You may shower 24 hours after the procedure. Remove the bandage (dressing) and gently wash the site with plain soap and water. Gently pat the site dry.  Do not apply powder or lotion to the site.  Do not sit in a bathtub, swimming pool, or whirlpool for 5 to 7 days.  No bending, squatting, or lifting anything over 10 pounds (4.5 kg) as directed by your caregiver.  Inspect the site at least twice daily.  Do not drive home if you are discharged the same day of the procedure. Have someone else drive you.  You may drive 24 hours after the procedure unless otherwise instructed by your caregiver.  What to expect: Any bruising will usually fade within 1 to 2 weeks.  Blood that collects in the tissue (hematoma) may be painful to the touch. It should usually decrease in size and tenderness within 1 to 2 weeks.  SEEK IMMEDIATE MEDICAL CARE IF: You have unusual pain at the groin site or down the affected leg.  You have redness, warmth, swelling, or pain at the groin site.  You have drainage (other than a small amount of blood on the dressing).  You have  chills.  You have a fever or persistent symptoms for more than 72 hours.  You have a fever and your symptoms suddenly get worse.  Your leg becomes pale, cool, tingly, or numb.  You have heavy bleeding from the site. Hold pressure on the site. . Radial Site Care Refer to this sheet in the next few weeks. These instructions provide you with information on caring for yourself after your procedure. Your caregiver may also give you more specific instructions. Your treatment has been planned according to current medical practices, but problems sometimes occur. Call your caregiver if you have any problems or questions after your procedure. HOME CARE INSTRUCTIONS You may shower the day after the procedure.Remove the bandage (dressing) and gently wash the site with plain soap and water.Gently pat the site dry.  Do not apply powder or lotion to the site.  Do not submerge the affected site in water for 3 to 5 days.  Inspect the site at least twice daily.  Do not flex or bend the affected arm for 24 hours.  No lifting over 5 pounds (2.3 kg) for 5 days after your procedure.  Do not drive home if you are discharged the same day of the procedure. Have someone else drive you.  You may drive 24 hours after the procedure unless otherwise instructed by your caregiver.  What to expect: Any bruising will usually fade within 1 to 2 weeks.  Blood that collects in the tissue (hematoma) may be painful to the touch. It should usually decrease in size and tenderness within 1 to 2 weeks.  SEEK IMMEDIATE MEDICAL CARE IF: You have unusual pain at the radial site.  You have redness, warmth, swelling, or pain at the radial site.  You have drainage (other than a small amount of blood on the dressing).  You have chills.  You have a fever or persistent symptoms for more than 72 hours.  You have a fever and your symptoms suddenly get worse.  Your arm becomes pale, cool, tingly, or numb.  You have heavy bleeding from the  site. Hold pressure on the site.   PLEASE DO NOT MISS ANY DOSES OF YOUR PLAVIX!!!!! Also keep a log of you blood pressures and bring back to  your follow up appt. Please call the office with any questions.   Patients taking blood thinners should generally stay away from medicines like ibuprofen, Advil, Motrin, naproxen, and Aleve due to risk of stomach bleeding. You may take Tylenol as directed or talk to your primary doctor about alternatives.   Increase activity slowly   Complete by:  As directed       Discharge Medications     Medication List    STOP taking these medications   ibuprofen 200 MG tablet Commonly known as:  ADVIL,MOTRIN   naproxen sodium 220 MG tablet Commonly known as:  ALEVE     TAKE these medications   aspirin 81 MG tablet Take 1 tablet (81 mg total) by mouth daily.   atenolol 25 MG tablet Commonly known as:  TENORMIN Take 0.5 tablets (12.5 mg total) by mouth daily. What changed:  how much to take   atorvastatin 80 MG tablet Commonly known as:  LIPITOR Take 80 mg by mouth daily.   clopidogrel 75 MG tablet Commonly known as:  PLAVIX Take 1 tablet (75 mg total) by mouth daily with breakfast.   colesevelam 625 MG tablet Commonly known as:  WELCHOL Take 1,250 mg by mouth 2 (two) times daily with a meal.   hydrochlorothiazide 25 MG tablet Commonly known as:  HYDRODIURIL Take 25 mg by mouth daily.   levothyroxine 100 MCG tablet Commonly known as:  SYNTHROID, LEVOTHROID Take 100 mcg by mouth daily.   nitroGLYCERIN 0.4 MG SL tablet Commonly known as:  NITROSTAT Place 1 tablet (0.4 mg total) under the tongue every 5 (five) minutes as needed for chest pain.   sertraline 50 MG tablet Commonly known as:  ZOLOFT Take 50 mg by mouth daily.   WOMENS MULTIVITAMIN PO Take 1 tablet by mouth 3 (three) times a week.        Acute coronary syndrome (MI, NSTEMI, STEMI, etc) this admission?: No.     Outstanding Labs/Studies   N/a   Duration of  Discharge Encounter   Greater than 30 minutes including physician time.  Signed, Reino Bellis NP-C 06/05/2018, 9:14 AM

## 2018-06-05 NOTE — Progress Notes (Signed)
Progress Note  Patient Name: Erica [name] Date of Encounter: 06/05/2018  Primary Cardiologist: No primary care provider on file.   Subjective   Feels well this morning.  No chest pain or shortness of breath.  Inpatient Medications    Scheduled Meds: . aspirin EC  81 mg Oral Daily  . atenolol  25 mg Oral Daily  . atorvastatin  80 mg Oral Daily  . clopidogrel  75 mg Oral Q breakfast  . colesevelam  1,250 mg Oral BID WC  . hydrochlorothiazide  25 mg Oral Daily  . levothyroxine  100 mcg Oral QAC breakfast  . sertraline  50 mg Oral Daily  . sodium chloride flush  3 mL Intravenous Q12H   Continuous Infusions: . sodium chloride     PRN Meds: sodium chloride, acetaminophen, ibuprofen, nitroGLYCERIN, ondansetron (ZOFRAN) IV, sodium chloride flush   Vital Signs    Vitals:   06/04/18 1947 06/04/18 2000 06/05/18 0400 06/05/18 0707  BP: (!) 197/42 (!) 172/53 (!) 153/42 (!) 145/42  Pulse: (!) 55 (!) 40 (!) 47 (!) 46  Resp: 12 18 12 11   Temp: 97.9 F (36.6 C)  97.9 F (36.6 C) 97.9 F (36.6 C)  TempSrc: Oral  Oral Oral  SpO2: 99% 97% 96% 97%  Weight:   129 lb 11.2 oz (58.8 kg)   Height:        Intake/Output Summary (Last 24 hours) at 06/05/2018 0813 Last data filed at 06/05/2018 0548 Gross per 24 hour  Intake 834.46 ml  Output 500 ml  Net 334.46 ml   Filed Weights   06/04/18 1115 06/05/18 0400  Weight: 126 lb (57.2 kg) 129 lb 11.2 oz (58.8 kg)    Telemetry    Sinus bradycardia heart rate 45 to 55 bpm- Personally Reviewed  ECG    Marked sinus bradycardia heart rate 44 bpm bradycardic and regular- Personally Reviewed  Physical Exam  Alert, oriented, in NAD GEN: No acute distress.   Neck: No JVD Cardiac: , no murmurs, rubs, or gallops.  Respiratory: Clear to auscultation bilaterally. GI: Soft, nontender, non-distended  MS: No edema; No deformity.  Right groin site is clear with minimal ecchymoses, no tenderness or hematoma Neuro:  Nonfocal  Psych: Normal  affect   Labs    Chemistry Recent Labs  Lab 06/05/18 0333  NA 141  K 3.6  CL 109  CO2 28  GLUCOSE 97  BUN 16  CREATININE 0.85  CALCIUM 8.4*  GFRNONAA >60  GFRAA >60  ANIONGAP 4*     Hematology Recent Labs  Lab 06/05/18 0333  WBC 5.8  RBC 4.22  HGB 12.6  HCT 38.0  MCV 90.0  MCH 29.9  MCHC 33.2  RDW 13.0  PLT 197    Cardiac EnzymesNo results for input(s): TROPONINI in the last 168 hours. No results for input(s): TROPIPOC in the last 168 hours.   BNPNo results for input(s): BNP, PROBNP in the last 168 hours.   DDimer No results for input(s): DDIMER in the last 168 hours.   Radiology    No results found.  Cardiac Studies   Cath findings as outlined  Patient Profile     77 y.o. female who presented with progressive anginal symptoms with background of known CAD status post CABG and previously multiple PCI procedures.  Found to have two-vessel coronary artery disease with successful cutting balloon angioplasty of in-stent restenosis in the RCA and positive FFR of the proximal LAD with lesion noted to have heavy calcification.  Assessment & Plan    1.  Two-vessel coronary artery disease with CCS class III angina: Patient tolerated PCI of the RCA well without any immediate complication.  She is now on dual antiplatelet therapy with aspirin and clopidogrel.  Plan staged PCI of the proximal LAD with orbital atherectomy.  Will arrange close outpatient follow-up and schedule PCI at that time.  Continue high intensity statin drug.  2.  Bradycardia: Asymptomatic, sinus bradycardia.  With heart rate in the 40s and 50s, will reduce atenolol to 12.5 mg daily.  Disposition: Stable for discharge home this morning.  APP follow-up 1 to 2 weeks.  We will go ahead and tentatively schedule atherectomy and PCI of the LAD, but plan to reassess in 1 to 2 weeks at outpatient follow-up.  For questions or updates, please contact [place] Please consult [email or web address] for contact  info under Cardiology/STEMI.      Signed, [name], MD  06/05/2018, 8:13 AM

## 2018-06-05 NOTE — Care Management Note (Signed)
Case Management Note  Patient Details  Name: Erica [name] MRN: [id number] Date of Birth: 1941-07-02  Subjective/Objective:                    Action/Plan: Pt discharging home with self care.  PCP: Dr Brigitte Pulse Insurance: [employer] medicare Pt has transportation home.   Expected Discharge Date:  06/05/18               Expected Discharge Plan:  Home/Self Care  In-House Referral:     Discharge planning Services     Post Acute Care Choice:    Choice offered to:     DME Arranged:    DME Agency:     HH Arranged:    HH Agency:     Status of Service:  Completed, signed off  If discussed at [employer] of Stay Meetings, dates discussed:    Additional Comments:  Pollie Friar, RN 06/05/2018, 9:18 AM

## 2018-06-05 NOTE — Progress Notes (Signed)
Pt.had 3.21 pause ;pt.s asymptomatic.Will continue to monitor pt.

## 2018-06-06 ENCOUNTER — Encounter (HOSPITAL_COMMUNITY): Payer: Self-pay | Admitting: Cardiovascular Disease

## 2018-06-06 ENCOUNTER — Telehealth: Payer: Self-pay | Admitting: Cardiovascular Disease

## 2018-06-06 MED FILL — Heparin Sodium (Porcine) Inj 1000 Unit/ML: INTRAMUSCULAR | Qty: 10 | Status: AC

## 2018-06-06 MED FILL — Verapamil HCl IV Soln 2.5 MG/ML: INTRAVENOUS | Qty: 2 | Status: AC

## 2018-06-06 MED FILL — Heparin Sod (Porcine)-NaCl IV Soln 1000 Unit/500ML-0.9%: INTRAVENOUS | Qty: 1000 | Status: AC

## 2018-06-06 NOTE — Telephone Encounter (Signed)
Pt returned call  She states she is having some bloody drainage from her cath site. She states she had a cath on 7/3, she has full sensation in her leg and toes, mild swelling and bruising. She states she changed the band aid twice today and is concerned if she should take her Plavix. I reviewed with Dr. Meda Coffee (DOD). Per MD continue to monitor site and and call office on Monday if symptoms do not improve and continue taking Plavix. She stated understanding and thankful for the call.

## 2018-06-06 NOTE — Telephone Encounter (Signed)
left message to call back.

## 2018-06-06 NOTE — Telephone Encounter (Signed)
New Message:      Pt states she had an procedure done on yesterday and the site just keeps oozing with blood and it will not stop.

## 2018-06-09 ENCOUNTER — Telehealth: Payer: Self-pay | Admitting: Cardiovascular Disease

## 2018-06-09 NOTE — Telephone Encounter (Signed)
Left message to call back  

## 2018-06-09 NOTE — Telephone Encounter (Signed)
New Message   Pt states she was told to call in today and get an appt with the PA to follow up from an angioplasty issue this week but no appts available until August. Pt wants to speak to a nurse. Please call

## 2018-06-11 NOTE — Telephone Encounter (Signed)
-----   Message from [name], MD sent at 06/05/2018  8:26 AM EDT ----- Erica [name]: she had PCI yesterday and needs CSI orbital atherectomy and PCI of the LAD set up as a staged procedure. She will have a 1-2 week APP FU visit, but thought we should go ahead and get her on the books for me for next available outpatient cath slot since I'm booked out so far. 3-4 weeks is fine.   thx - Coop

## 2018-06-11 NOTE — Telephone Encounter (Signed)
Scheduled patient for staged atherectomy and PCI 8/2.  Left message for her with date of procedure. She understands she will get instructions when she comes for her TOC visit 7/23. She was instructed to call prior to that time if she has questions or concerns.

## 2018-06-17 ENCOUNTER — Ambulatory Visit: Payer: Medicare HMO | Admitting: Cardiovascular Disease

## 2018-06-17 ENCOUNTER — Encounter: Payer: Self-pay | Admitting: Cardiovascular Disease

## 2018-06-17 VITALS — BP 139/60 | HR 46 | Ht 62.0 in | Wt 126.6 lb

## 2018-06-17 DIAGNOSIS — I701 Atherosclerosis of renal artery: Secondary | ICD-10-CM | POA: Diagnosis not present

## 2018-06-17 DIAGNOSIS — I10 Essential (primary) hypertension: Secondary | ICD-10-CM

## 2018-06-17 DIAGNOSIS — I25118 Atherosclerotic heart disease of native coronary artery with other forms of angina pectoris: Secondary | ICD-10-CM

## 2018-06-17 DIAGNOSIS — E785 Hyperlipidemia, unspecified: Secondary | ICD-10-CM | POA: Diagnosis not present

## 2018-06-17 DIAGNOSIS — I739 Peripheral vascular disease, unspecified: Secondary | ICD-10-CM

## 2018-06-17 NOTE — Patient Instructions (Signed)
Medication Instructions: Your physician recommends that you continue on your current medications as directed. Please refer to the Current Medication list given to you today.  If you need a refill on your cardiac medications before your next appointment, please call your pharmacy.   Follow-Up: Your physician wants you to follow-up in 3 months with Dr. Fletcher Anon.  Thank you for choosing [facility] at [facility]!!

## 2018-06-17 NOTE — Progress Notes (Signed)
Cardiology Office Note   Date:  06/17/2018   ID:  AYEISHA [name], DOB [birth year], MRN [id number]  PCP:  Mayra Neer, MD  Cardiologist:  Dr. Burt Knack   No chief complaint on file.     History of Present Illness: Erica [name] is a 77 y.o. female who is here today for follow-up visit regarding peripheral arterial disease.    She has known history of coronary artery disease status post CABG in 2011, mild to moderate carotid disease, hyperlipidemia and essential hypertension.  She quit smoking at the age of 39. She is followed for left calf claudication. She was seen 6 months ago for left calf claudication.  Vascular studies showed mildly reduced ABI on the left at 0.76 with no significant velocity elevation on duplex.  She underwent CTA angiogram which showed significant narrowing of the distal aorta with high-grade stenosis of the mid left common femoral artery and moderate stenosis affecting the left popliteal artery with three-vessel runoff below the knee.  On the right, there was moderate stenosis affecting the right common iliac artery and moderate stenosis affecting the popliteal artery.  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 She was treated medically given that her symptoms were not lifestyle limiting.  She had worsening angina recently and was treated by Dr. Burt Knack.  Cardiac catheterization showed significant restenosis in the right coronary artery which was treated with cutting balloon angioplasty.  There is still significant calcified LAD disease with plans for atherectomy and stent placement next month. The patient reports worsening left calf claudication which is now happening after walking about 1 block.  She has mild right hip discomfort but she does not think that is vascular and more likely to be due to a joint problem.    Past Medical History:  Diagnosis Date  . Anxiety   . Chronic mid back pain   . Coronary artery  disease    status post multiple percutaneous interventions  . Depression   . History of kidney stones 1990s X 1; 2016   . Hyperlipidemia   . Hypertension   . Hypothyroidism   . Myocardial infarction ([place]) 01/2010  . Osteoarthritis    "hands, back" (06/04/2018)    Past Surgical History:  Procedure Laterality Date  . CARDIAC CATHETERIZATION  12-2008  . CATARACT EXTRACTION W/ INTRAOCULAR LENS IMPLANT Bilateral   . CORONARY ANGIOPLASTY WITH STENT PLACEMENT  2006    right coronary artery with drug-eluting stent  . CORONARY ANGIOPLASTY WITH STENT PLACEMENT  1998    bare-metal stent to the right coronary artery  . CORONARY ANGIOPLASTY WITH STENT PLACEMENT  2008   drug-eluting stent placed in the left circumflex  . CORONARY ARTERY BYPASS GRAFT  01-25-10   CABG X4  . CORONARY BALLOON ANGIOPLASTY  06/04/2018   cutting balloon  . CORONARY BALLOON ANGIOPLASTY N/A 06/04/2018   Procedure: CORONARY BALLOON ANGIOPLASTY;  Surgeon: [name], MD;  Location: [facility];  Service: Cardiovascular;  Laterality: N/A;  . CYSTOSCOPY WITH RETROGRADE PYELOGRAM, URETEROSCOPY AND STENT PLACEMENT Right 10/18/2015   Procedure: CYSTOSCOPY WITH RETROGRADE PYELOGRAM,  AND STENT PLACEMENT;  Surgeon: Festus Aloe, MD;  Location: [facility];  Service: Urology;  Laterality: Right;  . CYSTOSCOPY/URETEROSCOPY/HOLMIUM LASER/STENT PLACEMENT Right 11/29/2015   Procedure: RIGHT URETEROSCOPY/RIGHT RETROGRADE PYELOGRAM/ RIGHT STENT REPLACEMENT;  Surgeon: Festus Aloe, MD;  Location: [facility];  Service: Urology;  Laterality: Right;  . FOOT SURGERY Bilateral 2008-2016   "bones shortened"  .  INTRAVASCULAR PRESSURE WIRE/FFR STUDY N/A 06/04/2018   Procedure: INTRAVASCULAR PRESSURE WIRE/FFR STUDY;  Surgeon: [name], MD;  Location: [facility];  Service: Cardiovascular;  Laterality: N/A;  . LEFT HEART CATH AND CORS/GRAFTS ANGIOGRAPHY N/A 06/04/2018   Procedure: LEFT HEART CATH AND  CORS/GRAFTS ANGIOGRAPHY;  Surgeon: [name], MD;  Location: [facility];  Service: Cardiovascular;  Laterality: N/A;  . ULTRASOUND GUIDANCE FOR VASCULAR ACCESS  06/04/2018   Procedure: Ultrasound Guidance For Vascular Access;  Surgeon: [name], MD;  Location: [facility];  Service: Cardiovascular;;     Current Outpatient Medications  Medication Sig Dispense Refill  . aspirin 81 MG tablet Take 1 tablet (81 mg total) by mouth daily. 30 tablet 0  . atenolol (TENORMIN) 25 MG tablet Take 0.5 tablets (12.5 mg total) by mouth daily. 30 tablet 2  . atorvastatin (LIPITOR) 80 MG tablet Take 80 mg by mouth daily.    . clopidogrel (PLAVIX) 75 MG tablet Take 1 tablet (75 mg total) by mouth daily with breakfast. 90 tablet 2  . colesevelam (WELCHOL) 625 MG tablet Take 1,250 mg by mouth 2 (two) times daily with a meal.    . hydrochlorothiazide (HYDRODIURIL) 25 MG tablet Take 25 mg by mouth daily.    Marland Kitchen levothyroxine (SYNTHROID, LEVOTHROID) 100 MCG tablet Take 100 mcg by mouth daily.    . Multiple Vitamins-Minerals (WOMENS MULTIVITAMIN PO) Take 1 tablet by mouth 3 (three) times a week.    . nitroGLYCERIN (NITROSTAT) 0.4 MG SL tablet Place 1 tablet (0.4 mg total) under the tongue every 5 (five) minutes as needed for chest pain. 25 tablet 3  . sertraline (ZOLOFT) 50 MG tablet Take 50 mg by mouth daily.      No current facility-administered medications for this visit.     Allergies:   Morphine and related    Social History:  The patient  reports that she quit smoking about 21 years ago. Her smoking use included cigarettes. She has a 37.00 pack-year smoking history. She has never used smokeless tobacco. She reports that she drank alcohol. She reports that she does not use drugs.   Family History:  The patient's family history includes Other in her unknown relative; Other (age of onset: 59) in her mother.    ROS:  Please see the history of present illness.   Otherwise, review of  systems are positive for none.   All other systems are reviewed and negative.    PHYSICAL EXAM: VS:  There were no vitals taken for this visit. , BMI There is no height or weight on file to calculate BMI. GEN: Well nourished, well developed, in no acute distress  HEENT: normal  Neck: no JVD, or masses.  Faint right carotid bruit Cardiac: RRR; no murmurs, rubs, or gallops,no edema  Respiratory:  clear to auscultation bilaterally, normal work of breathing GI: soft, nontender, nondistended, + BS MS: no deformity or atrophy  Skin: warm and dry, no rash Neuro:  Strength and sensation are intact Psych: euthymic mood, full affect Vascular: Femoral pulses +2 on the right side and +1 on the left.  Distal pulses are diminished but palpable.   EKG:  EKG is not ordered today.   Recent Labs: 05/26/2018: ALT 21 06/05/2018: BUN 16; Creatinine, Ser 0.85; Hemoglobin 12.6; Platelets 197; Potassium 3.6; Sodium 141    Lipid Panel    Component Value Date/Time   CHOL  01/09/2010 0506    131  ATP III CLASSIFICATION:  <200     mg/dL   Desirable  200-239  mg/dL   Borderline High  >=240    mg/dL   High          TRIG 87 01/09/2010 0506   HDL 48 01/09/2010 0506   CHOLHDL 2.7 01/09/2010 0506   VLDL 17 01/09/2010 0506   LDLCALC  01/09/2010 0506    66        Total Cholesterol/HDL:CHD Risk Coronary Heart Disease Risk Table                     Men   Women  1/2 Average Risk   3.4   3.3  Average Risk       5.0   4.4  2 X Average Risk   9.6   7.1  3 X Average Risk  23.4   11.0        Use the calculated Patient Ratio above and the CHD Risk Table to determine the patient's CHD Risk.        ATP III CLASSIFICATION (LDL):  <100     mg/dL   Optimal  100-129  mg/dL   Near or Above                    Optimal  130-159  mg/dL   Borderline  160-189  mg/dL   High  >190     mg/dL   Very High      Wt Readings from Last 3 Encounters:  06/05/18 129 lb 11.2 oz (58.8 kg)  05/26/18 126 lb 12.8 oz (57.5  kg)  12/10/17 129 lb (58.5 kg)      No flowsheet data found.    ASSESSMENT AND PLAN:  1.  Peripheral arterial disease: Worsening left calf claudication likely due to progression of common femoral artery and popliteal disease.  In spite of significant disease in the distal aorta noted on CT scan, she does not seem to be having hip claudication.  I suspect that she will require angiography and revascularization on the left side given that her symptoms are now lifestyle limiting. However, this is not urgent and should be done after finishing coronary revascularization.  She is scheduled for LAD atherectomy and stent placement next month.  I will plan on seeing her back in 3 months.  She is already on optimal medical therapy.  2.  Coronary artery disease involving native coronary arteries with other forms of angina: Improved after RCA angioplasty and is scheduled for a staged LAD atherectomy and PCI next month.  3.  Renal artery stenosis: Noted on CTA on the right side.  No indication for revascularization given that her blood pressure is controlled with no heart failure or progressive chronic kidney disease.  4.  Essential hypertension: Blood pressure is controlled on current medications.  5.   Hyperlipidemia: Continue high-dose atorvastatin with a target LDL of less than 70.   Disposition:   FU with me in 3 months  Signed,  Kathlyn Sacramento, MD  06/17/2018 10:37 AM    [place]

## 2018-06-18 DIAGNOSIS — M25552 Pain in left hip: Secondary | ICD-10-CM | POA: Diagnosis not present

## 2018-06-23 DIAGNOSIS — M25552 Pain in left hip: Secondary | ICD-10-CM | POA: Diagnosis not present

## 2018-06-24 ENCOUNTER — Encounter: Payer: Self-pay | Admitting: Nurse Practitioner

## 2018-06-24 ENCOUNTER — Ambulatory Visit: Payer: Medicare HMO | Admitting: Nurse Practitioner

## 2018-06-24 VITALS — BP 138/82 | HR 51 | Ht 62.0 in | Wt 127.1 lb

## 2018-06-24 DIAGNOSIS — I739 Peripheral vascular disease, unspecified: Secondary | ICD-10-CM | POA: Diagnosis not present

## 2018-06-24 DIAGNOSIS — Z955 Presence of coronary angioplasty implant and graft: Secondary | ICD-10-CM

## 2018-06-24 DIAGNOSIS — I259 Chronic ischemic heart disease, unspecified: Secondary | ICD-10-CM | POA: Diagnosis not present

## 2018-06-24 LAB — CBC
Hematocrit: 40.4 % (ref 34.0–46.6)
Hemoglobin: 13.8 g/dL (ref 11.1–15.9)
MCH: 30.7 pg (ref 26.6–33.0)
MCHC: 34.2 g/dL (ref 31.5–35.7)
MCV: 90 fL (ref 79–97)
Platelets: 269 10*3/uL (ref 150–450)
RBC: 4.49 x10E6/uL (ref 3.77–5.28)
RDW: 13.6 % (ref 12.3–15.4)
WBC: 6.1 10*3/uL (ref 3.4–10.8)

## 2018-06-24 LAB — BASIC METABOLIC PANEL
BUN/Creatinine Ratio: 21 (ref 12–28)
BUN: 21 mg/dL (ref 8–27)
CO2: 24 mmol/L (ref 20–29)
Calcium: 9.6 mg/dL (ref 8.7–10.3)
Chloride: 101 mmol/L (ref 96–106)
Creatinine, Ser: 1.01 mg/dL — ABNORMAL HIGH (ref 0.57–1.00)
GFR calc Af Amer: 62 mL/min/{1.73_m2} (ref >59)
GFR calc non Af Amer: 54 mL/min/{1.73_m2} — ABNORMAL LOW (ref >59)
Glucose: 111 mg/dL — ABNORMAL HIGH (ref 65–99)
Potassium: 4.4 mmol/L (ref 3.5–5.2)
Sodium: 140 mmol/L (ref 134–144)

## 2018-06-24 NOTE — Progress Notes (Signed)
CARDIOLOGY OFFICE NOTE  Date:  06/24/2018    Erica [name] Date of Birth: [birth year] Medical Record #[id number]  PCP:  Erica [name]  Cardiologist:  Erica [name]    Chief Complaint  Patient presents with  . Coronary Artery Disease    Post hospital visit - seen for Erica [name]    History of Present Illness: Erica [name] is a 77 y.o. female who presents today for a post hospital/TOC - however no TOC phone call documented. Seen for Erica [name]. She sees Erica [name] for her PAD.   She has a history of CAD with remoteCABG in 2011 with grafting of the LIMA to LAD, saphenous vein graft to PDA and PLA, and saphenous vein graft to OM- per Dr. Cyndia [name].Her other issues include carotid disease, HLD and PAD - followed by Erica [name].   Last Myoview was in January of 2018 - this demonstrated normal perfusion and normal LV function.Saw Erica [name] in December - 2 episodes of chest pain but felt to be stable.   I then saw her last month as a work in - her chest pain had progressed. Her claudication was worse. She was referred for repeat cath - see below.   She underwent cardiac catheterization with successful PCI of the RCA without complication.  She will be continued on DAPT with aspirin and Plavix.  There is a residual lesion in the proximal LAD that will require orbital atherectomy.  She was bradycardic on telemetry and her atenolol was reduced to 12.5 mg daily. She did have continued patency of the SVG-OM, total occlusion of the SVG-PDA/PLA and atresia of the LIMA-LAD noted.   She saw Erica [name] last week for her PAD/claudication - he "suspects that she will require angiography and revascularization on the left side given that her symptoms are now lifestyle limiting. However, this is not urgent and should be done after finishing coronary revascularization.  She is scheduled for LAD atherectomy and stent placement next month.  He will plan on seeing her back in 3 months.  She is  already on optimal medical therapy".  Comes in today. Here alone. She is feeling much better. She has only had one spell of chest pain requiring NTG since her PCI. She is anxious about her upcoming procedure due to prior complication with atherectomy. She is bruising some. BP and HR diary from home are pretty stable. Her beta blocker was cut back. She is not symptomatic. She is otherwise doing ok. She has lots of family coming at the beginning of August - this is pretty stressful for her. Her groin is now fine.   Past Medical History:  Diagnosis Date  . Anxiety   . Chronic mid back pain   . Coronary artery disease    status post multiple percutaneous interventions  . Depression   . History of kidney stones 1990s X 1; 2016   . Hyperlipidemia   . Hypertension   . Hypothyroidism   . Myocardial infarction ([place]) 01/2010  . Osteoarthritis    "hands, back" (06/04/2018)    Past Surgical History:  Procedure Laterality Date  . CARDIAC CATHETERIZATION  12-2008  . CATARACT EXTRACTION W/ INTRAOCULAR LENS IMPLANT Bilateral   . CORONARY ANGIOPLASTY WITH STENT PLACEMENT  2006    right coronary artery with drug-eluting stent  . CORONARY ANGIOPLASTY WITH STENT PLACEMENT  1998    bare-metal stent to the right coronary artery  . CORONARY ANGIOPLASTY WITH STENT PLACEMENT  2008  drug-eluting stent placed in the left circumflex  . CORONARY ARTERY BYPASS GRAFT  01-25-10   CABG X4  . CORONARY BALLOON ANGIOPLASTY  06/04/2018   cutting balloon  . CORONARY BALLOON ANGIOPLASTY N/A 06/04/2018   Procedure: CORONARY BALLOON ANGIOPLASTY;  Surgeon: Erica [name];  Location: [facility];  Service: Cardiovascular;  Laterality: N/A;  . CYSTOSCOPY WITH RETROGRADE PYELOGRAM, URETEROSCOPY AND STENT PLACEMENT Right 10/18/2015   Procedure: CYSTOSCOPY WITH RETROGRADE PYELOGRAM,  AND STENT PLACEMENT;  Surgeon: Erica Aloe, [name];  Location: [facility];  Service: Urology;  Laterality: Right;  .  CYSTOSCOPY/URETEROSCOPY/HOLMIUM LASER/STENT PLACEMENT Right 11/29/2015   Procedure: RIGHT URETEROSCOPY/RIGHT RETROGRADE PYELOGRAM/ RIGHT STENT REPLACEMENT;  Surgeon: Erica Aloe, [name];  Location: [facility];  Service: Urology;  Laterality: Right;  . FOOT SURGERY Bilateral 2008-2016   "bones shortened"  . INTRAVASCULAR PRESSURE WIRE/FFR STUDY N/A 06/04/2018   Procedure: INTRAVASCULAR PRESSURE WIRE/FFR STUDY;  Surgeon: Erica [name];  Location: [facility] [facility];  Service: Cardiovascular;  Laterality: N/A;  . LEFT HEART CATH AND CORS/GRAFTS ANGIOGRAPHY N/A 06/04/2018   Procedure: LEFT HEART CATH AND CORS/GRAFTS ANGIOGRAPHY;  Surgeon: Erica [name];  Location: [facility];  Service: Cardiovascular;  Laterality: N/A;  . ULTRASOUND GUIDANCE FOR VASCULAR ACCESS  06/04/2018   Procedure: Ultrasound Guidance For Vascular Access;  Surgeon: Erica [name];  Location: [facility];  Service: Cardiovascular;;     Medications: Current Meds  Medication Sig  . aspirin 81 MG tablet Take 1 tablet (81 mg total) by mouth daily.  Marland Kitchen atenolol (TENORMIN) 25 MG tablet Take 0.5 tablets (12.5 mg total) by mouth daily.  Marland Kitchen atorvastatin (LIPITOR) 80 MG tablet Take 80 mg by mouth daily.  . clopidogrel (PLAVIX) 75 MG tablet Take 1 tablet (75 mg total) by mouth daily with breakfast.  . colesevelam (WELCHOL) 625 MG tablet Take 1,250 mg by mouth 2 (two) times daily with a meal.  . hydrochlorothiazide (HYDRODIURIL) 25 MG tablet Take 25 mg by mouth daily.  Marland Kitchen levothyroxine (SYNTHROID, LEVOTHROID) 100 MCG tablet Take 100 mcg by mouth daily.  . Multiple Vitamins-Minerals (WOMENS MULTIVITAMIN PO) Take 1 tablet by mouth 3 (three) times a week.  . nitroGLYCERIN (NITROSTAT) 0.4 MG SL tablet Place 1 tablet (0.4 mg total) under the tongue every 5 (five) minutes as needed for chest pain.  Marland Kitchen sertraline (ZOLOFT) 50 MG tablet Take 50 mg by mouth daily.      Allergies: Allergies  Allergen  Reactions  . Morphine And Related Other (See Comments)    Sick on stomach    Social History: The patient  reports that she quit smoking about 21 years ago. Her smoking use included cigarettes. She has a 37.00 pack-year smoking history. She has never used smokeless tobacco. She reports that she drank alcohol. She reports that she does not use drugs.   Family History: The patient's family history includes Other in her unknown relative; Other (age of onset: 4) in her mother.   Review of Systems: Please see the history of present illness.   Otherwise, the review of systems is positive for none.   All other systems are reviewed and negative.   Physical Exam: VS:  BP 138/82 (BP Location: Left Arm, Patient Position: Sitting, Cuff Size: Normal)   Pulse (!) 51   Ht 5\' 2"  (1.575 m)   Wt 127 lb 1.9 oz (57.7 kg)   SpO2 98% Comment: at rest  BMI 23.25 kg/m  .  BMI Body mass  index is 23.25 kg/m.  Wt Readings from Last 3 Encounters:  06/24/18 127 lb 1.9 oz (57.7 kg)  06/17/18 126 lb 9.6 oz (57.4 kg)  06/05/18 129 lb 11.2 oz (58.8 kg)    General: Pleasant. Well developed, well nourished and in no acute distress.   HEENT: Normal.  Neck: Supple, no JVD, carotid bruits, or masses noted.  Cardiac: Regular rate and rhythm. No murmurs, rubs, or gallops. No edema.  Respiratory:  Lungs are clear to auscultation bilaterally with normal work of breathing.  GI: Soft and nontender.  MS: No deformity or atrophy. Gait and ROM intact.  Skin: Warm and dry. Color is normal.  Neuro:  Strength and sensation are intact and no gross focal deficits noted.  Psych: Alert, appropriate and with normal affect.   LABORATORY DATA:  EKG:  EKG is not ordered today.  Lab Results  Component Value Date   WBC 5.8 06/05/2018   HGB 12.6 06/05/2018   HCT 38.0 06/05/2018   PLT 197 06/05/2018   GLUCOSE 97 06/05/2018   CHOL  01/09/2010    131        ATP III CLASSIFICATION:  <200     mg/dL   Desirable  200-239   mg/dL   Borderline High  >=240    mg/dL   High          TRIG 87 01/09/2010   HDL 48 01/09/2010   LDLCALC  01/09/2010    66        Total Cholesterol/HDL:CHD Risk Coronary Heart Disease Risk Table                     Men   Women  1/2 Average Risk   3.4   3.3  Average Risk       5.0   4.4  2 X Average Risk   9.6   7.1  3 X Average Risk  23.4   11.0        Use the calculated Patient Ratio above and the CHD Risk Table to determine the patient's CHD Risk.        ATP III CLASSIFICATION (LDL):  <100     mg/dL   Optimal  100-129  mg/dL   Near or Above                    Optimal  130-159  mg/dL   Borderline  160-189  mg/dL   High  >190     mg/dL   Very High   ALT 21 05/26/2018   AST 18 05/26/2018   NA 141 06/05/2018   K 3.6 06/05/2018   CL 109 06/05/2018   CREATININE 0.85 06/05/2018   BUN 16 06/05/2018   CO2 28 06/05/2018   TSH 1.488 Test methodology is 3rd generation TSH 01/08/2010   INR 1.33 10/18/2015   HGBA1C (H) 01/23/2010    6.2 (NOTE) The ADA recommends the following therapeutic goal for glycemic control related to Hgb A1c measurement: Goal of therapy: <6.5 Hgb A1c  Reference: American Diabetes Association: Clinical Practice Recommendations 2010, Diabetes Care, 2010, 33: (Suppl  1).         BNP (last 3 results) No results for input(s): BNP in the last 8760 hours.  ProBNP (last 3 results) No results for input(s): PROBNP in the last 8760 hours.   Other Studies Reviewed Today:  Cath: 06/04/18  Conclusion     Ost Cx to Prox Cx lesion is 100% stenosed.  SVG  and is normal in caliber.  LIMA.  Ost LAD to Prox LAD lesion is 75% stenosed.  Mid RCA lesion is 75% stenosed.  Scoring balloon angioplasty was performed using a BALLOON WOLVERINE 2.50X10.  Post intervention, there is a 10% residual stenosis.  Ost RCA to Prox RCA lesion is 40% stenosed.  Seq SVG-.  Origin lesion before RPDA is 100% stenosed.  The left ventricular systolic function is  normal.  LV end diastolic pressure is normal.  1. Severe multivessel CAD with total occlusion of the LCx, severe in-stent restenosis in the RCA, and moderately severe proximal LAD stenosis 2. 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 Mild segmental LV contraction abnormality with preserved LVEF of 55% 4. FFR of the proximal LAD = 0.77 5. FFR of the mid-RCA = 0.75, treated successfully with cutting balloon angioplasty  Recommend: Staged PCI of the LAD - will require orbital atherectomy and stenting Should be OK for DC home tomorrow am and will schedule for outpatient PCI of the LAD with atherectomy  Recommend uninterrupted dual antiplatelet therapy with Aspirin 81mg  daily and Clopidogrel 75mg  daily for a minimum of 6 months (stable ischemic heart disease - Class I recommendation).      Assessment/Plan:  1. CAD with prior CABG in 2011 - now s/p PCI to the RCA - has residual disease in the LAD - her chest pain has improved considerably. Her next PCI is planned for 07/16/2018 with Erica [name]. She is agreeable to proceeding. The patient understands that risks include but are not limited to stroke (1 in 1000), death (1 in 57), kidney failure [usually temporary] (1 in 500), bleeding (1 in 200), allergic reaction [possibly serious] (1 in 200), and agrees to proceed. Will recheck lab today and then just prior to next PCI. Continue DAPT.   2. Prior bradycardia - improved with reduction in beta blocker - she is monitoring BP and HR at home.   3. HTN - BP ok on current regimen. No changes made.   4. HLD - on statin - may need to consider PCSK9 therapy - but her LDL on lab from last month has improved from prior - will discuss on return.   5.PAD - to follow back up with Erica [name] in 3 months for probable arteriogram.   Current medicines are reviewed with the patient today.  The patient does not have concerns regarding  medicines other than what has been noted above.  The following changes have been made:  See above.  Labs/ tests ordered today include:    Orders Placed This Encounter  Procedures  . Basic metabolic panel  . CBC  . Comprehensive metabolic panel  . CBC     Disposition:   FU with me post PCI in about 6 weeks.    Patient is agreeable to this plan and will call if any problems develop in the interim.   SignedTruitt Merle, NP  06/24/2018 10:23 AM  [place] [address] [place], [place]  [address] Phone: [phone] Fax: [phone]

## 2018-06-24 NOTE — H&P (View-Only) (Signed)
CARDIOLOGY OFFICE NOTE  Date:  06/24/2018    [name] Date of Birth: [birth year] Medical Record #[id number]  PCP:  Mayra Neer, MD  Cardiologist:  Jerel Shepherd    Chief Complaint  Patient presents with  . Coronary Artery Disease    Post hospital visit - seen for Dr. Burt Knack    History of Present Illness: Erica [name] is a 77 y.o. female who presents today for a post hospital/TOC - however no TOC phone call documented. Seen for Dr. Burt Knack. She sees Dr. Fletcher Anon for her PAD.   She has a history of CAD with remoteCABG in 2011 with grafting of the LIMA to LAD, saphenous vein graft to PDA and PLA, and saphenous vein graft to OM- per Dr. Cyndia Bent.Her other issues include carotid disease, HLD and PAD - followed by Dr. Fletcher Anon.   Last Myoview was in January of 2018 - this demonstrated normal perfusion and normal LV function.Saw Dr. Burt Knack in December - 2 episodes of chest pain but felt to be stable.   I then saw her last month as a work in - her chest pain had progressed. Her claudication was worse. She was referred for repeat cath - see below.   She underwent cardiac catheterization with successful PCI of the RCA without complication.  She will be continued on DAPT with aspirin and Plavix.  There is a residual lesion in the proximal LAD that will require orbital atherectomy.  She was bradycardic on telemetry and her atenolol was reduced to 12.5 mg daily. She did have continued patency of the SVG-OM, total occlusion of the SVG-PDA/PLA and atresia of the LIMA-LAD noted.   She saw Dr. Fletcher Anon last week for her PAD/claudication - he "suspects that she will require angiography and revascularization on the left side given that her symptoms are now lifestyle limiting. However, this is not urgent and should be done after finishing coronary revascularization.  She is scheduled for LAD atherectomy and stent placement next month.  He will plan on seeing her back in 3 months.  She is  already on optimal medical therapy".  Comes in today. Here alone. She is feeling much better. She has only had one spell of chest pain requiring NTG since her PCI. She is anxious about her upcoming procedure due to prior complication with atherectomy. She is bruising some. BP and HR diary from home are pretty stable. Her beta blocker was cut back. She is not symptomatic. She is otherwise doing ok. She has lots of family coming at the beginning of August - this is pretty stressful for her. Her groin is now fine.   Past Medical History:  Diagnosis Date  . Anxiety   . Chronic mid back pain   . Coronary artery disease    status post multiple percutaneous interventions  . Depression   . History of kidney stones 1990s X 1; 2016   . Hyperlipidemia   . Hypertension   . Hypothyroidism   . Myocardial infarction ([place]) 01/2010  . Osteoarthritis    "hands, back" (06/04/2018)    Past Surgical History:  Procedure Laterality Date  . CARDIAC CATHETERIZATION  12-2008  . CATARACT EXTRACTION W/ INTRAOCULAR LENS IMPLANT Bilateral   . CORONARY ANGIOPLASTY WITH STENT PLACEMENT  2006    right coronary artery with drug-eluting stent  . CORONARY ANGIOPLASTY WITH STENT PLACEMENT  1998    bare-metal stent to the right coronary artery  . CORONARY ANGIOPLASTY WITH STENT PLACEMENT  2008  drug-eluting stent placed in the left circumflex  . CORONARY ARTERY BYPASS GRAFT  01-25-10   CABG X4  . CORONARY BALLOON ANGIOPLASTY  06/04/2018   cutting balloon  . CORONARY BALLOON ANGIOPLASTY N/A 06/04/2018   Procedure: CORONARY BALLOON ANGIOPLASTY;  Surgeon: [name], MD;  Location: [facility];  Service: Cardiovascular;  Laterality: N/A;  . CYSTOSCOPY WITH RETROGRADE PYELOGRAM, URETEROSCOPY AND STENT PLACEMENT Right 10/18/2015   Procedure: CYSTOSCOPY WITH RETROGRADE PYELOGRAM,  AND STENT PLACEMENT;  Surgeon: Festus Aloe, MD;  Location: [facility];  Service: Urology;  Laterality: Right;  .  CYSTOSCOPY/URETEROSCOPY/HOLMIUM LASER/STENT PLACEMENT Right 11/29/2015   Procedure: RIGHT URETEROSCOPY/RIGHT RETROGRADE PYELOGRAM/ RIGHT STENT REPLACEMENT;  Surgeon: Festus Aloe, MD;  [facility];  Service: Urology;  Laterality: Right;  . FOOT SURGERY Bilateral 2008-2016   "bones shortened"  . INTRAVASCULAR PRESSURE WIRE/FFR STUDY N/A 06/04/2018   Procedure: INTRAVASCULAR PRESSURE WIRE/FFR STUDY;  Surgeon: [name], MD;  Location: [facility];  Service: Cardiovascular;  Laterality: N/A;  . LEFT HEART CATH AND CORS/GRAFTS ANGIOGRAPHY N/A 06/04/2018   Procedure: LEFT HEART CATH AND CORS/GRAFTS ANGIOGRAPHY;  Surgeon: [name], MD;  Location: [facility];  Service: Cardiovascular;  Laterality: N/A;  . ULTRASOUND GUIDANCE FOR VASCULAR ACCESS  06/04/2018   Procedure: Ultrasound Guidance For Vascular Access;  Surgeon: [name], MD;  Location: [facility];  Service: Cardiovascular;;     Medications: Current Meds  Medication Sig  . aspirin 81 MG tablet Take 1 tablet (81 mg total) by mouth daily.  Marland Kitchen atenolol (TENORMIN) 25 MG tablet Take 0.5 tablets (12.5 mg total) by mouth daily.  Marland Kitchen atorvastatin (LIPITOR) 80 MG tablet Take 80 mg by mouth daily.  . clopidogrel (PLAVIX) 75 MG tablet Take 1 tablet (75 mg total) by mouth daily with breakfast.  . colesevelam (WELCHOL) 625 MG tablet Take 1,250 mg by mouth 2 (two) times daily with a meal.  . hydrochlorothiazide (HYDRODIURIL) 25 MG tablet Take 25 mg by mouth daily.  Marland Kitchen levothyroxine (SYNTHROID, LEVOTHROID) 100 MCG tablet Take 100 mcg by mouth daily.  . Multiple Vitamins-Minerals (WOMENS MULTIVITAMIN PO) Take 1 tablet by mouth 3 (three) times a week.  . nitroGLYCERIN (NITROSTAT) 0.4 MG SL tablet Place 1 tablet (0.4 mg total) under the tongue every 5 (five) minutes as needed for chest pain.  Marland Kitchen sertraline (ZOLOFT) 50 MG tablet Take 50 mg by mouth daily.      Allergies: Allergies  Allergen  Reactions  . Morphine And Related Other (See Comments)    Sick on stomach    Social History: The patient  reports that she quit smoking about 21 years ago. Her smoking use included cigarettes. She has a 37.00 pack-year smoking history. She has never used smokeless tobacco. She reports that she drank alcohol. She reports that she does not use drugs.   Family History: The patient's family history includes Other in her unknown relative; Other (age of onset: 59) in her mother.   Review of Systems: Please see the history of present illness.   Otherwise, the review of systems is positive for none.   All other systems are reviewed and negative.   Physical Exam: VS:  BP 138/82 (BP Location: Left Arm, Patient Position: Sitting, Cuff Size: Normal)   Pulse (!) 51   Ht 5\' 2"  (1.575 m)   Wt 127 lb 1.9 oz (57.7 kg)   SpO2 98% Comment: at rest  BMI 23.25 kg/m  .  BMI Body mass  index is 23.25 kg/m.  Wt Readings from Last 3 Encounters:  06/24/18 127 lb 1.9 oz (57.7 kg)  06/17/18 126 lb 9.6 oz (57.4 kg)  06/05/18 129 lb 11.2 oz (58.8 kg)    General: Pleasant. Well developed, well nourished and in no acute distress.   HEENT: Normal.  Neck: Supple, no JVD, carotid bruits, or masses noted.  Cardiac: Regular rate and rhythm. No murmurs, rubs, or gallops. No edema.  Respiratory:  Lungs are clear to auscultation bilaterally with normal work of breathing.  GI: Soft and nontender.  MS: No deformity or atrophy. Gait and ROM intact.  Skin: Warm and dry. Color is normal.  Neuro:  Strength and sensation are intact and no gross focal deficits noted.  Psych: Alert, appropriate and with normal affect.   LABORATORY DATA:  EKG:  EKG is not ordered today.  Lab Results  Component Value Date   WBC 5.8 06/05/2018   HGB 12.6 06/05/2018   HCT 38.0 06/05/2018   PLT 197 06/05/2018   GLUCOSE 97 06/05/2018   CHOL  01/09/2010    131        ATP III CLASSIFICATION:  <200     mg/dL   Desirable  200-239   mg/dL   Borderline High  >=240    mg/dL   High          TRIG 87 01/09/2010   HDL 48 01/09/2010   LDLCALC  01/09/2010    66        Total Cholesterol/HDL:CHD Risk Coronary Heart Disease Risk Table                     Men   Women  1/2 Average Risk   3.4   3.3  Average Risk       5.0   4.4  2 X Average Risk   9.6   7.1  3 X Average Risk  23.4   11.0        Use the calculated Patient Ratio above and the CHD Risk Table to determine the patient's CHD Risk.        ATP III CLASSIFICATION (LDL):  <100     mg/dL   Optimal  100-129  mg/dL   Near or Above                    Optimal  130-159  mg/dL   Borderline  160-189  mg/dL   High  >190     mg/dL   Very High   ALT 21 05/26/2018   AST 18 05/26/2018   NA 141 06/05/2018   K 3.6 06/05/2018   CL 109 06/05/2018   CREATININE 0.85 06/05/2018   BUN 16 06/05/2018   CO2 28 06/05/2018   TSH 1.488 Test methodology is 3rd generation TSH 01/08/2010   INR 1.33 10/18/2015   HGBA1C (H) 01/23/2010    6.2 (NOTE) The ADA recommends the following therapeutic goal for glycemic control related to Hgb A1c measurement: Goal of therapy: <6.5 Hgb A1c  Reference: American Diabetes Association: Clinical Practice Recommendations 2010, Diabetes Care, 2010, 33: (Suppl  1).         BNP (last 3 results) No results for input(s): BNP in the last 8760 hours.  ProBNP (last 3 results) No results for input(s): PROBNP in the last 8760 hours.   Other Studies Reviewed Today:  Cath: 06/04/18  Conclusion     Ost Cx to Prox Cx lesion is 100% stenosed.  SVG  and is normal in caliber.  LIMA.  Ost LAD to Prox LAD lesion is 75% stenosed.  Mid RCA lesion is 75% stenosed.  Scoring balloon angioplasty was performed using a BALLOON WOLVERINE 2.50X10.  Post intervention, there is a 10% residual stenosis.  Ost RCA to Prox RCA lesion is 40% stenosed.  Seq SVG-.  Origin lesion before RPDA is 100% stenosed.  The left ventricular systolic function is  normal.  LV end diastolic pressure is normal.  1. Severe multivessel CAD with total occlusion of the LCx, severe in-stent restenosis in the RCA, and moderately severe proximal LAD stenosis 2. 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 Mild segmental LV contraction abnormality with preserved LVEF of 55% 4. FFR of the proximal LAD = 0.77 5. FFR of the mid-RCA = 0.75, treated successfully with cutting balloon angioplasty  Recommend: Staged PCI of the LAD - will require orbital atherectomy and stenting Should be OK for DC home tomorrow am and will schedule for outpatient PCI of the LAD with atherectomy  Recommend uninterrupted dual antiplatelet therapy with Aspirin 81mg  daily and Clopidogrel 75mg  daily for a minimum of 6 months (stable ischemic heart disease - Class I recommendation).      Assessment/Plan:  1. CAD with prior CABG in 2011 - now s/p PCI to the RCA - has residual disease in the LAD - her chest pain has improved considerably. Her next PCI is planned for 07/16/2018 with Dr. Burt Knack. She is agreeable to proceeding. The patient understands that risks include but are not limited to stroke (1 in 1000), death (1 in 69), kidney failure [usually temporary] (1 in 500), bleeding (1 in 200), allergic reaction [possibly serious] (1 in 200), and agrees to proceed. Will recheck lab today and then just prior to next PCI. Continue DAPT.   2. Prior bradycardia - improved with reduction in beta blocker - she is monitoring BP and HR at home.   3. HTN - BP ok on current regimen. No changes made.   4. HLD - on statin - may need to consider PCSK9 therapy - but her LDL on lab from last month has improved from prior - will discuss on return.   5.PAD - to follow back up with Dr. Fletcher Anon in 3 months for probable arteriogram.   Current medicines are reviewed with the patient today.  The patient does not have concerns regarding  medicines other than what has been noted above.  The following changes have been made:  See above.  Labs/ tests ordered today include:    Orders Placed This Encounter  Procedures  . Basic metabolic panel  . CBC  . Comprehensive metabolic panel  . CBC     Disposition:   FU with me post PCI in about 6 weeks.    Patient is agreeable to this plan and will call if any problems develop in the interim.   SignedTruitt Merle, NP  06/24/2018 10:23 AM  [facility] [address] [place], [place]  [address] Phone: [phone] Fax: [phone]

## 2018-06-24 NOTE — Patient Instructions (Addendum)
We will be checking the following labs today - BMET & CBC  EKG, CMET and CBC prior to August 14th   Medication Instructions:    Continue with your current medicines.     Testing/Procedures To Be Arranged:  N/A  Follow-Up:   See me in about 4 to 6 weeks for post hospital visit    Other Special Instructions:  Your provider has recommended a coronary atherectomy/stenting of the LAD  You are scheduled for a coronary atherectomy on Wednesday, August 14th at 8:30AM with Dr. Burt Knack or associate.  Please arrive at the [facility] (Main Entrance) at [facility] at [address], [place] Stay on Wednesday, August 14th at 6AM.    Special note: Every effort is made to have your procedure done on time.   Please understand that emergencies sometimes delay a scheduled   procedure.  No solid feeds after midnight on Tuesday. You may have clear liquids until 5 am on the day of your procedure.  On the morning of your procedure, take your Plavix and aspirin.   You may take your morning medications with a sip of water on the day of your procedure.  Please take a baby aspirin (81 mg) on the morning of your procedure.   Medications to [place] for a one night stay -- bring personal belongings.  Bring a current list of your medications and current insurance cards.  You MUST have a responsible person to drive you home. Someone MUST be with you the first 24 hours after you arrive home or your discharge will be delayed. Wear clothes that are easy to get on and off and wear slip on shoes.      If you need a refill on your cardiac medications before your next appointment, please call your pharmacy.   Call the [place] office at [phone] if you have any questions, problems or concerns.

## 2018-07-04 ENCOUNTER — Encounter (HOSPITAL_COMMUNITY): Payer: Self-pay

## 2018-07-04 ENCOUNTER — Ambulatory Visit (HOSPITAL_COMMUNITY): Admit: 2018-07-04 | Payer: Medicare HMO | Admitting: Cardiovascular Disease

## 2018-07-04 SURGERY — CORONARY ATHERECTOMY
Anesthesia: LOCAL

## 2018-07-11 ENCOUNTER — Ambulatory Visit (INDEPENDENT_AMBULATORY_CARE_PROVIDER_SITE_OTHER): Payer: Medicare HMO

## 2018-07-11 ENCOUNTER — Other Ambulatory Visit: Payer: Medicare HMO | Admitting: *Deleted

## 2018-07-11 ENCOUNTER — Other Ambulatory Visit: Payer: Self-pay

## 2018-07-11 VITALS — BP 130/90 | HR 48 | Ht 62.0 in | Wt 126.5 lb

## 2018-07-11 DIAGNOSIS — I495 Sick sinus syndrome: Secondary | ICD-10-CM

## 2018-07-11 DIAGNOSIS — R001 Bradycardia, unspecified: Secondary | ICD-10-CM

## 2018-07-11 DIAGNOSIS — I259 Chronic ischemic heart disease, unspecified: Secondary | ICD-10-CM | POA: Diagnosis not present

## 2018-07-11 DIAGNOSIS — Z955 Presence of coronary angioplasty implant and graft: Secondary | ICD-10-CM | POA: Diagnosis not present

## 2018-07-11 LAB — COMPREHENSIVE METABOLIC PANEL
ALT: 25 IU/L (ref 0–32)
AST: 20 IU/L (ref 0–40)
Albumin/Globulin Ratio: 2.4 — ABNORMAL HIGH (ref 1.2–2.2)
Albumin: 4.3 g/dL (ref 3.5–4.8)
Alkaline Phosphatase: 68 IU/L (ref 39–117)
BUN/Creatinine Ratio: 24 (ref 12–28)
BUN: 24 mg/dL (ref 8–27)
Bilirubin Total: 0.7 mg/dL (ref 0.0–1.2)
CO2: 23 mmol/L (ref 20–29)
Calcium: 9.2 mg/dL (ref 8.7–10.3)
Chloride: 101 mmol/L (ref 96–106)
Creatinine, Ser: 0.98 mg/dL (ref 0.57–1.00)
GFR calc Af Amer: 64 mL/min/{1.73_m2} (ref >59)
GFR calc non Af Amer: 56 mL/min/{1.73_m2} — ABNORMAL LOW (ref >59)
Globulin, Total: 1.8 g/dL (ref 1.5–4.5)
Glucose: 101 mg/dL — ABNORMAL HIGH (ref 65–99)
Potassium: 3.5 mmol/L (ref 3.5–5.2)
Sodium: 142 mmol/L (ref 134–144)
Total Protein: 6.1 g/dL (ref 6.0–8.5)

## 2018-07-11 LAB — CBC
Hematocrit: 39.6 % (ref 34.0–46.6)
Hemoglobin: 13.2 g/dL (ref 11.1–15.9)
MCH: 29.7 pg (ref 26.6–33.0)
MCHC: 33.3 g/dL (ref 31.5–35.7)
MCV: 89 fL (ref 79–97)
Platelets: 280 10*3/uL (ref 150–450)
RBC: 4.44 x10E6/uL (ref 3.77–5.28)
RDW: 13.5 % (ref 12.3–15.4)
WBC: 6.4 10*3/uL (ref 3.4–10.8)

## 2018-07-11 NOTE — Progress Notes (Signed)
1.) Reason for visit: EKG  2.) Name of MD requesting visit: Truitt Merle NP  3.) H&P: Bradycardia/CAD  4.) ROS related to problem: Bradycardia/medication change  5.) Assessment and plan per MD: Dr Angelena Form (DOD) made no changes at this time

## 2018-07-11 NOTE — Patient Instructions (Signed)
Medication Instructions:  Your physician recommends that you continue on your current medications as directed. Please refer to the Current Medication list given to you today.  -- If you need a refill on your cardiac medications before your next appointment, please call your pharmacy. --  Labwork: None ordered  Testing/Procedures: None ordered  Follow-Up: Thank you for choosing [facility]!!    Any Other Special Instructions Will Be Listed Below (If Applicable).

## 2018-07-15 ENCOUNTER — Telehealth: Payer: Self-pay | Admitting: *Deleted

## 2018-07-15 NOTE — Telephone Encounter (Signed)
Coronary Atherectomy scheduled at [facility] for: Wednesday July 16, 2018 8:30 AM Verified arrival time and place: [place] Entrance A at: 6:30 AM  No solid food after midnight prior to cath, clear liquids until 5 AM day of procedure. Verify allergies in Epic Verify no diabetes medications.  Hold: HCTZ-day before and day of procedure.   Except hold medications AM meds can be  taken pre-cath with sip of water including: ASA 81 mg Clopidogrel 75 mg  Confirm patient has responsible person to drive home post procedure and for 24 hours after you arrive home  LMTCB to discuss with patient.

## 2018-07-16 ENCOUNTER — Encounter (HOSPITAL_COMMUNITY): Payer: Self-pay | Admitting: *Deleted

## 2018-07-16 ENCOUNTER — Encounter (HOSPITAL_COMMUNITY)
Admission: RE | Disposition: A | Discharge status: Home / Self Care | Payer: Self-pay | Source: Ambulatory Visit | Attending: Cardiovascular Disease

## 2018-07-16 ENCOUNTER — Ambulatory Visit (HOSPITAL_COMMUNITY)
Admission: RE | Admit: 2018-07-16 | Discharge: 2018-07-16 | Disposition: A | Discharge status: Home / Self Care | Payer: Medicare HMO | Source: Ambulatory Visit | Attending: Cardiovascular Disease | Admitting: Cardiovascular Disease

## 2018-07-16 DIAGNOSIS — Z885 Allergy status to narcotic agent status: Secondary | ICD-10-CM | POA: Insufficient documentation

## 2018-07-16 DIAGNOSIS — Z87442 Personal history of urinary calculi: Secondary | ICD-10-CM | POA: Diagnosis not present

## 2018-07-16 DIAGNOSIS — Z9841 Cataract extraction status, right eye: Secondary | ICD-10-CM | POA: Diagnosis not present

## 2018-07-16 DIAGNOSIS — M1990 Unspecified osteoarthritis, unspecified site: Secondary | ICD-10-CM | POA: Insufficient documentation

## 2018-07-16 DIAGNOSIS — Z7982 Long term (current) use of aspirin: Secondary | ICD-10-CM | POA: Insufficient documentation

## 2018-07-16 DIAGNOSIS — E039 Hypothyroidism, unspecified: Secondary | ICD-10-CM | POA: Insufficient documentation

## 2018-07-16 DIAGNOSIS — Z79899 Other long term (current) drug therapy: Secondary | ICD-10-CM | POA: Insufficient documentation

## 2018-07-16 DIAGNOSIS — I10 Essential (primary) hypertension: Secondary | ICD-10-CM | POA: Diagnosis not present

## 2018-07-16 DIAGNOSIS — I2584 Coronary atherosclerosis due to calcified coronary lesion: Secondary | ICD-10-CM | POA: Insufficient documentation

## 2018-07-16 DIAGNOSIS — Z960 Presence of urogenital implants: Secondary | ICD-10-CM | POA: Insufficient documentation

## 2018-07-16 DIAGNOSIS — Z79890 Hormone replacement therapy (postmenopausal): Secondary | ICD-10-CM | POA: Diagnosis not present

## 2018-07-16 DIAGNOSIS — Z7902 Long term (current) use of antithrombotics/antiplatelets: Secondary | ICD-10-CM | POA: Diagnosis not present

## 2018-07-16 DIAGNOSIS — Z955 Presence of coronary angioplasty implant and graft: Secondary | ICD-10-CM | POA: Diagnosis not present

## 2018-07-16 DIAGNOSIS — E785 Hyperlipidemia, unspecified: Secondary | ICD-10-CM | POA: Insufficient documentation

## 2018-07-16 DIAGNOSIS — Z9842 Cataract extraction status, left eye: Secondary | ICD-10-CM | POA: Insufficient documentation

## 2018-07-16 DIAGNOSIS — I259 Chronic ischemic heart disease, unspecified: Secondary | ICD-10-CM

## 2018-07-16 DIAGNOSIS — I25119 Atherosclerotic heart disease of native coronary artery with unspecified angina pectoris: Secondary | ICD-10-CM | POA: Diagnosis not present

## 2018-07-16 DIAGNOSIS — Z9889 Other specified postprocedural states: Secondary | ICD-10-CM | POA: Insufficient documentation

## 2018-07-16 DIAGNOSIS — I252 Old myocardial infarction: Secondary | ICD-10-CM | POA: Diagnosis not present

## 2018-07-16 DIAGNOSIS — Z87891 Personal history of nicotine dependence: Secondary | ICD-10-CM | POA: Diagnosis not present

## 2018-07-16 DIAGNOSIS — I739 Peripheral vascular disease, unspecified: Secondary | ICD-10-CM

## 2018-07-16 HISTORY — PX: CORONARY ATHERECTOMY: CATH118238

## 2018-07-16 LAB — POCT ACTIVATED CLOTTING TIME: Activated Clotting Time: 384 seconds

## 2018-07-16 SURGERY — CORONARY ATHERECTOMY
Anesthesia: LOCAL

## 2018-07-16 MED ORDER — LIDOCAINE HCL (PF) 1 % IJ SOLN
INTRAMUSCULAR | Status: AC
  Filled 2018-07-16: qty 30

## 2018-07-16 MED ORDER — SODIUM CHLORIDE 0.9% FLUSH: 3.0000 mL | Freq: Two times a day (BID) | INTRAVENOUS | Status: DC

## 2018-07-16 MED ORDER — VIPERSLIDE LUBRICANT OPTIME
TOPICAL | Status: DC | PRN
  Administered 2018-07-16: 09:00:00 via SURGICAL_CAVITY

## 2018-07-16 MED ORDER — ONDANSETRON HCL 4 MG/2ML IJ SOLN: 4.0000 mg | Freq: Four times a day (QID) | INTRAMUSCULAR | Status: DC | PRN

## 2018-07-16 MED ORDER — NITROGLYCERIN 1 MG/10 ML FOR IR/CATH LAB
INTRA_ARTERIAL | Status: DC | PRN
  Administered 2018-07-16: 150 ug
  Administered 2018-07-16: 100 ug via INTRACORONARY
  Administered 2018-07-16: 200 ug via INTRACORONARY

## 2018-07-16 MED ORDER — SERTRALINE HCL 50 MG PO TABS: 50.0000 mg | ORAL_TABLET | Freq: Every day | ORAL | Status: DC

## 2018-07-16 MED ORDER — ATORVASTATIN CALCIUM 80 MG PO TABS: 80.0000 mg | ORAL_TABLET | Freq: Every day | ORAL | Status: DC

## 2018-07-16 MED ORDER — COLESEVELAM HCL 625 MG PO TABS: 1250.0000 mg | ORAL_TABLET | Freq: Two times a day (BID) | ORAL | Status: DC

## 2018-07-16 MED ORDER — BIVALIRUDIN TRIFLUOROACETATE 250 MG IV SOLR
INTRAVENOUS | Status: AC
  Filled 2018-07-16: qty 250

## 2018-07-16 MED ORDER — ACETAMINOPHEN 325 MG PO TABS: 650.0000 mg | ORAL_TABLET | ORAL | Status: DC | PRN

## 2018-07-16 MED ORDER — SODIUM CHLORIDE 0.9 % IV SOLN
INTRAVENOUS | Status: DC
  Administered 2018-07-16: 07:00:00 via INTRAVENOUS

## 2018-07-16 MED ORDER — CLOPIDOGREL BISULFATE 75 MG PO TABS: 75.0000 mg | ORAL_TABLET | Freq: Every day | ORAL | Status: DC

## 2018-07-16 MED ORDER — SODIUM CHLORIDE 0.9% FLUSH: 3.0000 mL | INTRAVENOUS | Status: DC | PRN

## 2018-07-16 MED ORDER — LABETALOL HCL 5 MG/ML IV SOLN: 10.0000 mg | INTRAVENOUS | Status: AC | PRN

## 2018-07-16 MED ORDER — MIDAZOLAM HCL 2 MG/2ML IJ SOLN
INTRAMUSCULAR | Status: DC | PRN
  Administered 2018-07-16: 1 mg via INTRAVENOUS
  Administered 2018-07-16: 2 mg via INTRAVENOUS

## 2018-07-16 MED ORDER — LIDOCAINE HCL (PF) 1 % IJ SOLN
INTRAMUSCULAR | Status: DC | PRN
  Administered 2018-07-16: 16 mL

## 2018-07-16 MED ORDER — BIVALIRUDIN BOLUS VIA INFUSION - CUPID
INTRAVENOUS | Status: DC | PRN
  Administered 2018-07-16: 42.75 mg via INTRAVENOUS

## 2018-07-16 MED ORDER — MIDAZOLAM HCL 2 MG/2ML IJ SOLN
INTRAMUSCULAR | Status: AC
  Filled 2018-07-16: qty 2

## 2018-07-16 MED ORDER — ASPIRIN 81 MG PO TABS: 81.0000 mg | ORAL_TABLET | Freq: Every day | ORAL | Status: DC

## 2018-07-16 MED ORDER — DIAZEPAM 5 MG PO TABS
10.0000 mg | ORAL_TABLET | ORAL | Status: AC
  Administered 2018-07-16: 10 mg via ORAL
  Filled 2018-07-16: qty 2

## 2018-07-16 MED ORDER — HEPARIN (PORCINE) IN NACL 1000-0.9 UT/500ML-% IV SOLN
INTRAVENOUS | Status: DC | PRN
  Administered 2018-07-16 (×2): 500 mL

## 2018-07-16 MED ORDER — NITROGLYCERIN 1 MG/10 ML FOR IR/CATH LAB
INTRA_ARTERIAL | Status: AC
  Filled 2018-07-16: qty 10

## 2018-07-16 MED ORDER — FENTANYL CITRATE (PF) 100 MCG/2ML IJ SOLN
INTRAMUSCULAR | Status: DC | PRN
  Administered 2018-07-16 (×2): 25 ug via INTRAVENOUS

## 2018-07-16 MED ORDER — HYDROCHLOROTHIAZIDE 25 MG PO TABS: 25.0000 mg | ORAL_TABLET | Freq: Every day | ORAL | Status: DC

## 2018-07-16 MED ORDER — SODIUM CHLORIDE 0.9 % IV SOLN: 250.0000 mL | INTRAVENOUS | Status: DC | PRN

## 2018-07-16 MED ORDER — SODIUM CHLORIDE 0.9 % IV SOLN
INTRAVENOUS | Status: DC | PRN
  Administered 2018-07-16: 1.75 mg/kg/h via INTRAVENOUS

## 2018-07-16 MED ORDER — HYDRALAZINE HCL 20 MG/ML IJ SOLN: 5.0000 mg | INTRAMUSCULAR | Status: AC | PRN

## 2018-07-16 MED ORDER — WOMENS MULTIVITAMIN PO TABS: ORAL_TABLET | ORAL | Status: DC

## 2018-07-16 MED ORDER — SODIUM CHLORIDE 0.9 % WEIGHT BASED INFUSION: 1.0000 mL/kg/h | INTRAVENOUS | Status: DC

## 2018-07-16 MED ORDER — HEPARIN (PORCINE) IN NACL 1000-0.9 UT/500ML-% IV SOLN
INTRAVENOUS | Status: AC
  Filled 2018-07-16: qty 1000

## 2018-07-16 MED ORDER — NITROGLYCERIN 0.4 MG SL SUBL: 0.4000 mg | SUBLINGUAL_TABLET | SUBLINGUAL | Status: DC | PRN

## 2018-07-16 MED ORDER — IOHEXOL 350 MG/ML SOLN
INTRAVENOUS | Status: DC | PRN
  Administered 2018-07-16: 95 mL via INTRAVENOUS

## 2018-07-16 MED ORDER — LEVOTHYROXINE SODIUM 100 MCG PO TABS: 100.0000 ug | ORAL_TABLET | Freq: Every day | ORAL | Status: DC

## 2018-07-16 MED ORDER — ASPIRIN 81 MG PO CHEW: 81.0000 mg | CHEWABLE_TABLET | ORAL | Status: DC

## 2018-07-16 MED ORDER — FENTANYL CITRATE (PF) 100 MCG/2ML IJ SOLN
INTRAMUSCULAR | Status: AC
  Filled 2018-07-16: qty 2

## 2018-07-16 MED ORDER — CLOPIDOGREL BISULFATE 75 MG PO TABS: 75.0000 mg | ORAL_TABLET | ORAL | Status: DC

## 2018-07-16 SURGICAL SUPPLY — 19 items
BALLN SAPPHIRE ~~LOC~~ 2.5X15 (BALLOONS) ×1 IMPLANT
BALLN SAPPHIRE ~~LOC~~ 3.0X12 (BALLOONS) ×1 IMPLANT
CATH VISTA GUIDE 6FR XBLAD3.5 (CATHETERS) ×1 IMPLANT
CROWN DIAMONDBACK CLASSIC 1.25 (BURR) ×1 IMPLANT
DEVICE CLOSURE PERCLS PRGLD 6F (VASCULAR PRODUCTS) IMPLANT
ELECT DEFIB PAD ADLT CADENCE (PAD) ×1 IMPLANT
KIT ENCORE 26 ADVANTAGE (KITS) ×1 IMPLANT
KIT HEART LEFT (KITS) ×2 IMPLANT
LUBRICANT VIPERSLIDE CORONARY (MISCELLANEOUS) ×1 IMPLANT
PACK CARDIAC CATHETERIZATION (CUSTOM PROCEDURE TRAY) ×2 IMPLANT
PERCLOSE PROGLIDE 6F (VASCULAR PRODUCTS) ×2
SHEATH PINNACLE 6F 10CM (SHEATH) ×1 IMPLANT
SHEATH PROBE COVER 6X72 (BAG) ×1 IMPLANT
STENT SYNERGY DES 2.75X20 (Permanent Stent) ×1 IMPLANT
TRANSDUCER W/STOPCOCK (MISCELLANEOUS) ×2 IMPLANT
TUBING CIL FLEX 10 FLL-RA (TUBING) ×2 IMPLANT
WIRE COUGAR XT STRL 190CM (WIRE) ×1 IMPLANT
WIRE EMERALD 3MM-J .035X150CM (WIRE) ×1 IMPLANT
WIRE VIPER ADVANCE COR .012TIP (WIRE) ×1 IMPLANT

## 2018-07-16 NOTE — Discharge Summary (Signed)
Discharge Summary/SAME DAY PCI    Patient ID: Erica [name],  MRN: [id number], DOB/AGE: 09-Jul-1941 77 y.o.  Admit date: 07/16/2018 Discharge date: 07/16/2018  Primary Care Provider: Mayra Neer Primary Cardiologist: Dr. Burt Knack  Discharge Diagnoses    Active Problems:   Coronary artery disease involving native coronary artery with angina pectoris [facility])  Allergies Allergies  Allergen Reactions  . Morphine And Related Nausea And Vomiting    Diagnostic Studies/Procedures    Cath: 07/16/18  Successful PCI of the LAD using orbital atherectomy and stenting with a 2.75x20 mm Synergy DES  Recommend uninterrupted dual antiplatelet therapy with Aspirin 81mg  daily and Clopidogrel 75mg  daily for a minimum of 12 months with proximal LAD stenting and recent RCA angioplasty of severe in-stent restenosis. _____________   History of Present Illness     77 y.o. female with a history of CAD with remoteCABG in 2011 with grafting of the LIMA to LAD, saphenous vein graft to PDA and PLA, and saphenous vein graft to OM- per Dr. Cyndia Bent.Her other issues included carotid disease, HLD and PAD - followed by Dr. Fletcher Anon.   Last Myoview was in January of 2018 - this demonstrated normal perfusion and normal LV function.Saw Dr. Burt Knack in December - 2 episodes of chest pain but felt to be stable.   She was seen back in 6/19 as a work in - her chest pain had progressed. Her claudication was worse. She was referred for repeat cath.  She underwent cardiac catheterization with successful PCI of the RCA without complication. She will be continued on DAPT with aspirin and Plavix. There is a residual lesion in the proximal LAD that will require orbital atherectomy. She was bradycardic on telemetry and her atenolol was reduced to 12.5 mg daily.She did have continued patency of the SVG-OM, total occlusion of the SVG-PDA/PLA and atresia of the LIMA-LAD noted.   She saw Dr. Fletcher Anon a week prior to her  office visit with Cecille Rubin for her PAD/claudication - he suspected that she will require angiography and revascularization on the left side given that her symptoms are now lifestyle limiting. However, this was not urgent and should be done after finishing coronary revascularization. She was scheduled for LAD atherectomy and stent placement at this appt.   At her office appt she was feeling much better. She had only had one spell of chest pain requiring NTG since her PCI. She was anxious about her upcoming procedure due to prior complication with atherectomy. BP and HR diary from home are pretty stable. Her beta blocker was cut back during her last admission and she was not symptomatic. Planned for outpatient cath to address LAD lesion.   Hospital Course     Underwent cath noted above with successful PCI of the LAD using orbital atherectomy and stenting. Plan to continue on DAPT with ASA/plavix. Other home medications were continued the same. See by cardiac rehab while in short stay and provided education. Instructions/precautions given prior to discharge.   [name] was seen by Dr. Burt Knack and determined stable for discharge home. Follow up in the office has been arranged. Medications are listed below.   _____________  Discharge Vitals Blood pressure (!) 136/33, pulse (!) 43, temperature 98.1 F (36.7 C), temperature source Oral, resp. rate 11, height 5\' 2"  (1.575 m), weight 57 kg, SpO2 98 %.  Filed Weights   07/16/18 0626  Weight: 57 kg    Labs & Radiologic Studies    CBC No results for input(s):  WBC, NEUTROABS, HGB, HCT, MCV, PLT in the last 72 hours. Basic Metabolic Panel No results for input(s): NA, K, CL, CO2, GLUCOSE, BUN, CREATININE, CALCIUM, MG, PHOS in the last 72 hours. Liver Function Tests No results for input(s): AST, ALT, ALKPHOS, BILITOT, PROT, ALBUMIN in the last 72 hours. No results for input(s): LIPASE, AMYLASE in the last 72 hours. Cardiac Enzymes No results for  input(s): CKTOTAL, CKMB, CKMBINDEX, TROPONINI in the last 72 hours. BNP Invalid input(s): POCBNP D-Dimer No results for input(s): DDIMER in the last 72 hours. Hemoglobin A1C No results for input(s): HGBA1C in the last 72 hours. Fasting Lipid Panel No results for input(s): CHOL, HDL, LDLCALC, TRIG, CHOLHDL, LDLDIRECT in the last 72 hours. Thyroid Function Tests No results for input(s): TSH, T4TOTAL, T3FREE, THYROIDAB in the last 72 hours.  Invalid input(s): FREET3 _____________  No results found. Disposition   Pt is being discharged home today in good condition.  Follow-up Plans & Appointments    Follow-up Information    Burtis Junes, NP Follow up on 07/29/2018.   Specialties:  Nurse Practitioner, Interventional Cardiology, Cardiology, Radiology Why:  at 11:30am for your follow up appt.  Contact information: [place]. [address] [place] [address] [phone]          Discharge Instructions    Amb Referral to Cardiac Rehabilitation   Complete by:  As directed    Diagnosis:  Coronary Stents      Discharge Medications     Medication List    TAKE these medications   aspirin 81 MG tablet Take 1 tablet (81 mg total) by mouth daily.   atenolol 25 MG tablet Commonly known as:  TENORMIN Take 0.5 tablets (12.5 mg total) by mouth daily.   atorvastatin 80 MG tablet Commonly known as:  LIPITOR Take 80 mg by mouth daily.   clopidogrel 75 MG tablet Commonly known as:  PLAVIX Take 1 tablet (75 mg total) by mouth daily with breakfast.   colesevelam 625 MG tablet Commonly known as:  WELCHOL Take 1,250 mg by mouth 2 (two) times daily with a meal.   hydrochlorothiazide 25 MG tablet Commonly known as:  HYDRODIURIL Take 25 mg by mouth daily.   levothyroxine 100 MCG tablet Commonly known as:  SYNTHROID, LEVOTHROID Take 100 mcg by mouth daily.   nitroGLYCERIN 0.4 MG SL tablet Commonly known as:  NITROSTAT Place 1 tablet (0.4 mg total) under the  tongue every 5 (five) minutes as needed for chest pain.   sertraline 50 MG tablet Commonly known as:  ZOLOFT Take 50 mg by mouth daily.   WOMENS MULTIVITAMIN PO Take 1 tablet by mouth 3 (three) times a week.        Acute coronary syndrome (MI, NSTEMI, STEMI, etc) this admission?: No.   Outstanding Labs/Studies   N/a   Duration of Discharge Encounter   Greater than 30 minutes including physician time.  Signed, Reino Bellis NP-C 07/16/2018, 2:44 PM

## 2018-07-16 NOTE — Discharge Instructions (Signed)
Angiogram, Care After This sheet gives you information about how to care for yourself after your procedure. Your health care provider may also give you more specific instructions. If you have problems or questions, contact your health care provider. What can I expect after the procedure? After the procedure, it is common to have bruising and tenderness at the catheter insertion area. Follow these instructions at home: Insertion site care Follow instructions from your health care provider about how to take care of your insertion site. Make sure you: Wash your hands with soap and water before you change your bandage (dressing). If soap and water are not available, use hand sanitizer. Change your dressing as told by your health care provider. Leave stitches (sutures), skin glue, or adhesive strips in place. These skin closures may need to stay in place for 2 weeks or longer. If adhesive strip edges start to loosen and curl up, you may trim the loose edges. Do not remove adhesive strips completely unless your health care provider tells you to do that. Do not take baths, swim, or use a hot tub until your health care provider approves. You may shower 24-48 hours after the procedure or as told by your health care provider. Gently wash the site with plain soap and water. Pat the area dry with a clean towel. Do not rub the site. This may cause bleeding. Do not apply powder or lotion to the site. Keep the site clean and dry. Check your insertion site every day for signs of infection. Check for: Redness, swelling, or pain. Fluid or blood. Warmth. Pus or a bad smell. Activity Rest as told by your health care provider, usually for 1-2 days. Do not lift anything that is heavier than 10 lbs. (4.5 kg) or as told by your health care provider. Do not drive for 24 hours if you were given a medicine to help you relax (sedative). Do not drive or use heavy machinery while taking prescription pain  medicine. General instructions Return to your normal activities as told by your health care provider, usually in about a week. Ask your health care provider what activities are safe for you. If the catheter site starts bleeding, lie flat and put pressure on the site. If the bleeding does not stop, get help right away. This is a medical emergency. Drink enough fluid to keep your urine clear or pale yellow. This helps flush the contrast dye from your body. Take over-the-counter and prescription medicines only as told by your health care provider. Keep all follow-up visits as told by your health care provider. This is important. Contact a health care provider if: You have a fever or chills. You have redness, swelling, or pain around your insertion site. You have fluid or blood coming from your insertion site. The insertion site feels warm to the touch. You have pus or a bad smell coming from your insertion site. You have bruising around the insertion site. You notice blood collecting in the tissue around the catheter site (hematoma). The hematoma may be painful to the touch. Get help right away if: You have severe pain at the catheter insertion area. The catheter insertion area swells very fast. The catheter insertion area is bleeding, and the bleeding does not stop when you hold steady pressure on the area. The area near or just beyond the catheter insertion site becomes pale, cool, tingly, or numb. These symptoms may represent a serious problem that is an emergency. Do not wait to see if the symptoms  will go away. Get medical help right away. Call your local emergency services (911 in the [place] Do not drive yourself to the hospital. Summary After the procedure, it is common to have bruising and tenderness at the catheter insertion area. After the procedure, it is important to rest and drink plenty of fluids. Do not take baths, swim, or use a hot tub until your health care provider says it is  okay to do so. You may shower 24-48 hours after the procedure or as told by your health care provider. If the catheter site starts bleeding, lie flat and put pressure on the site. If the bleeding does not stop, get help right away. This is a medical emergency. This information is not intended to replace advice given to you by your health care provider. Make sure you discuss any questions you have with your health care provider. Document Released: 06/07/2005 Document Revised: 10/24/2016 Document Reviewed: 10/24/2016 Elsevier Interactive Patient Education  2018 [place]. Femoral Site Care Refer to this sheet in the next few weeks. These instructions provide you with information about caring for yourself after your procedure. Your health care provider may also give you more specific instructions. Your treatment has been planned according to current medical practices, but problems sometimes occur. Call your health care provider if you have any problems or questions after your procedure. What can I expect after the procedure? After your procedure, it is typical to have the following:  Bruising at the site that usually fades within 1-2 weeks.  Blood collecting in the tissue (hematoma) that may be painful to the touch. It should usually decrease in size and tenderness within 1-2 weeks.  Follow these instructions at home:  Take medicines only as directed by your health care provider.  You may shower 24-48 hours after the procedure or as directed by your health care provider. Remove the bandage (dressing) and gently wash the site with plain soap and water. Pat the area dry with a clean towel. Do not rub the site, because this may cause bleeding.  Do not take baths, swim, or use a hot tub until your health care provider approves.  Check your insertion site every day for redness, swelling, or drainage.  Do not apply powder or lotion to the site.  Limit use of stairs to twice a day for the first  2-3 days or as directed by your health care provider.  Do not squat for the first 2-3 days or as directed by your health care provider.  Do not lift over 10 lb (4.5 kg) for 5 days after your procedure or as directed by your health care provider.  Ask your health care provider when it is okay to: ? Return to work or school. ? Resume usual physical activities or sports. ? Resume sexual activity.  Do not drive home if you are discharged the same day as the procedure. Have someone else drive you.  You may drive 24 hours after the procedure unless otherwise instructed by your health care provider.  Do not operate machinery or power tools for 24 hours after the procedure or as directed by your health care provider.  If your procedure was done as an outpatient procedure, which means that you went home the same day as your procedure, a responsible adult should be with you for the first 24 hours after you arrive home.  Keep all follow-up visits as directed by your health care provider. This is important. Contact a health care provider if:  You have a fever.  You have chills.  You have increased bleeding from the site. Hold pressure on the site. Call 911 Get help right away if:  You have unusual pain at the site.  You have redness, warmth, or swelling at the site.  You have drainage (other than a small amount of blood on the dressing) from the site.  The site is bleeding, and the bleeding does not stop after 30 minutes of holding steady pressure on the site.  Your leg or foot becomes pale, cool, tingly, or numb. This information is not intended to replace advice given to you by your health care provider. Make sure you discuss any questions you have with your health care provider. Document Released: 07/23/2014 Document Revised: 04/26/2016 Document Reviewed: 06/08/2014 Elsevier Interactive Patient Education  [employer].

## 2018-07-16 NOTE — Interval H&P Note (Signed)
  Cath Lab Visit (complete for each Cath Lab visit)  Clinical Evaluation Leading to the Procedure:   ACS: No.  Non-ACS:    Anginal Classification: CCS II  Anti-ischemic medical therapy: Maximal Therapy (2 or more classes of medications)  Non-Invasive Test Results: No non-invasive testing performed  Prior CABG: Previous CABG      History and Physical Interval Note:  07/16/2018 8:38 AM  Erica [name]  has presented today for surgery, with the diagnosis of CAD  The various methods of treatment have been discussed with the patient and family. After consideration of risks, benefits and other options for treatment, the patient has consented to  Procedure(s): CORONARY ATHERECTOMY (N/A) as a surgical intervention .  The patient's history has been reviewed, patient examined, no change in status, stable for surgery.  I have reviewed the patient's chart and labs.  Questions were answered to the patient's satisfaction.     [name]

## 2018-07-16 NOTE — Telephone Encounter (Signed)
Pt at hospital for procedure. °

## 2018-07-16 NOTE — Progress Notes (Signed)
CARDIAC REHAB PHASE I   Completed education with pt and daughter in short stay. Pt drowsy.  Reviewed importance of Plavix, ASA, statin and NTG. Stent card given to pt. Reviewed risk factors, groin site care. Pt given heart healthy diet. Educated on restrictions and exercise guidelines. Will refer to CRP II [place].   [id number] Rufina Falco, RN BSN 07/16/2018 11:35 AM

## 2018-07-17 ENCOUNTER — Encounter (HOSPITAL_COMMUNITY): Payer: Self-pay | Admitting: Cardiovascular Disease

## 2018-07-18 ENCOUNTER — Telehealth (HOSPITAL_COMMUNITY): Payer: Self-pay

## 2018-07-18 NOTE — Telephone Encounter (Signed)
Pt insurance is active and benefits verified through [facility]. Co-pay $10.00, DED $0.00/$0.00 met, out of pocket $3,400.00/$790.00 met, co-insurance 0%. No pre-authorization, certification and determination. Passport, 07/18/18 @ 10:23AM, [id number]  Will contact patient to see if she is interested in the Cardiac Rehab Program. If interested, patient will need to complete follow up appt. Once completed, patient will be contacted for scheduling upon review by the RN Navigator.

## 2018-07-29 ENCOUNTER — Encounter: Payer: Self-pay | Admitting: Nurse Practitioner

## 2018-07-29 ENCOUNTER — Ambulatory Visit: Payer: Medicare HMO | Admitting: Nurse Practitioner

## 2018-07-29 VITALS — BP 150/100 | HR 49 | Ht 62.0 in | Wt 128.4 lb

## 2018-07-29 DIAGNOSIS — I259 Chronic ischemic heart disease, unspecified: Secondary | ICD-10-CM | POA: Diagnosis not present

## 2018-07-29 MED ORDER — AMLODIPINE BESYLATE 2.5 MG PO TABS: 2.5000 mg | ORAL_TABLET | Freq: Every day | ORAL | 3 refills | Status: DC

## 2018-07-29 NOTE — Patient Instructions (Addendum)
We will be checking the following labs today - BMET & CBC   Medication Instructions:    Continue with your current medicines.   I am adding Norvasc 2.5 mg to take only at night    Testing/Procedures To Be Arranged:  N/A  Follow-Up:   See Dr. Burt Knack in December    Other Special Instructions:   Continue to monitor your BP for [place]    If you need a refill on your cardiac medications before your next appointment, please call your pharmacy.   Call the [place] office at [phone] if you have any questions, problems or concerns.

## 2018-07-29 NOTE — Progress Notes (Signed)
CARDIOLOGY OFFICE NOTE  Date:  07/29/2018    Erica [name] Date of Birth: 1941/09/03 Medical Record #[id number]  PCP:  Mayra Neer, MD  Cardiologist:  Jerel Shepherd    Chief Complaint  Patient presents with  . Coronary Artery Disease    Post PCI - seen for Dr. Burt Knack    History of Present Illness: Erica [name] is a 77 y.o. female who presents today for a post PCI visit. Seen for Dr. Burt Knack.  She sees Dr. Fletcher Anon for her PAD.   She has a history of CAD with remoteCABG in 2011 with grafting of the LIMA to LAD, saphenous vein graft to PDA and PLA, and saphenous vein graft to OM- per Dr. Cyndia Bent.Her other issues include carotid disease, HLD and PAD - followed by Dr. Fletcher Anon.   Last Myoview was in January of 2018 - this demonstrated normal perfusion and normal LV function.Saw Dr. Burt Knack in December - 2 episodes of chest pain but felt to be stable. This progressed when I saw her as a work in back in June - was recathed.   She underwent cardiac catheterization with successful PCI of the RCA without complication. She will be continued on DAPT with aspirin and Plavix. There was a residual lesion in the proximal LAD that would require orbital atherectomy. She was bradycardic on telemetry and her atenolol was reduced to 12.5 mg daily.She did have continued patency of the SVG-OM, total occlusion of the SVG-PDA/PLA and atresia of the LIMA-LAD noted.   She has seen Dr. Fletcher Anon last week for her PAD/claudication - he "suspects that she will require angiography and revascularization on the left side given that her symptoms are now lifestyle limiting. However, this is not urgent and should be done after finishing coronary revascularization.   She then underwent LAD atherectomy and stent placement with a 2.75 x 20 mm Synergy DES with Dr. Burt Knack earlier this month.    Comes in today. Here alone. She is doing well. No chest pain. Breathing is ok. Groin is fine. She has noted more  issues with her BP - typically running higher in the AM - then goes down. HR is lower in the AM and then comes up by the afternoon - she is not symptomatic. No dizziness or lightheadedness. No syncope.   Past Medical History:  Diagnosis Date  . Anxiety   . Chronic mid back pain   . Coronary artery disease    status post multiple percutaneous interventions  . Depression   . History of kidney stones 1990s X 1; 2016   . Hyperlipidemia   . Hypertension   . Hypothyroidism   . Myocardial infarction ([place]) 01/2010  . Osteoarthritis    "hands, back" (06/04/2018)    Past Surgical History:  Procedure Laterality Date  . CARDIAC CATHETERIZATION  12-2008  . CATARACT EXTRACTION W/ INTRAOCULAR LENS IMPLANT Bilateral   . CORONARY ANGIOPLASTY WITH STENT PLACEMENT  2006    right coronary artery with drug-eluting stent  . CORONARY ANGIOPLASTY WITH STENT PLACEMENT  1998    bare-metal stent to the right coronary artery  . CORONARY ANGIOPLASTY WITH STENT PLACEMENT  2008   drug-eluting stent placed in the left circumflex  . CORONARY ARTERY BYPASS GRAFT  01-25-10   CABG X4  . CORONARY ATHERECTOMY N/A 07/16/2018   Procedure: CORONARY ATHERECTOMY;  Surgeon: [name], MD;  Location: [facility];  Service: Cardiovascular;  Laterality: N/A;  . CORONARY BALLOON ANGIOPLASTY  06/04/2018  cutting balloon  . CORONARY BALLOON ANGIOPLASTY N/A 06/04/2018   Procedure: CORONARY BALLOON ANGIOPLASTY;  Surgeon: [name], MD;  Location: [facility] [facility];  Service: Cardiovascular;  Laterality: N/A;  . CYSTOSCOPY WITH RETROGRADE PYELOGRAM, URETEROSCOPY AND STENT PLACEMENT Right 10/18/2015   Procedure: CYSTOSCOPY WITH RETROGRADE PYELOGRAM,  AND STENT PLACEMENT;  Surgeon: Festus Aloe, MD;  Location: [facility];  Service: Urology;  Laterality: Right;  . CYSTOSCOPY/URETEROSCOPY/HOLMIUM LASER/STENT PLACEMENT Right 11/29/2015   Procedure: RIGHT URETEROSCOPY/RIGHT RETROGRADE PYELOGRAM/ RIGHT STENT REPLACEMENT;   Surgeon: Festus Aloe, MD;  [facility];  Service: Urology;  Laterality: Right;  . FOOT SURGERY Bilateral 2008-2016   "bones shortened"  . INTRAVASCULAR PRESSURE WIRE/FFR STUDY N/A 06/04/2018   Procedure: INTRAVASCULAR PRESSURE WIRE/FFR STUDY;  Surgeon: [name], MD;  Location: [facility];  Service: Cardiovascular;  Laterality: N/A;  . LEFT HEART CATH AND CORS/GRAFTS ANGIOGRAPHY N/A 06/04/2018   Procedure: LEFT HEART CATH AND CORS/GRAFTS ANGIOGRAPHY;  Surgeon: [name], MD;  Location: [place];  Service: Cardiovascular;  Laterality: N/A;  . ULTRASOUND GUIDANCE FOR VASCULAR ACCESS  06/04/2018   Procedure: Ultrasound Guidance For Vascular Access;  Surgeon: [name], MD;  Location: [facility];  Service: Cardiovascular;;     Medications: Current Meds  Medication Sig  . aspirin 81 MG tablet Take 1 tablet (81 mg total) by mouth daily.  Marland Kitchen atenolol (TENORMIN) 25 MG tablet Take 0.5 tablets (12.5 mg total) by mouth daily.  Marland Kitchen atorvastatin (LIPITOR) 80 MG tablet Take 80 mg by mouth daily.  . clopidogrel (PLAVIX) 75 MG tablet Take 1 tablet (75 mg total) by mouth daily with breakfast.  . colesevelam (WELCHOL) 625 MG tablet Take 1,250 mg by mouth 2 (two) times daily with a meal.  . hydrochlorothiazide (HYDRODIURIL) 25 MG tablet Take 25 mg by mouth daily.  Marland Kitchen levothyroxine (SYNTHROID, LEVOTHROID) 100 MCG tablet Take 100 mcg by mouth daily.  . Multiple Vitamins-Minerals (WOMENS MULTIVITAMIN PO) Take 1 tablet by mouth 3 (three) times a week.  . nitroGLYCERIN (NITROSTAT) 0.4 MG SL tablet Place 1 tablet (0.4 mg total) under the tongue every 5 (five) minutes as needed for chest pain.  Marland Kitchen sertraline (ZOLOFT) 50 MG tablet Take 50 mg by mouth daily.      Allergies: Allergies  Allergen Reactions  . Morphine And Related Nausea And Vomiting    Social History: The patient  reports that she quit smoking about 21 years ago. Her smoking use  included cigarettes. She has a 37.00 pack-year smoking history. She has never used smokeless tobacco. She reports that she drank alcohol. She reports that she does not use drugs.   Family History: The patient's family history includes Other in her unknown relative; Other (age of onset: 63) in her mother.   Review of Systems: Please see the history of present illness.   Otherwise, the review of systems is positive for none.   All other systems are reviewed and negative.   Physical Exam: VS:  BP (!) 150/100 (BP Location: Left Arm, Patient Position: Sitting, Cuff Size: Normal)   Pulse (!) 49   Ht 5\' 2"  (1.575 m)   Wt 128 lb 6.4 oz (58.2 kg)   SpO2 100% Comment: at rest  BMI 23.48 kg/m  .  BMI Body mass index is 23.48 kg/m.  Wt Readings from Last 3 Encounters:  07/29/18 128 lb 6.4 oz (58.2 kg)  07/16/18 125 lb 10.6 oz (57 kg)  07/11/18 126 lb 8 oz (  57.4 kg)    General: Pleasant. Well developed, well nourished and in no acute distress.  She looks younger than her stated age.  HEENT: Normal.  Neck: Supple, no JVD, carotid bruits, or masses noted.  Cardiac: Regular rate and rhythm. No murmurs, rubs, or gallops. No edema.  Respiratory:  Lungs are clear to auscultation bilaterally with normal work of breathing.  GI: Soft and nontender.  MS: No deformity or atrophy. Gait and ROM intact.  Skin: Warm and dry. Color is normal.  Neuro:  Strength and sensation are intact and no gross focal deficits noted.  Psych: Alert, appropriate and with normal affect.   LABORATORY DATA:  EKG:  EKG is not ordered today.   Lab Results  Component Value Date   WBC 6.4 07/11/2018   HGB 13.2 07/11/2018   HCT 39.6 07/11/2018   PLT 280 07/11/2018   GLUCOSE 101 (H) 07/11/2018   CHOL  01/09/2010    131        ATP III CLASSIFICATION:  <200     mg/dL   Desirable  200-239  mg/dL   Borderline High  >=240    mg/dL   High          TRIG 87 01/09/2010   HDL 48 01/09/2010   LDLCALC  01/09/2010    66          Total Cholesterol/HDL:CHD Risk Coronary Heart Disease Risk Table                     Men   Women  1/2 Average Risk   3.4   3.3  Average Risk       5.0   4.4  2 X Average Risk   9.6   7.1  3 X Average Risk  23.4   11.0        Use the calculated Patient Ratio above and the CHD Risk Table to determine the patient's CHD Risk.        ATP III CLASSIFICATION (LDL):  <100     mg/dL   Optimal  100-129  mg/dL   Near or Above                    Optimal  130-159  mg/dL   Borderline  160-189  mg/dL   High  >190     mg/dL   Very High   ALT 25 07/11/2018   AST 20 07/11/2018   NA 142 07/11/2018   K 3.5 07/11/2018   CL 101 07/11/2018   CREATININE 0.98 07/11/2018   BUN 24 07/11/2018   CO2 23 07/11/2018   TSH 1.488 Test methodology is 3rd generation TSH 01/08/2010   INR 1.33 10/18/2015   HGBA1C (H) 01/23/2010    6.2 (NOTE) The ADA recommends the following therapeutic goal for glycemic control related to Hgb A1c measurement: Goal of therapy: <6.5 Hgb A1c  Reference: American Diabetes Association: Clinical Practice Recommendations 2010, Diabetes Care, 2010, 33: (Suppl  1).       BNP (last 3 results) No results for input(s): BNP in the last 8760 hours.  ProBNP (last 3 results) No results for input(s): PROBNP in the last 8760 hours.   Other Studies Reviewed Today:  CORONARY ATHERECTOMY 07/2018  Conclusion   Successful PCI of the LAD using orbital atherectomy and stenting with a 2.75x20 mm Synergy DES  Recommend uninterrupted dual antiplatelet therapy with Aspirin 81mg  daily and Clopidogrel 75mg  daily for a minimum of 12 months with  proximal LAD stenting and recent RCA angioplasty of severe in-stent restenosis.   Cath: 06/04/18  Conclusion     Ost Cx to Prox Cx lesion is 100% stenosed.  SVG and is normal in caliber.  LIMA.  Ost LAD to Prox LAD lesion is 75% stenosed.  Mid RCA lesion is 75% stenosed.  Scoring balloon angioplasty was performed using a BALLOON WOLVERINE  2.50X10.  Post intervention, there is a 10% residual stenosis.  Ost RCA to Prox RCA lesion is 40% stenosed.  Seq SVG-.  Origin lesion before RPDA is 100% stenosed.  The left ventricular systolic function is normal.  LV end diastolic pressure is normal.  1. Severe multivessel CAD with total occlusion of the LCx, severe in-stent restenosis in the RCA, and moderately severe proximal LAD stenosis 2. 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 Mild segmental LV contraction abnormality with preserved LVEF of 55% 4. FFR of the proximal LAD = 0.77 5. FFR of the mid-RCA = 0.75, treated successfully with cutting balloon angioplasty  Recommend: Staged PCI of the LAD - will require orbital atherectomy and stenting Should be OK for DC home tomorrow am and will schedule for outpatient PCI of the LAD with atherectomy  Recommend uninterrupted dual antiplatelet therapy with Aspirin 81mg  daily and Clopidogrel 75mg  daily for a minimum of 6 months (stable ischemic heart disease - Class I recommendation).    Echo Study Conclusions 2016  - Left ventricle: The cavity size was normal. Wall thickness was   increased in a pattern of mild LVH. Systolic function was   vigorous. The estimated ejection fraction was in the range of 60%   to 65%. Wall motion was normal; there were no regional wall   motion abnormalities. Features are consistent with a pseudonormal   left ventricular filling pattern, with concomitant abnormal   relaxation and increased filling pressure (grade 2 diastolic   dysfunction). - Mitral valve: There was mild regurgitation. - Left atrium: The atrium was moderately to severely dilated. - Right ventricle: The cavity size was normal. Wall thickness was   normal. Systolic function was mildly reduced. - Right atrium: The atrium was mildly dilated. Central venous   pressure (est): 8 mm Hg. - Atrial septum: No  defect or patent foramen ovale was identified. - Tricuspid valve: There was mild-moderate regurgitation. - Pulmonary arteries: Systolic pressure was increased. PA peak   pressure: 49 mm Hg (S). - Inferior vena cava: The vessel was dilated. The respirophasic   diameter changes were in the normal range (>= 50%).   Assessment/Plan:  1. CAD with prior CABG in 2011 - with s/p PCI to the RCA - and now s/p orbital athrectomy and DES to the LAD - she is doing well. Her symptoms have resolved.   2. Prior bradycardia - noted at home - she is not symptomatic.   3. HTN - BP higher in the AM and not at goal - will add Norvasc 2.5 mg to take at night.   4. HLD - on statin - may need to consider PCSK9 therapy - but her LDL on lab from last month has improved from prior   5.PAD - to follow back up with Dr. Fletcher Anon in a few weeks for probable arteriogram.   Current medicines are reviewed with the patient today.  The patient does not have concerns regarding medicines other than what has been noted above.  The following changes have been made:  See above.  Labs/ tests ordered today include:    Orders Placed This Encounter  Procedures  . Basic metabolic panel  . CBC     Disposition:   FU with Dr. Burt Knack in December. I am happy to see her back as needed.   Patient is agreeable to this plan and will call if any problems develop in the interim.   SignedTruitt Merle, NP  07/29/2018 12:35 PM  [facility] [address] [place], [place]  [address] Phone: [phone] Fax: [phone]

## 2018-07-30 LAB — CBC
Hematocrit: 40.9 % (ref 34.0–46.6)
Hemoglobin: 13.8 g/dL (ref 11.1–15.9)
MCH: 30.2 pg (ref 26.6–33.0)
MCHC: 33.7 g/dL (ref 31.5–35.7)
MCV: 90 fL (ref 79–97)
Platelets: 275 10*3/uL (ref 150–450)
RBC: 4.57 x10E6/uL (ref 3.77–5.28)
RDW: 13.3 % (ref 12.3–15.4)
WBC: 6.2 10*3/uL (ref 3.4–10.8)

## 2018-07-30 LAB — BASIC METABOLIC PANEL
BUN/Creatinine Ratio: 17 (ref 12–28)
BUN: 15 mg/dL (ref 8–27)
CO2: 25 mmol/L (ref 20–29)
Calcium: 9.4 mg/dL (ref 8.7–10.3)
Chloride: 105 mmol/L (ref 96–106)
Creatinine, Ser: 0.88 mg/dL (ref 0.57–1.00)
GFR calc Af Amer: 73 mL/min/{1.73_m2} (ref >59)
GFR calc non Af Amer: 64 mL/min/{1.73_m2} (ref >59)
Glucose: 107 mg/dL — ABNORMAL HIGH (ref 65–99)
Potassium: 3.9 mmol/L (ref 3.5–5.2)
Sodium: 144 mmol/L (ref 134–144)

## 2018-07-31 ENCOUNTER — Telehealth (HOSPITAL_COMMUNITY): Payer: Self-pay

## 2018-07-31 NOTE — Telephone Encounter (Signed)
Attempted to call patient in regards to Cardiac Rehab - LM on VM 

## 2018-08-06 ENCOUNTER — Telehealth (HOSPITAL_COMMUNITY): Payer: Self-pay

## 2018-08-06 NOTE — Telephone Encounter (Signed)
Called patient in regards to CR, pt stated that right now is not a good time. She is going back and forward from her place and her daughtesr place, bc her daughter is about to have surgery. States she will give [place] a call when she is ready to start the program.  Closed referral.

## 2018-08-13 ENCOUNTER — Ambulatory Visit: Payer: Medicare HMO | Admitting: Nurse Practitioner

## 2018-09-19 DIAGNOSIS — R7301 Impaired fasting glucose: Secondary | ICD-10-CM | POA: Diagnosis not present

## 2018-09-19 DIAGNOSIS — I739 Peripheral vascular disease, unspecified: Secondary | ICD-10-CM | POA: Diagnosis not present

## 2018-09-19 DIAGNOSIS — L723 Sebaceous cyst: Secondary | ICD-10-CM | POA: Diagnosis not present

## 2018-09-19 DIAGNOSIS — Z23 Encounter for immunization: Secondary | ICD-10-CM | POA: Diagnosis not present

## 2018-09-19 DIAGNOSIS — E782 Mixed hyperlipidemia: Secondary | ICD-10-CM | POA: Diagnosis not present

## 2018-09-19 DIAGNOSIS — I2510 Atherosclerotic heart disease of native coronary artery without angina pectoris: Secondary | ICD-10-CM | POA: Diagnosis not present

## 2018-09-19 DIAGNOSIS — I119 Hypertensive heart disease without heart failure: Secondary | ICD-10-CM | POA: Diagnosis not present

## 2018-09-19 DIAGNOSIS — E039 Hypothyroidism, unspecified: Secondary | ICD-10-CM | POA: Diagnosis not present

## 2018-09-23 ENCOUNTER — Encounter: Payer: Self-pay | Admitting: Cardiovascular Disease

## 2018-09-23 ENCOUNTER — Ambulatory Visit: Payer: Medicare HMO | Admitting: Cardiovascular Disease

## 2018-09-23 VITALS — BP 143/62 | HR 49 | Ht 62.0 in | Wt 125.8 lb

## 2018-09-23 DIAGNOSIS — Z01812 Encounter for preprocedural laboratory examination: Secondary | ICD-10-CM

## 2018-09-23 DIAGNOSIS — I739 Peripheral vascular disease, unspecified: Secondary | ICD-10-CM

## 2018-09-23 DIAGNOSIS — R0602 Shortness of breath: Secondary | ICD-10-CM | POA: Diagnosis not present

## 2018-09-23 DIAGNOSIS — E785 Hyperlipidemia, unspecified: Secondary | ICD-10-CM | POA: Diagnosis not present

## 2018-09-23 DIAGNOSIS — I701 Atherosclerosis of renal artery: Secondary | ICD-10-CM | POA: Diagnosis not present

## 2018-09-23 DIAGNOSIS — I10 Essential (primary) hypertension: Secondary | ICD-10-CM | POA: Diagnosis not present

## 2018-09-23 DIAGNOSIS — I2510 Atherosclerotic heart disease of native coronary artery without angina pectoris: Secondary | ICD-10-CM

## 2018-09-23 NOTE — Patient Instructions (Signed)
Your physician recommends that you continue on your current medications as directed. Please refer to the Current Medication list given to you today.  Your physician recommends that you return for lab work in:  [place] [address] Dept: [phone] Loc: [place]  09/23/2018  You are scheduled for a Peripheral Angiogram on Wednesday, November 13 with Dr. Kathlyn Sacramento.  1. Please arrive at the [facility] (Main Entrance A) at [facility]: [address] [place], [place] [address] at 6:30 AM (This time is two hours before your procedure to ensure your preparation). Free valet parking service is available.   Special note: Every effort is made to have your procedure done on time. Please understand that emergencies sometimes delay scheduled procedures.  2. Diet: Do not eat solid foods after midnight.  The patient may have clear liquids until 5am upon the day of the procedure.  3. Labs: You will need to have blood drawn on Tuesday, October 22 at [place], [place]  Open: [place] (Lunch 12:30 - 1:30)   Phone: [phone]. You do not need to be fasting.  4. Medication instructions in preparation for your procedure:  *For reference purposes while preparing patient instructions.   Delete this med list prior to printing instructions for patient.*  On the morning of your procedure, take your Aspirin and any morning medicines NOT listed above.  You may use sips of water.  5. Plan for one night stay--bring personal belongings. 6. Bring a current list of your medications and current insurance cards. 7. You MUST have a responsible person to drive you home. 8. Someone MUST be with you the first 24 hours after you arrive home or your discharge will be delayed. 9. Please wear clothes that are easy to  get on and off and wear slip-on shoes.  Thank you for allowing [place] to care for you!   -- [facility]

## 2018-09-23 NOTE — H&P (View-Only) (Signed)
Cardiology Office Note   Date:  09/23/2018   ID:  Erica [name], DOB Jun 13, 1941, MRN [id number]  PCP:  Mayra Neer, MD  Cardiologist:  Dr. Burt Knack   Chief Complaint  Patient presents with  . Follow-up    pt denied chest pain      History of Present Illness: Erica [name] is a 77 y.o. female who is here today for follow-up visit regarding peripheral arterial disease.    She has known history of coronary artery disease status post CABG in 2011, mild to moderate carotid disease, hyperlipidemia and essential hypertension.  She quit smoking at the age of 52. She is followed for left calf claudication. She has been followed for left calf claudication. Vascular studies showed mildly reduced ABI on the left at 0.76 with no significant velocity elevation on duplex.  She underwent CTA angiogram which showed significant narrowing of the distal aorta with high-grade stenosis of the mid left common femoral artery and moderate stenosis affecting the left popliteal artery with three-vessel runoff below the knee.  On the right, there was moderate stenosis affecting the right common iliac artery and moderate stenosis affecting the popliteal artery.  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 The patient had worsening angina few months ago. Cardiac catheterization in July showed significant restenosis in the right coronary artery which was treated with cutting balloon angioplasty.  There was significant calcified LAD disease which was treated with staged atherectomy and drug-eluting stent placement in August. No chest pain or shortness of breath.   She reports worsening left calf claudication which is happening after walking less than 500 feet.  The pain continues even after she rests for 5 minutes.  The pain has been more intense than before and significantly affected her ability to be active and walk.   Diagnosis Date  . Anxiety   . Chronic mid  back pain   . Coronary artery disease    status post multiple percutaneous interventions  . Depression   . History of kidney stones 1990s X 1; 2016   . Hyperlipidemia   . Hypertension   . Hypothyroidism   . Myocardial infarction ([place]) 01/2010  . Osteoarthritis    "hands, back" (06/04/2018)    Past Surgical History:  Procedure Laterality Date  . CARDIAC CATHETERIZATION  12-2008  . CATARACT EXTRACTION W/ INTRAOCULAR LENS IMPLANT Bilateral   . CORONARY ANGIOPLASTY WITH STENT PLACEMENT  2006    right coronary artery with drug-eluting stent  . CORONARY ANGIOPLASTY WITH STENT PLACEMENT  1998    bare-metal stent to the right coronary artery  . CORONARY ANGIOPLASTY WITH STENT PLACEMENT  2008   drug-eluting stent placed in the left circumflex  . CORONARY ARTERY BYPASS GRAFT  01-25-10   CABG X4  . CORONARY ATHERECTOMY N/A 07/16/2018   Procedure: CORONARY ATHERECTOMY;  Surgeon: [name], MD;  Location: [place] [facility];  Service: Cardiovascular;  Laterality: N/A;  . CORONARY BALLOON ANGIOPLASTY  06/04/2018   cutting balloon  . CORONARY BALLOON ANGIOPLASTY N/A 06/04/2018   Procedure: CORONARY BALLOON ANGIOPLASTY;  Surgeon: [name], MD;  Location: [place] [facility];  Service: Cardiovascular;  Laterality: N/A;  . CYSTOSCOPY WITH RETROGRADE PYELOGRAM, URETEROSCOPY AND STENT PLACEMENT Right 10/18/2015   Procedure: CYSTOSCOPY WITH RETROGRADE PYELOGRAM,  AND STENT PLACEMENT;  Surgeon: Festus Aloe, MD;  Location: [facility];  Service: Urology;  Laterality: Right;  . CYSTOSCOPY/URETEROSCOPY/HOLMIUM LASER/STENT PLACEMENT Right 11/29/2015   Procedure: RIGHT URETEROSCOPY/RIGHT  RETROGRADE PYELOGRAM/ RIGHT STENT REPLACEMENT;  Surgeon: Festus Aloe, MD;  Location: [facility];  Service: Urology;  Laterality: Right;  . FOOT SURGERY Bilateral 2008-2016   "bones shortened"  . INTRAVASCULAR PRESSURE WIRE/FFR STUDY N/A 06/04/2018   Procedure: INTRAVASCULAR PRESSURE WIRE/FFR STUDY;   Surgeon: [name], MD;  Location: [place] [facility];  Service: Cardiovascular;  Laterality: N/A;  . LEFT HEART CATH AND CORS/GRAFTS ANGIOGRAPHY N/A 06/04/2018   Procedure: LEFT HEART CATH AND CORS/GRAFTS ANGIOGRAPHY;  Surgeon: [name], MD;  Location: [place] [facility];  Service: Cardiovascular;  Laterality: N/A;  . ULTRASOUND GUIDANCE FOR VASCULAR ACCESS  06/04/2018   Procedure: Ultrasound Guidance For Vascular Access;  Surgeon: [name], MD;  Location: [place] [facility];  Service: Cardiovascular;;     Current Outpatient Medications  Medication Sig Dispense Refill  . amLODipine (NORVASC) 2.5 MG tablet Take 1 tablet (2.5 mg total) by mouth daily. 90 tablet 3  . aspirin 81 MG tablet Take 1 tablet (81 mg total) by mouth daily. 30 tablet 0  . atenolol (TENORMIN) 25 MG tablet Take 0.5 tablets (12.5 mg total) by mouth daily. 30 tablet 2  . atorvastatin (LIPITOR) 80 MG tablet Take 80 mg by mouth daily.    . clopidogrel (PLAVIX) 75 MG tablet Take 1 tablet (75 mg total) by mouth daily with breakfast. 90 tablet 2  . colesevelam (WELCHOL) 625 MG tablet Take 1,250 mg by mouth 2 (two) times daily with a meal.    . hydrochlorothiazide (HYDRODIURIL) 25 MG tablet Take 25 mg by mouth daily.    Marland Kitchen levothyroxine (SYNTHROID, LEVOTHROID) 100 MCG tablet Take 100 mcg by mouth daily.    . Multiple Vitamins-Minerals (WOMENS MULTIVITAMIN PO) Take 1 tablet by mouth 3 (three) times a week.    . nitroGLYCERIN (NITROSTAT) 0.4 MG SL tablet Place 1 tablet (0.4 mg total) under the tongue every 5 (five) minutes as needed for chest pain. 25 tablet 3  . sertraline (ZOLOFT) 50 MG tablet Take 50 mg by mouth daily.      No current facility-administered medications for this visit.     Allergies:   Morphine and related    Social History:  The patient  reports that she quit smoking about 21 years ago. Her smoking use included cigarettes. She has a 37.00 pack-year smoking history. She has never used smokeless  tobacco. She reports that she drank alcohol. She reports that she does not use drugs.   Family History:  The patient's family history includes Other in her unknown relative; Other (age of onset: 12) in her mother.    ROS:  Please see the history of present illness.   Otherwise, review of systems are positive for none.   All other systems are reviewed and negative.    PHYSICAL EXAM: VS:  BP (!) 143/62   Pulse (!) 49   Ht 5\' 2"  (1.575 m)   Wt 125 lb 12.8 oz (57.1 kg)   BMI 23.01 kg/m  , BMI Body mass index is 23.01 kg/m. GEN: Well nourished, well developed, in no acute distress  HEENT: normal  Neck: no JVD, or masses.  Faint right carotid bruit Cardiac: RRR; no murmurs, rubs, or gallops,no edema  Respiratory:  clear to auscultation bilaterally, normal work of breathing GI: soft, nontender, nondistended, + BS MS: no deformity or atrophy  Skin: warm and dry, no rash Neuro:  Strength and sensation are intact Psych: euthymic mood, full affect Vascular: Femoral pulses +2 on the right  side and barely palpable on the left side.   EKG:  EKG is not ordered today.   Recent Labs: 07/11/2018: ALT 25 07/29/2018: BUN 15; Creatinine, Ser 0.88; Hemoglobin 13.8; Platelets 275; Potassium 3.9; Sodium 144    Lipid Panel    Component Value Date/Time   CHOL  01/09/2010 0506    131        ATP III CLASSIFICATION:  <200     mg/dL   Desirable  200-239  mg/dL   Borderline High  >=240    mg/dL   High          TRIG 87 01/09/2010 0506   HDL 48 01/09/2010 0506   CHOLHDL 2.7 01/09/2010 0506   VLDL 17 01/09/2010 0506   LDLCALC  01/09/2010 0506    66        Total Cholesterol/HDL:CHD Risk Coronary Heart Disease Risk Table                     Men   Women  1/2 Average Risk   3.4   3.3  Average Risk       5.0   4.4  2 X Average Risk   9.6   7.1  3 X Average Risk  23.4   11.0        Use the calculated Patient Ratio above and the CHD Risk Table to determine the patient's CHD Risk.        ATP III  CLASSIFICATION (LDL):  <100     mg/dL   Optimal  100-129  mg/dL   Near or Above                    Optimal  130-159  mg/dL   Borderline  160-189  mg/dL   High  >190     mg/dL   Very High      Wt Readings from Last 3 Encounters:  09/23/18 125 lb 12.8 oz (57.1 kg)  07/29/18 128 lb 6.4 oz (58.2 kg)  07/16/18 125 lb 10.6 oz (57 kg)      No flowsheet data found.    ASSESSMENT AND PLAN:  1.  Peripheral arterial disease: Worsening left calf claudication which is now severe and lifestyle limiting.  She likely has significant inflow disease on the left side and significant stenosis affecting the common femoral artery.  I doubt that her aortic stenosis is obstructive given that right femoral pulses normal.   Given progression of her symptoms, I recommend proceeding with abdominal aortogram, lower extremity runoff and possible endovascular intervention.  I discussed the procedure in details as well as risks and benefits. She is already on dual antiplatelet therapy.  2.  Coronary artery disease involving native coronary arteries with other forms of angina: No recurrent chest pain after recent PCI to the right coronary artery and LAD.  3.  Renal artery stenosis: Noted on CTA on the right side.  No indication for revascularization given that her blood pressure is controlled with no heart failure or progressive chronic kidney disease.  4.  Essential hypertension: Blood pressure is reasonably controlled on current medications.  5.   Hyperlipidemia: Continue high-dose atorvastatin with a target LDL of less than 70.   Disposition:   FU with me in 1 months  Signed,  Kathlyn Sacramento, MD  09/23/2018 10:26 AM    [place]

## 2018-09-23 NOTE — Progress Notes (Signed)
Cardiology Office Note   Date:  09/23/2018   ID:  Erica [name], DOB 1941-01-18, MRN [id number]  PCP:  Erica Neer, MD  Cardiologist:  Dr. Burt Knack   Chief Complaint  Patient presents with  . Follow-up    pt denied chest pain      History of Present Illness: Erica [name] is a 77 y.o. female who is here today for follow-up visit regarding peripheral arterial disease.    She has known history of coronary artery disease status post CABG in 2011, mild to moderate carotid disease, hyperlipidemia and essential hypertension.  She quit smoking at the age of 17. She is followed for left calf claudication. She has been followed for left calf claudication. Vascular studies showed mildly reduced ABI on the left at 0.76 with no significant velocity elevation on duplex.  She underwent CTA angiogram which showed significant narrowing of the distal aorta with high-grade stenosis of the mid left common femoral artery and moderate stenosis affecting the left popliteal artery with three-vessel runoff below the knee.  On the right, there was moderate stenosis affecting the right common iliac artery and moderate stenosis affecting the popliteal artery.  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 The patient had worsening angina few months ago. Cardiac catheterization in July showed significant restenosis in the right coronary artery which was treated with cutting balloon angioplasty.  There was significant calcified LAD disease which was treated with staged atherectomy and drug-eluting stent placement in August. No chest pain or shortness of breath.   She reports worsening left calf claudication which is happening after walking less than 500 feet.  The pain continues even after she rests for 5 minutes.  The pain has been more intense than before and significantly affected her ability to be active and walk.   Diagnosis Date  . Anxiety   . Chronic mid  back pain   . Coronary artery disease    status post multiple percutaneous interventions  . Depression   . History of kidney stones 1990s X 1; 2016   . Hyperlipidemia   . Hypertension   . Hypothyroidism   . Myocardial infarction ([place]) 01/2010  . Osteoarthritis    "hands, back" (06/04/2018)    Past Surgical History:  Procedure Laterality Date  . CARDIAC CATHETERIZATION  12-2008  . CATARACT EXTRACTION W/ INTRAOCULAR LENS IMPLANT Bilateral   . CORONARY ANGIOPLASTY WITH STENT PLACEMENT  2006    right coronary artery with drug-eluting stent  . CORONARY ANGIOPLASTY WITH STENT PLACEMENT  1998    bare-metal stent to the right coronary artery  . CORONARY ANGIOPLASTY WITH STENT PLACEMENT  2008   drug-eluting stent placed in the left circumflex  . CORONARY ARTERY BYPASS GRAFT  01-25-10   CABG X4  . CORONARY ATHERECTOMY N/A 07/16/2018   Procedure: CORONARY ATHERECTOMY;  Surgeon: [name], MD;  Location: [place] [facility];  Service: Cardiovascular;  Laterality: N/A;  . CORONARY BALLOON ANGIOPLASTY  06/04/2018   cutting balloon  . CORONARY BALLOON ANGIOPLASTY N/A 06/04/2018   Procedure: CORONARY BALLOON ANGIOPLASTY;  Surgeon: [name], MD;  Location: [place] [facility];  Service: Cardiovascular;  Laterality: N/A;  . CYSTOSCOPY WITH RETROGRADE PYELOGRAM, URETEROSCOPY AND STENT PLACEMENT Right 10/18/2015   Procedure: CYSTOSCOPY WITH RETROGRADE PYELOGRAM,  AND STENT PLACEMENT;  Surgeon: Festus Aloe, MD;  Location: [facility];  Service: Urology;  Laterality: Right;  . CYSTOSCOPY/URETEROSCOPY/HOLMIUM LASER/STENT PLACEMENT Right 11/29/2015   Procedure: RIGHT URETEROSCOPY/RIGHT  RETROGRADE PYELOGRAM/ RIGHT STENT REPLACEMENT;  Surgeon: Festus Aloe, MD;  Location: [facility];  Service: Urology;  Laterality: Right;  . FOOT SURGERY Bilateral 2008-2016   "bones shortened"  . INTRAVASCULAR PRESSURE WIRE/FFR STUDY N/A 06/04/2018   Procedure: INTRAVASCULAR PRESSURE WIRE/FFR STUDY;   Surgeon: [name], MD;  Location: [place] [facility];  Service: Cardiovascular;  Laterality: N/A;  . LEFT HEART CATH AND CORS/GRAFTS ANGIOGRAPHY N/A 06/04/2018   Procedure: LEFT HEART CATH AND CORS/GRAFTS ANGIOGRAPHY;  Surgeon: [name], MD;  Location: [place] [facility];  Service: Cardiovascular;  Laterality: N/A;  . ULTRASOUND GUIDANCE FOR VASCULAR ACCESS  06/04/2018   Procedure: Ultrasound Guidance For Vascular Access;  Surgeon: [name], MD;  Location: [place] [facility];  Service: Cardiovascular;;     Current Outpatient Medications  Medication Sig Dispense Refill  . amLODipine (NORVASC) 2.5 MG tablet Take 1 tablet (2.5 mg total) by mouth daily. 90 tablet 3  . aspirin 81 MG tablet Take 1 tablet (81 mg total) by mouth daily. 30 tablet 0  . atenolol (TENORMIN) 25 MG tablet Take 0.5 tablets (12.5 mg total) by mouth daily. 30 tablet 2  . atorvastatin (LIPITOR) 80 MG tablet Take 80 mg by mouth daily.    . clopidogrel (PLAVIX) 75 MG tablet Take 1 tablet (75 mg total) by mouth daily with breakfast. 90 tablet 2  . colesevelam (WELCHOL) 625 MG tablet Take 1,250 mg by mouth 2 (two) times daily with a meal.    . hydrochlorothiazide (HYDRODIURIL) 25 MG tablet Take 25 mg by mouth daily.    Marland Kitchen levothyroxine (SYNTHROID, LEVOTHROID) 100 MCG tablet Take 100 mcg by mouth daily.    . Multiple Vitamins-Minerals (WOMENS MULTIVITAMIN PO) Take 1 tablet by mouth 3 (three) times a week.    . nitroGLYCERIN (NITROSTAT) 0.4 MG SL tablet Place 1 tablet (0.4 mg total) under the tongue every 5 (five) minutes as needed for chest pain. 25 tablet 3  . sertraline (ZOLOFT) 50 MG tablet Take 50 mg by mouth daily.      No current facility-administered medications for this visit.     Allergies:   Morphine and related    Social History:  The patient  reports that she quit smoking about 21 years ago. Her smoking use included cigarettes. She has a 37.00 pack-year smoking history. She has never used smokeless  tobacco. She reports that she drank alcohol. She reports that she does not use drugs.   Family History:  The patient's family history includes Other in her unknown relative; Other (age of onset: 103) in her mother.    ROS:  Please see the history of present illness.   Otherwise, review of systems are positive for none.   All other systems are reviewed and negative.    PHYSICAL EXAM: VS:  BP (!) 143/62   Pulse (!) 49   Ht 5\' 2"  (1.575 m)   Wt 125 lb 12.8 oz (57.1 kg)   BMI 23.01 kg/m  , BMI Body mass index is 23.01 kg/m. GEN: Well nourished, well developed, in no acute distress  HEENT: normal  Neck: no JVD, or masses.  Faint right carotid bruit Cardiac: RRR; no murmurs, rubs, or gallops,no edema  Respiratory:  clear to auscultation bilaterally, normal work of breathing GI: soft, nontender, nondistended, + BS MS: no deformity or atrophy  Skin: warm and dry, no rash Neuro:  Strength and sensation are intact Psych: euthymic mood, full affect Vascular: Femoral pulses +2 on the right  side and barely palpable on the left side.   EKG:  EKG is not ordered today.   Recent Labs: 07/11/2018: ALT 25 07/29/2018: BUN 15; Creatinine, Ser 0.88; Hemoglobin 13.8; Platelets 275; Potassium 3.9; Sodium 144    Lipid Panel    Component Value Date/Time   CHOL  01/09/2010 0506    131        ATP III CLASSIFICATION:  <200     mg/dL   Desirable  200-239  mg/dL   Borderline High  >=240    mg/dL   High          TRIG 87 01/09/2010 0506   HDL 48 01/09/2010 0506   CHOLHDL 2.7 01/09/2010 0506   VLDL 17 01/09/2010 0506   LDLCALC  01/09/2010 0506    66        Total Cholesterol/HDL:CHD Risk Coronary Heart Disease Risk Table                     Men   Women  1/2 Average Risk   3.4   3.3  Average Risk       5.0   4.4  2 X Average Risk   9.6   7.1  3 X Average Risk  23.4   11.0        Use the calculated Patient Ratio above and the CHD Risk Table to determine the patient's CHD Risk.        ATP III  CLASSIFICATION (LDL):  <100     mg/dL   Optimal  100-129  mg/dL   Near or Above                    Optimal  130-159  mg/dL   Borderline  160-189  mg/dL   High  >190     mg/dL   Very High      Wt Readings from Last 3 Encounters:  09/23/18 125 lb 12.8 oz (57.1 kg)  07/29/18 128 lb 6.4 oz (58.2 kg)  07/16/18 125 lb 10.6 oz (57 kg)      No flowsheet data found.    ASSESSMENT AND PLAN:  1.  Peripheral arterial disease: Worsening left calf claudication which is now severe and lifestyle limiting.  She likely has significant inflow disease on the left side and significant stenosis affecting the common femoral artery.  I doubt that her aortic stenosis is obstructive given that right femoral pulses normal.   Given progression of her symptoms, I recommend proceeding with abdominal aortogram, lower extremity runoff and possible endovascular intervention.  I discussed the procedure in details as well as risks and benefits. She is already on dual antiplatelet therapy.  2.  Coronary artery disease involving native coronary arteries with other forms of angina: No recurrent chest pain after recent PCI to the right coronary artery and LAD.  3.  Renal artery stenosis: Noted on CTA on the right side.  No indication for revascularization given that her blood pressure is controlled with no heart failure or progressive chronic kidney disease.  4.  Essential hypertension: Blood pressure is reasonably controlled on current medications.  5.   Hyperlipidemia: Continue high-dose atorvastatin with a target LDL of less than 70.   Disposition:   FU with me in 1 months  Signed,  Kathlyn Sacramento, MD  09/23/2018 10:26 AM    [place]

## 2018-09-24 LAB — CBC
Hematocrit: 40.7 % (ref 34.0–46.6)
Hemoglobin: 13.8 g/dL (ref 11.1–15.9)
MCH: 30.3 pg (ref 26.6–33.0)
MCHC: 33.9 g/dL (ref 31.5–35.7)
MCV: 89 fL (ref 79–97)
PLATELETS: 261 10*3/uL (ref 150–450)
RBC: 4.56 x10E6/uL (ref 3.77–5.28)
RDW: 12.5 % (ref 12.3–15.4)
WBC: 6.3 10*3/uL (ref 3.4–10.8)

## 2018-09-25 ENCOUNTER — Telehealth: Payer: Self-pay | Admitting: *Deleted

## 2018-09-25 NOTE — Telephone Encounter (Signed)
-----   Message from Wellington Hampshire, MD sent at 09/24/2018  5:57 PM EDT ----- Normal CBC.  Did she get basic metabolic profile done?

## 2018-09-25 NOTE — Telephone Encounter (Signed)
Patient made aware of results and verbalized understanding.  Call placed to [facility] to check on the BMET that was supposed to be drawn with the CBC. The technician stated that they did not have access to the BMET because the order was placed differently then the CBC, therefore it was not drawn.  The patient stated that she had labs drawn recently at her PCP. Call placed to their office to have them faxed over.

## 2018-09-29 NOTE — Telephone Encounter (Signed)
Second call placed to PCP for lab work.

## 2018-09-29 NOTE — Telephone Encounter (Signed)
Labs printed from [facility] and put in provider's box for his review.

## 2018-10-08 DIAGNOSIS — L570 Actinic keratosis: Secondary | ICD-10-CM | POA: Diagnosis not present

## 2018-10-08 DIAGNOSIS — L720 Epidermal cyst: Secondary | ICD-10-CM | POA: Diagnosis not present

## 2018-10-13 ENCOUNTER — Telehealth: Payer: Self-pay | Admitting: *Deleted

## 2018-10-13 NOTE — Telephone Encounter (Signed)
Pt contacted pre-catheterization scheduled at [facility] for: Wednesday October 15, 2018 8:30 AM Verified arrival time and place: [facility] at: 6:30 AM  No solid food after midnight prior to cath, clear liquids until 5 AM day of procedure. Contrast allergy: no Verified no diabetes medications.  Hold: HCTZ-AM of procedure.  Except hold medications AM meds can be  taken pre-cath with sip of water including: ASA 81 mg Clopidogrel 75 mg  Confirmed patient has responsible person to drive home post procedure and for 24 hours after you arrive home: yes

## 2018-10-15 ENCOUNTER — Other Ambulatory Visit: Payer: Self-pay

## 2018-10-15 ENCOUNTER — Telehealth: Payer: Self-pay | Admitting: *Deleted

## 2018-10-15 ENCOUNTER — Encounter (HOSPITAL_COMMUNITY): Payer: Self-pay | Admitting: General Practice

## 2018-10-15 ENCOUNTER — Encounter (HOSPITAL_COMMUNITY)
Admission: RE | Disposition: A | Discharge status: Home / Self Care | Payer: Self-pay | Source: Ambulatory Visit | Attending: Cardiovascular Disease

## 2018-10-15 ENCOUNTER — Ambulatory Visit (HOSPITAL_COMMUNITY)
Admission: RE | Admit: 2018-10-15 | Discharge: 2018-10-16 | Disposition: A | Discharge status: Home / Self Care | Payer: Medicare HMO | Source: Ambulatory Visit | Attending: Cardiovascular Disease | Admitting: Cardiovascular Disease

## 2018-10-15 DIAGNOSIS — Z7982 Long term (current) use of aspirin: Secondary | ICD-10-CM | POA: Insufficient documentation

## 2018-10-15 DIAGNOSIS — E785 Hyperlipidemia, unspecified: Secondary | ICD-10-CM | POA: Insufficient documentation

## 2018-10-15 DIAGNOSIS — I701 Atherosclerosis of renal artery: Secondary | ICD-10-CM | POA: Diagnosis not present

## 2018-10-15 DIAGNOSIS — I252 Old myocardial infarction: Secondary | ICD-10-CM | POA: Diagnosis not present

## 2018-10-15 DIAGNOSIS — I350 Nonrheumatic aortic (valve) stenosis: Secondary | ICD-10-CM | POA: Diagnosis not present

## 2018-10-15 DIAGNOSIS — Z9841 Cataract extraction status, right eye: Secondary | ICD-10-CM | POA: Diagnosis not present

## 2018-10-15 DIAGNOSIS — Z960 Presence of urogenital implants: Secondary | ICD-10-CM | POA: Diagnosis not present

## 2018-10-15 DIAGNOSIS — Z9889 Other specified postprocedural states: Secondary | ICD-10-CM | POA: Insufficient documentation

## 2018-10-15 DIAGNOSIS — I10 Essential (primary) hypertension: Secondary | ICD-10-CM | POA: Diagnosis present

## 2018-10-15 DIAGNOSIS — Z885 Allergy status to narcotic agent status: Secondary | ICD-10-CM | POA: Insufficient documentation

## 2018-10-15 DIAGNOSIS — M1990 Unspecified osteoarthritis, unspecified site: Secondary | ICD-10-CM | POA: Insufficient documentation

## 2018-10-15 DIAGNOSIS — I70213 Atherosclerosis of native arteries of extremities with intermittent claudication, bilateral legs: Secondary | ICD-10-CM | POA: Insufficient documentation

## 2018-10-15 DIAGNOSIS — Z79899 Other long term (current) drug therapy: Secondary | ICD-10-CM | POA: Insufficient documentation

## 2018-10-15 DIAGNOSIS — Z9842 Cataract extraction status, left eye: Secondary | ICD-10-CM | POA: Insufficient documentation

## 2018-10-15 DIAGNOSIS — Z87442 Personal history of urinary calculi: Secondary | ICD-10-CM | POA: Diagnosis not present

## 2018-10-15 DIAGNOSIS — I700 Atherosclerosis of aorta: Secondary | ICD-10-CM | POA: Diagnosis not present

## 2018-10-15 DIAGNOSIS — Z79890 Hormone replacement therapy (postmenopausal): Secondary | ICD-10-CM | POA: Diagnosis not present

## 2018-10-15 DIAGNOSIS — I739 Peripheral vascular disease, unspecified: Secondary | ICD-10-CM

## 2018-10-15 DIAGNOSIS — Z87891 Personal history of nicotine dependence: Secondary | ICD-10-CM | POA: Insufficient documentation

## 2018-10-15 DIAGNOSIS — I771 Stricture of artery: Secondary | ICD-10-CM | POA: Insufficient documentation

## 2018-10-15 DIAGNOSIS — E783 Hyperchylomicronemia: Secondary | ICD-10-CM | POA: Diagnosis present

## 2018-10-15 DIAGNOSIS — Z955 Presence of coronary angioplasty implant and graft: Secondary | ICD-10-CM | POA: Diagnosis not present

## 2018-10-15 DIAGNOSIS — I2510 Atherosclerotic heart disease of native coronary artery without angina pectoris: Secondary | ICD-10-CM | POA: Diagnosis present

## 2018-10-15 DIAGNOSIS — Z951 Presence of aortocoronary bypass graft: Secondary | ICD-10-CM | POA: Diagnosis not present

## 2018-10-15 DIAGNOSIS — I70212 Atherosclerosis of native arteries of extremities with intermittent claudication, left leg: Secondary | ICD-10-CM | POA: Diagnosis not present

## 2018-10-15 DIAGNOSIS — E039 Hypothyroidism, unspecified: Secondary | ICD-10-CM | POA: Diagnosis not present

## 2018-10-15 HISTORY — PX: PERIPHERAL VASCULAR INTERVENTION: CATH118257

## 2018-10-15 HISTORY — PX: ABDOMINAL AORTOGRAM W/LOWER EXTREMITY: CATH118223

## 2018-10-15 LAB — POCT ACTIVATED CLOTTING TIME: Activated Clotting Time: 213 seconds

## 2018-10-15 LAB — BASIC METABOLIC PANEL
Anion gap: 6 (ref 5–15)
BUN: 23 mg/dL (ref 8–23)
CALCIUM: 9.1 mg/dL (ref 8.9–10.3)
CHLORIDE: 107 mmol/L (ref 98–111)
CO2: 27 mmol/L (ref 22–32)
Creatinine, Ser: 0.91 mg/dL (ref 0.44–1.00)
GFR calc Af Amer: >60 mL/min (ref >60)
GFR, EST NON AFRICAN AMERICAN: 59 mL/min — AB (ref >60)
GLUCOSE: 114 mg/dL — AB (ref 70–99)
POTASSIUM: 3.6 mmol/L (ref 3.5–5.1)
Sodium: 140 mmol/L (ref 135–145)

## 2018-10-15 SURGERY — ABDOMINAL AORTOGRAM W/LOWER EXTREMITY
Anesthesia: LOCAL

## 2018-10-15 MED ORDER — SERTRALINE HCL 50 MG PO TABS
50.0000 mg | ORAL_TABLET | Freq: Every day | ORAL | Status: DC
  Administered 2018-10-16: 50 mg via ORAL
  Filled 2018-10-15: qty 1

## 2018-10-15 MED ORDER — SODIUM CHLORIDE 0.9 % IV SOLN
INTRAVENOUS | Status: DC
  Administered 2018-10-15: 07:00:00 via INTRAVENOUS

## 2018-10-15 MED ORDER — ANGIOPLASTY BOOK
Freq: Once | Status: AC
  Administered 2018-10-15: 22:00:00
  Filled 2018-10-15: qty 1

## 2018-10-15 MED ORDER — HEPARIN (PORCINE) IN NACL 1000-0.9 UT/500ML-% IV SOLN
INTRAVENOUS | Status: AC
  Filled 2018-10-15: qty 500

## 2018-10-15 MED ORDER — MIDAZOLAM HCL 2 MG/2ML IJ SOLN
INTRAMUSCULAR | Status: AC
  Filled 2018-10-15: qty 2

## 2018-10-15 MED ORDER — NITROGLYCERIN 0.4 MG SL SUBL: 0.4000 mg | SUBLINGUAL_TABLET | SUBLINGUAL | Status: DC | PRN

## 2018-10-15 MED ORDER — COLESEVELAM HCL 625 MG PO TABS
1875.0000 mg | ORAL_TABLET | Freq: Two times a day (BID) | ORAL | Status: DC
  Administered 2018-10-15 – 2018-10-16 (×2): 1875 mg via ORAL
  Filled 2018-10-15 (×3): qty 3

## 2018-10-15 MED ORDER — SODIUM CHLORIDE 0.9 % IV SOLN: 250.0000 mL | INTRAVENOUS | Status: DC | PRN

## 2018-10-15 MED ORDER — NITROGLYCERIN 1 MG/10 ML FOR IR/CATH LAB
INTRA_ARTERIAL | Status: DC | PRN
  Administered 2018-10-15 (×2): 200 ug via INTRA_ARTERIAL

## 2018-10-15 MED ORDER — ATENOLOL 25 MG PO TABS
12.5000 mg | ORAL_TABLET | Freq: Every day | ORAL | Status: DC
  Administered 2018-10-16: 10:00:00 12.5 mg via ORAL
  Filled 2018-10-15: qty 0.5

## 2018-10-15 MED ORDER — LEVOTHYROXINE SODIUM 100 MCG PO TABS
100.0000 ug | ORAL_TABLET | Freq: Every day | ORAL | Status: DC
  Administered 2018-10-16: 06:00:00 100 ug via ORAL
  Filled 2018-10-15: qty 1

## 2018-10-15 MED ORDER — ALUM & MAG HYDROXIDE-SIMETH 200-200-20 MG/5ML PO SUSP
30.0000 mL | ORAL | Status: DC | PRN
  Administered 2018-10-15: 20:00:00 30 mL via ORAL
  Filled 2018-10-15: qty 30

## 2018-10-15 MED ORDER — SODIUM CHLORIDE 0.9 % WEIGHT BASED INFUSION: 1.0000 mL/kg/h | INTRAVENOUS | Status: AC

## 2018-10-15 MED ORDER — HEPARIN SODIUM (PORCINE) 1000 UNIT/ML IJ SOLN
INTRAMUSCULAR | Status: DC | PRN
  Administered 2018-10-15: 4000 [IU] via INTRAVENOUS
  Administered 2018-10-15: 1000 [IU] via INTRAVENOUS

## 2018-10-15 MED ORDER — LIDOCAINE HCL (PF) 1 % IJ SOLN
INTRAMUSCULAR | Status: AC
  Filled 2018-10-15: qty 30

## 2018-10-15 MED ORDER — SODIUM CHLORIDE 0.9% FLUSH: 3.0000 mL | Freq: Two times a day (BID) | INTRAVENOUS | Status: DC

## 2018-10-15 MED ORDER — SODIUM CHLORIDE 0.9% FLUSH: 3.0000 mL | INTRAVENOUS | Status: DC | PRN

## 2018-10-15 MED ORDER — ASPIRIN 81 MG PO CHEW: 81.0000 mg | CHEWABLE_TABLET | ORAL | Status: DC

## 2018-10-15 MED ORDER — IODIXANOL 320 MG/ML IV SOLN
INTRAVENOUS | Status: DC | PRN
  Administered 2018-10-15: 190 mL via INTRA_ARTERIAL

## 2018-10-15 MED ORDER — NITROGLYCERIN 1 MG/10 ML FOR IR/CATH LAB
INTRA_ARTERIAL | Status: AC
  Filled 2018-10-15: qty 10

## 2018-10-15 MED ORDER — SODIUM CHLORIDE 0.9% FLUSH
3.0000 mL | Freq: Two times a day (BID) | INTRAVENOUS | Status: DC
  Administered 2018-10-15: 3 mL via INTRAVENOUS

## 2018-10-15 MED ORDER — ATORVASTATIN CALCIUM 80 MG PO TABS
80.0000 mg | ORAL_TABLET | Freq: Every day | ORAL | Status: DC
  Administered 2018-10-16: 10:00:00 80 mg via ORAL
  Filled 2018-10-15: qty 1

## 2018-10-15 MED ORDER — HYDROCHLOROTHIAZIDE 25 MG PO TABS
25.0000 mg | ORAL_TABLET | Freq: Every day | ORAL | Status: DC
  Administered 2018-10-16: 25 mg via ORAL
  Filled 2018-10-15: qty 1

## 2018-10-15 MED ORDER — HEPARIN (PORCINE) IN NACL 1000-0.9 UT/500ML-% IV SOLN
INTRAVENOUS | Status: DC | PRN
  Administered 2018-10-15 (×2): 500 mL

## 2018-10-15 MED ORDER — LIDOCAINE HCL (PF) 1 % IJ SOLN
INTRAMUSCULAR | Status: DC | PRN
  Administered 2018-10-15 (×2): 15 mL via INTRADERMAL

## 2018-10-15 MED ORDER — CLOPIDOGREL BISULFATE 75 MG PO TABS
75.0000 mg | ORAL_TABLET | Freq: Every day | ORAL | Status: DC
  Administered 2018-10-16: 75 mg via ORAL
  Filled 2018-10-15: qty 1

## 2018-10-15 MED ORDER — AMLODIPINE BESYLATE 5 MG PO TABS
2.5000 mg | ORAL_TABLET | Freq: Every day | ORAL | Status: DC
  Administered 2018-10-15: 17:00:00 2.5 mg via ORAL
  Filled 2018-10-15: qty 1

## 2018-10-15 MED ORDER — MIDAZOLAM HCL 2 MG/2ML IJ SOLN
INTRAMUSCULAR | Status: DC | PRN
  Administered 2018-10-15 (×3): 1 mg via INTRAVENOUS

## 2018-10-15 MED ORDER — ONDANSETRON HCL 4 MG/2ML IJ SOLN: 4.0000 mg | Freq: Four times a day (QID) | INTRAMUSCULAR | Status: DC | PRN

## 2018-10-15 MED ORDER — ACETAMINOPHEN 325 MG PO TABS
650.0000 mg | ORAL_TABLET | ORAL | Status: DC | PRN
  Filled 2018-10-15: qty 2

## 2018-10-15 MED ORDER — ASPIRIN EC 81 MG PO TBEC
81.0000 mg | DELAYED_RELEASE_TABLET | Freq: Every day | ORAL | Status: DC
  Administered 2018-10-16: 10:00:00 81 mg via ORAL
  Filled 2018-10-15: qty 1

## 2018-10-15 MED ORDER — HYDRALAZINE HCL 20 MG/ML IJ SOLN: 5.0000 mg | INTRAMUSCULAR | Status: DC | PRN

## 2018-10-15 SURGICAL SUPPLY — 25 items
BALLN MUSTANG 6.0X40 75 (BALLOONS) ×3
BALLOON MUSTANG 6.0X40 75 (BALLOONS) IMPLANT
CATH ANGIO 5F PIGTAIL 65CM (CATHETERS) ×1 IMPLANT
CATH CROSS OVER TEMPO 5F (CATHETERS) ×1 IMPLANT
CATH SOFT-VU 4F 65 STRAIGHT (CATHETERS) IMPLANT
CATH SOFT-VU STRAIGHT 4F 65CM (CATHETERS) ×3
CLOSURE MYNX CONTROL 6F/7F (Vascular Products) ×2 IMPLANT
KIT ENCORE 26 ADVANTAGE (KITS) ×2 IMPLANT
KIT MICROPUNCTURE NIT STIFF (SHEATH) ×1 IMPLANT
KIT PV (KITS) ×3 IMPLANT
SHEATH BRITE TIP 7FR 35CM (SHEATH) ×2 IMPLANT
SHEATH PINNACLE 5F 10CM (SHEATH) ×1 IMPLANT
SHEATH PINNACLE 7F 10CM (SHEATH) ×2 IMPLANT
SHEATH PROBE COVER 6X72 (BAG) ×1 IMPLANT
SHIELD RADPAD SCOOP 12X17 (MISCELLANEOUS) ×1 IMPLANT
STENT EXPRESS LD 8X37X75 (Permanent Stent) ×1 IMPLANT
STENT EXPRESS LD 8X57X75 (Permanent Stent) ×1 IMPLANT
STOPCOCK MORSE 400PSI 3WAY (MISCELLANEOUS) ×1 IMPLANT
SYR MEDRAD MARK 7 150ML (SYRINGE) ×1 IMPLANT
TAPE VIPERTRACK RADIOPAQ (MISCELLANEOUS) IMPLANT
TAPE VIPERTRACK RADIOPAQUE (MISCELLANEOUS) ×6
TRANSDUCER W/STOPCOCK (MISCELLANEOUS) ×3 IMPLANT
TRAY PV CATH (CUSTOM PROCEDURE TRAY) ×3 IMPLANT
TUBING CIL FLEX 10 FLL-RA (TUBING) ×1 IMPLANT
WIRE HITORQ VERSACORE ST 145CM (WIRE) ×2 IMPLANT

## 2018-10-15 NOTE — Telephone Encounter (Signed)
Post procedure Aorta/Iliac and ABI ordered. Message sent to scheduling for follow up with Dr. Fletcher Anon and duplexes.

## 2018-10-15 NOTE — Interval H&P Note (Signed)
History and Physical Interval Note:  10/15/2018 8:33 AM  Erica [name]  has presented today for surgery, with the diagnosis of pad  The various methods of treatment have been discussed with the patient and family. After consideration of risks, benefits and other options for treatment, the patient has consented to  Procedure(s): ABDOMINAL AORTOGRAM W/LOWER EXTREMITY (N/A) as a surgical intervention .  The patient's history has been reviewed, patient examined, no change in status, stable for surgery.  I have reviewed the patient's chart and labs.  Questions were answered to the patient's satisfaction.     Kathlyn Sacramento

## 2018-10-16 DIAGNOSIS — E039 Hypothyroidism, unspecified: Secondary | ICD-10-CM | POA: Diagnosis not present

## 2018-10-16 DIAGNOSIS — I2510 Atherosclerotic heart disease of native coronary artery without angina pectoris: Secondary | ICD-10-CM | POA: Diagnosis not present

## 2018-10-16 DIAGNOSIS — I739 Peripheral vascular disease, unspecified: Secondary | ICD-10-CM

## 2018-10-16 DIAGNOSIS — E785 Hyperlipidemia, unspecified: Secondary | ICD-10-CM | POA: Diagnosis not present

## 2018-10-16 DIAGNOSIS — I70213 Atherosclerosis of native arteries of extremities with intermittent claudication, bilateral legs: Secondary | ICD-10-CM | POA: Diagnosis not present

## 2018-10-16 DIAGNOSIS — I10 Essential (primary) hypertension: Secondary | ICD-10-CM | POA: Diagnosis not present

## 2018-10-16 DIAGNOSIS — I350 Nonrheumatic aortic (valve) stenosis: Secondary | ICD-10-CM | POA: Diagnosis not present

## 2018-10-16 DIAGNOSIS — I771 Stricture of artery: Secondary | ICD-10-CM | POA: Diagnosis not present

## 2018-10-16 DIAGNOSIS — I701 Atherosclerosis of renal artery: Secondary | ICD-10-CM | POA: Diagnosis not present

## 2018-10-16 DIAGNOSIS — Z87891 Personal history of nicotine dependence: Secondary | ICD-10-CM | POA: Diagnosis not present

## 2018-10-16 LAB — BASIC METABOLIC PANEL
ANION GAP: 7 (ref 5–15)
BUN: 13 mg/dL (ref 8–23)
CALCIUM: 8.9 mg/dL (ref 8.9–10.3)
CHLORIDE: 105 mmol/L (ref 98–111)
CO2: 26 mmol/L (ref 22–32)
Creatinine, Ser: 0.83 mg/dL (ref 0.44–1.00)
GFR calc Af Amer: >60 mL/min (ref >60)
GFR calc non Af Amer: >60 mL/min (ref >60)
GLUCOSE: 108 mg/dL — AB (ref 70–99)
Potassium: 3.7 mmol/L (ref 3.5–5.1)
Sodium: 138 mmol/L (ref 135–145)

## 2018-10-16 NOTE — Discharge Summary (Signed)
Discharge Summary    Patient ID: Erica [name],  MRN: [id number], DOB/AGE: 03-26-1941 77 y.o.  Admit date: 10/15/2018 Discharge date: 10/16/2018  Primary Care Provider: Mayra Neer Primary Cardiologist: [name], MD  Discharge Diagnoses    Principal Problem:   PAD (peripheral artery disease) ([place]) Active Problems:   HYPERLIPIDEMIA TYPE I / IV   Essential hypertension   CAD, NATIVE VESSEL   Allergies Allergies  Allergen Reactions  . Morphine And Related Nausea And Vomiting    Diagnostic Studies/Procedures    Abdominal aortogram with LE 10/16/18: 1.  Moderate distal aortic stenosis above the iliac bifurcation not significant by gradient. 2.  Right lower extremity: Borderline significant right common iliac artery stenosis with mild common femoral artery disease and no significant SFA popliteal disease and three-vessel runoff below the knee. 3.  Left lower extremity: Significant left common iliac artery stenosis with mild common femoral artery disease, no significant SFA and popliteal disease with three-vessel runoff below the knee. 4.  Successful bilateral common iliac artery kissing stent placement extending into the distal aorta.  Recommendations: The patient is already on dual antiplatelet therapy. Continue aggressive treatment of risk factors. Given bilateral groin access and small hematoma on the left side, will observe the patient overnight. _____________   History of Present Illness     77 y.o. female who presented outpatient follow-up visit with Dr. Fletcher Anon 09/23/18 regarding peripheral arterial disease. She has known history of coronary artery disease status post CABG in 2011, mild to moderate carotid disease, hyperlipidemia and essential hypertension.  She quit smoking at the age of 56.  She has been followed for left calf claudication. Vascular studies showed mildly reduced ABI on the left at 0.76 with no significant velocity elevation on duplex.   She underwent CTA angiogram which showed significant narrowing of the distal aorta with high-grade stenosis of the mid left common femoral artery and moderate stenosis affecting the left popliteal artery with three-vessel runoff below the knee.  On the right, there was moderate stenosis affecting the right common iliac artery and moderate stenosis affecting the popliteal artery.  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 The patient had worsening angina few months ago. Cardiac catheterization in July showed significant restenosis in the right coronary artery which was treated with cutting balloon angioplasty.  There was significant calcified LAD disease which was treated with staged atherectomy and drug-eluting stent placement in August.  No chest pain or shortness of breath.   She reports worsening left calf claudication which is happening after walking less than 500 feet.  The pain continues even after she rests for 5 minutes.  The pain has been more intense than before and significantly affected her ability to be active and walk.   Hospital Course     Consultants: None   1. Peripheral artery disease: patient followed outpatient with Dr. Fletcher Anon for left calf claudications. She presented 10/15/18 for abdominal aortogram which showed moderate distal aortic stenosis above the iliac bifurcation which was not significant by gradient; borderline significant right common iliac artery stenosis and significant left common iliac artery stenosis s/p successful bilateral common iliac artery kissing stent placement extending into the distal aorta. She was recommended for DAPT with aspirin and plavix. She was observed overnight given small left sided groin hematoma, resolved on the day of discharge. Bilateral groin sites with tenderness and ecchymosis but no hematoma or bruits appreciated. Cr stable post-procedure.  - Continue aspirin, plavix,  and atorvastatin -  Patient scheduled for follow-up doppler studies and ABI  2. CAD: no anginal complaints since recent PCI to RCA and LAD - Continue aspirin, plavix, and atorvastatin  3. HTN: BP stable post procedure - Continue amlodipine, atenolol, and HCTZ per home regimen  4. HLD: target LDL <70 - Continue atorvastatin  _____________  Discharge Vitals Blood pressure (!) 110/43, pulse (!) 58, temperature 98.1 F (36.7 C), temperature source Oral, resp. rate 13, height 5\' 2"  (1.575 m), weight 57 kg, SpO2 93 %.  Filed Weights   10/15/18 1115 10/16/18 0628  Weight: 57.1 kg 57 kg    GEN: No acute distress.   Neck: No JVD, no carotid bruits Cardiac:  RRR, no murmurs, rubs, or gallops.  Vasc: b/l groin site C/D/I with TTP and ecchymosis but no hematoma or bruits appreciated.  Respiratory: Clear to auscultation bilaterally, no wheezes/ rales/ rhonchi GI: NABS, Soft, nontender, non-distended  MS: No edema; No deformity. Neuro:  Nonfocal, moving all extremities spontaneously Psych: Normal affect     Labs & Radiologic Studies    CBC No results for input(s): WBC, NEUTROABS, HGB, HCT, MCV, PLT in the last 72 hours. Basic Metabolic Panel Recent Labs    10/15/18 0707  NA 140  K 3.6  CL 107  CO2 27  GLUCOSE 114*  BUN 23  CREATININE 0.91  CALCIUM 9.1   Liver Function Tests No results for input(s): AST, ALT, ALKPHOS, BILITOT, PROT, ALBUMIN in the last 72 hours. No results for input(s): LIPASE, AMYLASE in the last 72 hours. Cardiac Enzymes No results for input(s): CKTOTAL, CKMB, CKMBINDEX, TROPONINI in the last 72 hours. BNP Invalid input(s): POCBNP D-Dimer No results for input(s): DDIMER in the last 72 hours. Hemoglobin A1C No results for input(s): HGBA1C in the last 72 hours. Fasting Lipid Panel No results for input(s): CHOL, HDL, LDLCALC, TRIG, CHOLHDL, LDLDIRECT in the last 72 hours. Thyroid Function Tests No results for input(s): TSH, T4TOTAL, T3FREE, THYROIDAB in the last 72  hours.  Invalid input(s): FREET3 _____________  No results found. Disposition   Patient was seen and examined by Dr. Radford Pax who deemed patient as stable for discharge. Follow-up has been arranged. Discharge medications as listed below.   Follow-up Plans & Appointments    Follow-up Information    Wellington Hampshire, MD Follow up on 11/11/2018.   Specialty:  Cardiology Why:  Please arrive 15 minutes early for your 10:40am appointment Contact information: [place] [address] [phone]        [name], MD Follow up on 11/03/2018.   Specialty:  Cardiology Why:  Please arrive 15 minutes early for your 2:40pm appointment Contact information: [address] [place] [address] [phone]        Cecil Northline Follow up.   Specialty:  Cardiology Why:  The office will contact you directly with an appointment date/time for follow-up ultrasounds/ABI's which should be done in 1 week. Please call the office if you do not hear from them in 1-2 days.  Contact information: [address] [place] [phone]         Discharge Instructions    Diet - low sodium heart healthy   Complete by:  As directed    Increase activity slowly   Complete by:  As directed       Discharge Medications   Allergies as of 10/16/2018      Reactions   Morphine And  Related Nausea And Vomiting      Medication List    TAKE these medications   amLODipine 2.5 MG tablet Commonly known as:  NORVASC Take 1 tablet (2.5 mg total) by mouth daily.   aspirin 81 MG tablet Take 1 tablet (81 mg total) by mouth daily.   atenolol 25 MG tablet Commonly known as:  TENORMIN Take 0.5 tablets (12.5 mg total) by mouth daily.   atorvastatin 80 MG tablet Commonly known as:  LIPITOR Take 80 mg by mouth daily.   CALCIUM + VITAMIN D3 PO Take 1 tablet by mouth 3 (three) times a week.   clopidogrel 75  MG tablet Commonly known as:  PLAVIX Take 1 tablet (75 mg total) by mouth daily with breakfast.   colesevelam 625 MG tablet Commonly known as:  WELCHOL Take 1,875 mg by mouth 2 (two) times daily with a meal.   hydrochlorothiazide 25 MG tablet Commonly known as:  HYDRODIURIL Take 25 mg by mouth daily.   levothyroxine 100 MCG tablet Commonly known as:  SYNTHROID, LEVOTHROID Take 100 mcg by mouth daily before breakfast.   nitroGLYCERIN 0.4 MG SL tablet Commonly known as:  NITROSTAT Place 1 tablet (0.4 mg total) under the tongue every 5 (five) minutes as needed for chest pain.   sertraline 50 MG tablet Commonly known as:  ZOLOFT Take 50 mg by mouth daily.   WOMENS MULTIVITAMIN PO Take 1 tablet by mouth 3 (three) times a week.        Outstanding Labs/Studies   Aorta/IVC/Iliac Dopplers and ABIs ordered - scheduler to contact patient with an appointment date/time  Duration of Discharge Encounter   Greater than 30 minutes including physician time.  Signed, Abigail Butts PA-C 10/16/2018, 8:32 AM

## 2018-10-16 NOTE — Discharge Instructions (Signed)

## 2018-10-29 ENCOUNTER — Ambulatory Visit (HOSPITAL_BASED_OUTPATIENT_CLINIC_OR_DEPARTMENT_OTHER)
Admit: 2018-10-29 | Discharge: 2018-10-29 | Disposition: A | Discharge status: Home / Self Care | Payer: Medicare HMO | Attending: Cardiology | Admitting: Cardiology

## 2018-10-29 ENCOUNTER — Ambulatory Visit (HOSPITAL_COMMUNITY)
Admission: RE | Admit: 2018-10-29 | Discharge: 2018-10-29 | Disposition: A | Discharge status: Home / Self Care | Payer: Medicare HMO | Source: Ambulatory Visit | Attending: Cardiology | Admitting: Cardiology

## 2018-10-29 DIAGNOSIS — I739 Peripheral vascular disease, unspecified: Secondary | ICD-10-CM | POA: Insufficient documentation

## 2018-11-03 ENCOUNTER — Ambulatory Visit: Payer: Medicare HMO | Admitting: Cardiovascular Disease

## 2018-11-03 ENCOUNTER — Encounter: Payer: Self-pay | Admitting: Cardiovascular Disease

## 2018-11-03 VITALS — BP 150/74 | HR 54 | Ht 62.0 in | Wt 127.8 lb

## 2018-11-03 DIAGNOSIS — E782 Mixed hyperlipidemia: Secondary | ICD-10-CM

## 2018-11-03 DIAGNOSIS — I25119 Atherosclerotic heart disease of native coronary artery with unspecified angina pectoris: Secondary | ICD-10-CM

## 2018-11-03 DIAGNOSIS — I10 Essential (primary) hypertension: Secondary | ICD-10-CM | POA: Diagnosis not present

## 2018-11-03 DIAGNOSIS — I739 Peripheral vascular disease, unspecified: Secondary | ICD-10-CM | POA: Diagnosis not present

## 2018-11-03 MED ORDER — ATORVASTATIN CALCIUM 80 MG PO TABS: 80.0000 mg | ORAL_TABLET | Freq: Every day | ORAL | 3 refills | Status: DC

## 2018-11-03 MED ORDER — ATENOLOL 25 MG PO TABS: 12.5000 mg | ORAL_TABLET | Freq: Every day | ORAL | 3 refills | Status: DC

## 2018-11-03 MED ORDER — AMLODIPINE BESYLATE 2.5 MG PO TABS: 2.5000 mg | ORAL_TABLET | Freq: Every day | ORAL | 3 refills | Status: DC

## 2018-11-03 MED ORDER — CLOPIDOGREL BISULFATE 75 MG PO TABS: 75.0000 mg | ORAL_TABLET | Freq: Every day | ORAL | 3 refills | Status: DC

## 2018-11-03 NOTE — Progress Notes (Signed)
Cardiology Office Note:    Date:  11/03/2018   ID:  Erica [name], DOB May 11, 1941, MRN [id number]  PCP:  Mayra Neer, MD  Cardiologist:  [name], MD  Electrophysiologist:  None   Referring MD: Mayra Neer, MD   Chief Complaint  Patient presents with  . Coronary Artery Disease    History of Present Illness:    Erica [name] is a 77 y.o. female with a hx of coronary artery disease, presenting for follow-up evaluation.  The patient has a history of multivessel CABG in 2011.  She underwent cardiac catheterization earlier this year because of progressive angina.  She underwent balloon angioplasty of the right coronary artery for treatment of severe in-stent restenosis.  She then underwent atherectomy and stenting of the proximal LAD and staged fashion.  She also has peripheral arterial disease and she underwent bilateral iliac stenting by Dr. Fletcher Anon October 15, 2018.  The patient has not had any recent chest pain or pressure since undergoing PCI.  Her claudication symptoms are also greatly improved.  States that she is able to walk longer now that she has been able to do in a long time.  She denies shortness of breath, edema, or heart palpitations.  She has had some trouble with labile blood pressure readings.  She has had several days where her blood pressure has been less than 90 mmHg.  She admits to some weakness and fatigue when this occurs.  Past Medical History:  Diagnosis Date  . Anxiety   . Chronic mid back pain   . Coronary artery disease    status post multiple percutaneous interventions  . Depression   . History of kidney stones 1990s X 1; 2016   . Hyperlipidemia   . Hypertension   . Hypothyroidism   . Myocardial infarction ([place]) 01/2010  . Osteoarthritis    "hands, back" (10/15/2018)    Past Surgical History:  Procedure Laterality Date  . ABDOMINAL AORTOGRAM W/LOWER EXTREMITY  10/15/2018  . ABDOMINAL AORTOGRAM W/LOWER EXTREMITY N/A 10/15/2018   Procedure: ABDOMINAL AORTOGRAM W/LOWER EXTREMITY;  Surgeon: Wellington Hampshire, MD;  Location: [facility];  Service: Cardiovascular;  Laterality: N/A;  . CARDIAC CATHETERIZATION  12-2008  . CATARACT EXTRACTION W/ INTRAOCULAR LENS IMPLANT Bilateral   . CORONARY ANGIOPLASTY WITH STENT PLACEMENT  2006    right coronary artery with drug-eluting stent  . CORONARY ANGIOPLASTY WITH STENT PLACEMENT  1998    bare-metal stent to the right coronary artery  . CORONARY ANGIOPLASTY WITH STENT PLACEMENT  2008   drug-eluting stent placed in the left circumflex  . CORONARY ARTERY BYPASS GRAFT  01-25-10   CABG X4  . CORONARY ATHERECTOMY N/A 07/16/2018   Procedure: CORONARY ATHERECTOMY;  Surgeon: [name], MD;  Location: [facility];  Service: Cardiovascular;  Laterality: N/A;  . CORONARY BALLOON ANGIOPLASTY N/A 06/04/2018   Procedure: CORONARY BALLOON ANGIOPLASTY;  Surgeon: [name], MD;  Location: [facility];  Service: Cardiovascular;  Laterality: N/A;  . CYSTOSCOPY WITH RETROGRADE PYELOGRAM, URETEROSCOPY AND STENT PLACEMENT Right 10/18/2015   Procedure: CYSTOSCOPY WITH RETROGRADE PYELOGRAM,  AND STENT PLACEMENT;  Surgeon: Festus Aloe, MD;  Location: [facility];  Service: Urology;  Laterality: Right;  . CYSTOSCOPY/URETEROSCOPY/HOLMIUM LASER/STENT PLACEMENT Right 11/29/2015   Procedure: RIGHT URETEROSCOPY/RIGHT RETROGRADE PYELOGRAM/ RIGHT STENT REPLACEMENT;  Surgeon: Festus Aloe, MD;  Location: [facility];  Service: Urology;  Laterality: Right;  . FOOT SURGERY Bilateral 2008-2016   "bones shortened prox to  great  toes"  . HAMMER TOE SURGERY Right 2008   2nd and 3rd digits  . INTRAVASCULAR PRESSURE WIRE/FFR STUDY N/A 06/04/2018   Procedure: INTRAVASCULAR PRESSURE WIRE/FFR STUDY;  Surgeon: [name], MD;  Location: [facility];  Service: Cardiovascular;  Laterality: N/A;  . LEFT HEART CATH AND CORS/GRAFTS ANGIOGRAPHY N/A 06/04/2018   Procedure: LEFT  HEART CATH AND CORS/GRAFTS ANGIOGRAPHY;  Surgeon: [name], MD;  Location: [facility];  Service: Cardiovascular;  Laterality: N/A;  . PERIPHERAL VASCULAR INTERVENTION Bilateral 10/15/2018  . PERIPHERAL VASCULAR INTERVENTION Bilateral 10/15/2018   Procedure: PERIPHERAL VASCULAR INTERVENTION;  Surgeon: Wellington Hampshire, MD;  Location: [facility];  Service: Cardiovascular;  Laterality: Bilateral;  . ULTRASOUND GUIDANCE FOR VASCULAR ACCESS  06/04/2018   Procedure: Ultrasound Guidance For Vascular Access;  Surgeon: [name], MD;  Location: [facility];  Service: Cardiovascular;;    Current Medications: Current Meds  Medication Sig  . amLODipine (NORVASC) 2.5 MG tablet Take 1 tablet (2.5 mg total) by mouth daily.  Marland Kitchen aspirin 81 MG tablet Take 1 tablet (81 mg total) by mouth daily.  Marland Kitchen atenolol (TENORMIN) 25 MG tablet Take 0.5 tablets (12.5 mg total) by mouth daily.  Marland Kitchen atorvastatin (LIPITOR) 80 MG tablet Take 1 tablet (80 mg total) by mouth daily.  . Calcium Carb-Cholecalciferol (CALCIUM + VITAMIN D3 PO) Take 1 tablet by mouth 3 (three) times a week.  . clopidogrel (PLAVIX) 75 MG tablet Take 1 tablet (75 mg total) by mouth daily with breakfast.  . colesevelam (WELCHOL) 625 MG tablet Take 1,875 mg by mouth 2 (two) times daily with a meal.   . levothyroxine (SYNTHROID, LEVOTHROID) 100 MCG tablet Take 100 mcg by mouth daily before breakfast.   . Multiple Vitamins-Minerals (WOMENS MULTIVITAMIN PO) Take 1 tablet by mouth 3 (three) times a week.  . nitroGLYCERIN (NITROSTAT) 0.4 MG SL tablet Place 1 tablet (0.4 mg total) under the tongue every 5 (five) minutes as needed for chest pain.  Marland Kitchen sertraline (ZOLOFT) 50 MG tablet Take 50 mg by mouth daily.   . [DISCONTINUED] amLODipine (NORVASC) 2.5 MG tablet Take 1 tablet (2.5 mg total) by mouth daily.  . [DISCONTINUED] atenolol (TENORMIN) 25 MG tablet Take 0.5 tablets (12.5 mg total) by mouth daily.  . [DISCONTINUED] atorvastatin  (LIPITOR) 80 MG tablet Take 80 mg by mouth daily.  . [DISCONTINUED] clopidogrel (PLAVIX) 75 MG tablet Take 1 tablet (75 mg total) by mouth daily with breakfast.  . [DISCONTINUED] hydrochlorothiazide (HYDRODIURIL) 25 MG tablet Take 25 mg by mouth daily.     Allergies:   Morphine and related   Social History   Socioeconomic History  . Marital status: Widowed    Spouse name: Not on file  . Number of children: Not on file  . Years of education: Not on file  . Highest education level: Not on file  Occupational History  . Occupation: Glass blower/designer  Social Needs  . Financial resource strain: Not on file  . Food insecurity:    Worry: Not on file    Inability: Not on file  . Transportation needs:    Medical: Not on file    Non-medical: Not on file  Tobacco Use  . Smoking status: Former Smoker    Packs/day: 1.00    Years: 37.00    Pack years: 37.00    Types: Cigarettes    Last attempt to quit: 11/22/1996    Years since quitting: 21.9  . Smokeless tobacco: Never Used  Substance and Sexual Activity  . Alcohol use: Yes    Comment: 10/15/2018 "COUPLE DRINKS/YEAR  . Drug use: Never  . Sexual activity: Not Currently  Lifestyle  . Physical activity:    Days per week: Not on file    Minutes per session: Not on file  . Stress: Not on file  Relationships  . Social connections:    Talks on phone: Not on file    Gets together: Not on file    Attends religious service: Not on file    Active member of club or organization: Not on file    Attends meetings of clubs or organizations: Not on file    Relationship status: Not on file  Other Topics Concern  . Not on file  Social History Narrative  . Not on file     Family History: The patient's family history includes Other in her unknown relative; Other (age of onset: 72) in her mother.  ROS:   Please see the history of present illness.    Positive for easy bruising.  All other systems reviewed and are negative.  EKGs/Labs/Other  Studies Reviewed:    EKG:  EKG is not ordered today.   Recent Labs: 07/11/2018: ALT 25 09/23/2018: Hemoglobin 13.8; Platelets 261 10/16/2018: BUN 13; Creatinine, Ser 0.83; Potassium 3.7; Sodium 138  Recent Lipid Panel    Component Value Date/Time   CHOL  01/09/2010 0506    131        ATP III CLASSIFICATION:  <200     mg/dL   Desirable  200-239  mg/dL   Borderline High  >=240    mg/dL   High          TRIG 87 01/09/2010 0506   HDL 48 01/09/2010 0506   CHOLHDL 2.7 01/09/2010 0506   VLDL 17 01/09/2010 0506   LDLCALC  01/09/2010 0506    66        Total Cholesterol/HDL:CHD Risk Coronary Heart Disease Risk Table                     Men   Women  1/2 Average Risk   3.4   3.3  Average Risk       5.0   4.4  2 X Average Risk   9.6   7.1  3 X Average Risk  23.4   11.0        Use the calculated Patient Ratio above and the CHD Risk Table to determine the patient's CHD Risk.        ATP III CLASSIFICATION (LDL):  <100     mg/dL   Optimal  100-129  mg/dL   Near or Above                    Optimal  130-159  mg/dL   Borderline  160-189  mg/dL   High  >190     mg/dL   Very High    Physical Exam:    VS:  BP (!) 150/74   Pulse (!) 54   Ht 5\' 2"  (1.575 m)   Wt 127 lb 12.8 oz (58 kg)   SpO2 98%   BMI 23.37 kg/m     Wt Readings from Last 3 Encounters:  11/03/18 127 lb 12.8 oz (58 kg)  10/16/18 125 lb 10.6 oz (57 kg)  09/23/18 125 lb 12.8 oz (57.1 kg)    GEN:  Well nourished, well developed in no acute distress HEENT: Normal NECK: No  JVD; Left carotid bruit. LYMPHATICS: No lymphadenopathy CARDIAC: RRR, no murmurs, rubs, gallops RESPIRATORY:  Clear to auscultation without rales, wheezing or rhonchi  ABDOMEN: Soft, non-tender, non-distended MUSCULOSKELETAL:  No edema; No deformity  SKIN: Warm and dry NEUROLOGIC:  Alert and oriented x 3 PSYCHIATRIC:  Normal affect   ASSESSMENT:    1. Coronary artery disease involving native coronary artery of native heart with angina  pectoris ([place])   2. PAD (peripheral artery disease) ([place])   3. Essential hypertension   4. Mixed hyperlipidemia    PLAN:    In order of problems listed above:  1. The patient appears to be stable with respect to her coronary artery disease.  I recommended that she discontinue aspirin when she is out to 6 months from PCI.  She is having extensive bruising.  I would like her to stay on clopidogrel long-term with her extensive coronary and peripheral arterial disease, as long as she can tolerate clopidogrel.  I would like her to see Truitt Merle in 3 months. 2. Doing much better after undergoing iliac stenting.  Claudication symptoms have significantly improved. 3. Blood pressure is labile.  I am more concerned about her low readings.  She only has a few readings greater than 140 mmHg.  I have asked her to discontinue hydrochlorothiazide.  She should continue on amlodipine and low-dose atenolol.  If her blood pressure elevates, her amlodipine dose could be adjusted. 4. Continue atorvastatin 80 mg daily.  Medication Adjustments/Labs and Tests Ordered: Current medicines are reviewed at length with the patient today.  Concerns regarding medicines are outlined above.  No orders of the defined types were placed in this encounter.  Meds ordered this encounter  Medications  . amLODipine (NORVASC) 2.5 MG tablet    Sig: Take 1 tablet (2.5 mg total) by mouth daily.    Dispense:  90 tablet    Refill:  3  . atenolol (TENORMIN) 25 MG tablet    Sig: Take 0.5 tablets (12.5 mg total) by mouth daily.    Dispense:  30 tablet    Refill:  3  . clopidogrel (PLAVIX) 75 MG tablet    Sig: Take 1 tablet (75 mg total) by mouth daily with breakfast.    Dispense:  90 tablet    Refill:  3  . atorvastatin (LIPITOR) 80 MG tablet    Sig: Take 1 tablet (80 mg total) by mouth daily.    Dispense:  90 tablet    Refill:  3    Patient Instructions  Medication Instructions:  1) STOP HCTZ (Hydrodiuryl)    Labwork: None  Testing/Procedures: None  Follow-Up: You have an appointment with Truitt Merle on Monday, February 02, 2019 at 1:30PM. Please arrive by 1:15PM.   Any Other Special Instructions Will Be Listed Below (If Applicable).     If you need a refill on your cardiac medications before your next appointment, please call your pharmacy.     Signed, [name], MD  11/03/2018 5:09 PM    [place]

## 2018-11-03 NOTE — Patient Instructions (Addendum)
Medication Instructions:  1) STOP HCTZ (Hydrodiuryl)   Labwork: None  Testing/Procedures: None  Follow-Up: You have an appointment with Truitt Merle on Monday, February 02, 2019 at 1:30PM. Please arrive by 1:15PM.   Any Other Special Instructions Will Be Listed Below (If Applicable).     If you need a refill on your cardiac medications before your next appointment, please call your pharmacy.

## 2018-11-04 ENCOUNTER — Telehealth: Payer: Self-pay | Admitting: *Deleted

## 2018-11-04 DIAGNOSIS — I739 Peripheral vascular disease, unspecified: Secondary | ICD-10-CM

## 2018-11-04 NOTE — Telephone Encounter (Signed)
Patient made aware of results and verbalized understanding. Repeat orders placed.    

## 2018-11-04 NOTE — Telephone Encounter (Signed)
-----   Message from Wellington Hampshire, MD sent at 10/30/2018 11:25 AM EST ----- Inform patient that ABI improved to normal with patent iliac stents.  Repeat same studies in 6 months.

## 2018-11-11 ENCOUNTER — Ambulatory Visit: Payer: Medicare HMO | Admitting: Cardiovascular Disease

## 2018-11-11 ENCOUNTER — Encounter: Payer: Self-pay | Admitting: Cardiovascular Disease

## 2018-11-11 VITALS — BP 122/72 | HR 48 | Ht 62.0 in | Wt 127.8 lb

## 2018-11-11 DIAGNOSIS — I739 Peripheral vascular disease, unspecified: Secondary | ICD-10-CM | POA: Diagnosis not present

## 2018-11-11 DIAGNOSIS — E785 Hyperlipidemia, unspecified: Secondary | ICD-10-CM

## 2018-11-11 DIAGNOSIS — I10 Essential (primary) hypertension: Secondary | ICD-10-CM | POA: Diagnosis not present

## 2018-11-11 DIAGNOSIS — I2510 Atherosclerotic heart disease of native coronary artery without angina pectoris: Secondary | ICD-10-CM

## 2018-11-11 DIAGNOSIS — I779 Disorder of arteries and arterioles, unspecified: Secondary | ICD-10-CM

## 2018-11-11 NOTE — Patient Instructions (Signed)
Medication Instructions:  Your physician recommends that you continue on your current medications as directed. Please refer to the Current Medication list given to you today.  If you need a refill on your cardiac medications before your next appointment, please call your pharmacy.   Lab work: none If you have labs (blood work) drawn today and your tests are completely normal, you will receive your results only by: Marland Kitchen MyChart Message (if you have MyChart) OR . A paper copy in the mail If you have any lab test that is abnormal or we need to change your treatment, we will call you to review the results.  Testing/Procedures: none  Follow-Up: At [facility], you and your health needs are our priority.  As part of our continuing mission to provide you with exceptional heart care, we have created designated Provider Care Teams.  These Care Teams include your primary Cardiologist (physician) and Advanced Practice Providers (APPs -  Physician Assistants and Nurse Practitioners) who all work together to provide you with the care you need, when you need it. You will need a follow up appointment in 6 months.  Please call our office 2 months in advance to schedule this appointment.  You may see Dr. Fletcher Anon - PV or one of the following Advanced Practice Providers on your designated Care Team:   Kerin Ransom, PA-C Roby Lofts, [place] . Sande Rives, PA-C  Any Other Special Instructions Will Be Listed Below (If Applicable).

## 2018-11-11 NOTE — Progress Notes (Signed)
Cardiology Office Note   Date:  09/23/2018   ID:  Erica [name], DOB May 03, 1941, MRN [id number]  PCP:  Mayra Neer, MD  Cardiologist:  Dr. Burt Knack   Chief Complaint  Patient presents with  . Follow-up    pt denied chest pain      History of Present Illness: Erica [name] is a 77 y.o. female who is here today for follow-up visit regarding peripheral arterial disease.    She has known history of coronary artery disease status post CABG in 2011, mild to moderate carotid disease, hyperlipidemia and essential hypertension.  She quit smoking at the age of 89. She is followed for left calf claudication. Cardiac catheterization in July showed significant restenosis in the right coronary artery which was treated with cutting balloon angioplasty.  There was significant calcified LAD disease which was treated with staged atherectomy and drug-eluting stent placement in August. No chest pain or shortness of breath.    She was seen recently for worsening bilateral leg claudication especially on the left side.  I proceeded with angiography which showed moderate distal aortic stenosis above the iliac bifurcation not significant by gradient, 60% stenosis of the right common iliac artery and mild common femoral artery disease and no significant infrainguinal disease.  On the left, there was significant left common iliac artery stenosis and mild left common femoral artery stenosis.  I performed successful bilateral common iliac artery kissing stent placement extending into the distal aorta.  She had bruising in both groins worse on the left side but that has resolved completely.  She reports resolution of claudication.  She was seen recently by Dr. Burt Knack and her hydrochlorothiazide was discontinued due to low blood pressure.  Diagnosis Date  . Anxiety   . Chronic mid back pain   . Coronary artery disease    status post multiple percutaneous interventions  . Depression   . History of kidney  stones 1990s X 1; 2016   . Hyperlipidemia   . Hypertension   . Hypothyroidism   . Myocardial infarction ([place]) 01/2010  . Osteoarthritis    "hands, back" (06/04/2018)    Past Surgical History:  Procedure Laterality Date  . CARDIAC CATHETERIZATION  12-2008  . CATARACT EXTRACTION W/ INTRAOCULAR LENS IMPLANT Bilateral   . CORONARY ANGIOPLASTY WITH STENT PLACEMENT  2006    right coronary artery with drug-eluting stent  . CORONARY ANGIOPLASTY WITH STENT PLACEMENT  1998    bare-metal stent to the right coronary artery  . CORONARY ANGIOPLASTY WITH STENT PLACEMENT  2008   drug-eluting stent placed in the left circumflex  . CORONARY ARTERY BYPASS GRAFT  01-25-10   CABG X4  . CORONARY ATHERECTOMY N/A 07/16/2018   Procedure: CORONARY ATHERECTOMY;  Surgeon: [name], MD;  Location: [facility];  Service: Cardiovascular;  Laterality: N/A;  . CORONARY BALLOON ANGIOPLASTY  06/04/2018   cutting balloon  . CORONARY BALLOON ANGIOPLASTY N/A 06/04/2018   Procedure: CORONARY BALLOON ANGIOPLASTY;  Surgeon: [name], MD;  Location: [facility];  Service: Cardiovascular;  Laterality: N/A;  . CYSTOSCOPY WITH RETROGRADE PYELOGRAM, URETEROSCOPY AND STENT PLACEMENT Right 10/18/2015   Procedure: CYSTOSCOPY WITH RETROGRADE PYELOGRAM,  AND STENT PLACEMENT;  Surgeon: Festus Aloe, MD;  Location: [facility];  Service: Urology;  Laterality: Right;  . CYSTOSCOPY/URETEROSCOPY/HOLMIUM LASER/STENT PLACEMENT Right 11/29/2015   Procedure: RIGHT URETEROSCOPY/RIGHT RETROGRADE PYELOGRAM/ RIGHT STENT REPLACEMENT;  Surgeon: Festus Aloe, MD;  Location[facility];  Service: Urology;  Laterality: Right;  .  FOOT SURGERY Bilateral 2008-2016   "bones shortened"  . INTRAVASCULAR PRESSURE WIRE/FFR STUDY N/A 06/04/2018   Procedure: INTRAVASCULAR PRESSURE WIRE/FFR STUDY;  Surgeon: [name], MD;  Location: [place] [facility];  Service: Cardiovascular;  Laterality: N/A;  . LEFT HEART CATH  AND CORS/GRAFTS ANGIOGRAPHY N/A 06/04/2018   Procedure: LEFT HEART CATH AND CORS/GRAFTS ANGIOGRAPHY;  Surgeon: [name], MD;  Location: [facility] [facility];  Service: Cardiovascular;  Laterality: N/A;  . ULTRASOUND GUIDANCE FOR VASCULAR ACCESS  06/04/2018   Procedure: Ultrasound Guidance For Vascular Access;  Surgeon: [name], MD;  Location: [place] [facility];  Service: Cardiovascular;;     Current Outpatient Medications  Medication Sig Dispense Refill  . amLODipine (NORVASC) 2.5 MG tablet Take 1 tablet (2.5 mg total) by mouth daily. 90 tablet 3  . aspirin 81 MG tablet Take 1 tablet (81 mg total) by mouth daily. 30 tablet 0  . atenolol (TENORMIN) 25 MG tablet Take 0.5 tablets (12.5 mg total) by mouth daily. 30 tablet 2  . atorvastatin (LIPITOR) 80 MG tablet Take 80 mg by mouth daily.    . clopidogrel (PLAVIX) 75 MG tablet Take 1 tablet (75 mg total) by mouth daily with breakfast. 90 tablet 2  . colesevelam (WELCHOL) 625 MG tablet Take 1,250 mg by mouth 2 (two) times daily with a meal.    . hydrochlorothiazide (HYDRODIURIL) 25 MG tablet Take 25 mg by mouth daily.    Marland Kitchen levothyroxine (SYNTHROID, LEVOTHROID) 100 MCG tablet Take 100 mcg by mouth daily.    . Multiple Vitamins-Minerals (WOMENS MULTIVITAMIN PO) Take 1 tablet by mouth 3 (three) times a week.    . nitroGLYCERIN (NITROSTAT) 0.4 MG SL tablet Place 1 tablet (0.4 mg total) under the tongue every 5 (five) minutes as needed for chest pain. 25 tablet 3  . sertraline (ZOLOFT) 50 MG tablet Take 50 mg by mouth daily.      No current facility-administered medications for this visit.     Allergies:   Morphine and related    Social History:  The patient  reports that she quit smoking about 21 years ago. Her smoking use included cigarettes. She has a 37.00 pack-year smoking history. She has never used smokeless tobacco. She reports that she drank alcohol. She reports that she does not use drugs.   Family History:  The patient's  family history includes Other in her unknown relative; Other (age of onset: 11) in her mother.    ROS:  Please see the history of present illness.   Otherwise, review of systems are positive for none.   All other systems are reviewed and negative.    PHYSICAL EXAM: VS:  BP (!) 143/62   Pulse (!) 49   Ht 5\' 2"  (1.575 m)   Wt 125 lb 12.8 oz (57.1 kg)   BMI 23.01 kg/m  , BMI Body mass index is 23.01 kg/m. GEN: Well nourished, well developed, in no acute distress  HEENT: normal  Neck: no JVD, or masses.  Faint right carotid bruit Cardiac: RRR; no murmurs, rubs, or gallops,no edema  Respiratory:  clear to auscultation bilaterally, normal work of breathing GI: soft, nontender, nondistended, + BS MS: no deformity or atrophy  Skin: warm and dry, no rash Neuro:  Strength and sensation are intact Psych: euthymic mood, full affect Vascular: Femoral pulses +2 bilaterally   EKG:  EKG is not ordered today.   Recent Labs: 07/11/2018: ALT 25 07/29/2018: BUN 15; Creatinine, Ser 0.88; Hemoglobin 13.8; Platelets 275;  Potassium 3.9; Sodium 144    Lipid Panel    Component Value Date/Time   CHOL  01/09/2010 0506    131        ATP III CLASSIFICATION:  <200     mg/dL   Desirable  200-239  mg/dL   Borderline High  >=240    mg/dL   High          TRIG 87 01/09/2010 0506   HDL 48 01/09/2010 0506   CHOLHDL 2.7 01/09/2010 0506   VLDL 17 01/09/2010 0506   LDLCALC  01/09/2010 0506    66        Total Cholesterol/HDL:CHD Risk Coronary Heart Disease Risk Table                     Men   Women  1/2 Average Risk   3.4   3.3  Average Risk       5.0   4.4  2 X Average Risk   9.6   7.1  3 X Average Risk  23.4   11.0        Use the calculated Patient Ratio above and the CHD Risk Table to determine the patient's CHD Risk.        ATP III CLASSIFICATION (LDL):  <100     mg/dL   Optimal  100-129  mg/dL   Near or Above                    Optimal  130-159  mg/dL   Borderline  160-189  mg/dL   High   >190     mg/dL   Very High      Wt Readings from Last 3 Encounters:  09/23/18 125 lb 12.8 oz (57.1 kg)  07/29/18 128 lb 6.4 oz (58.2 kg)  07/16/18 125 lb 10.6 oz (57 kg)      No flowsheet data found.    ASSESSMENT AND PLAN:  1.  Peripheral arterial disease: Status post recent successful bilateral common iliac artery stent placement.  Her claudication resolved.  Postprocedure ABI improved to normal.  Repeat vascular studies in 6 months.  She is already on dual antiplatelet therapy for CAD.   2.  Coronary artery disease involving native coronary arteries with other forms of angina: No recurrent chest pain after recent PCI to the right coronary artery and LAD.  3.  Renal artery stenosis: Noted on CTA on the right side.  No indication for revascularization given that her blood pressure is controlled with no heart failure or progressive chronic kidney disease.  4.  Essential hypertension: Blood pressure is reasonably controlled on current medications even after recent stopping of hydrochlorothiazide.  5.   Hyperlipidemia: Continue high-dose atorvastatin with a target LDL of less than 70.  6.  Carotid artery disease with mild to moderate bilateral disease on most recent Doppler in January 2019.  Recommend yearly Doppler.   Disposition:   FU with me in 6 months  Signed,  Kathlyn Sacramento, MD  09/23/2018 10:26 AM    [place]

## 2018-11-12 ENCOUNTER — Encounter (HOSPITAL_COMMUNITY): Payer: Self-pay

## 2018-12-26 DIAGNOSIS — R197 Diarrhea, unspecified: Secondary | ICD-10-CM | POA: Diagnosis not present

## 2018-12-26 DIAGNOSIS — I701 Atherosclerosis of renal artery: Secondary | ICD-10-CM | POA: Diagnosis not present

## 2018-12-26 DIAGNOSIS — I739 Peripheral vascular disease, unspecified: Secondary | ICD-10-CM | POA: Diagnosis not present

## 2018-12-26 DIAGNOSIS — R7301 Impaired fasting glucose: Secondary | ICD-10-CM | POA: Diagnosis not present

## 2018-12-26 DIAGNOSIS — F322 Major depressive disorder, single episode, severe without psychotic features: Secondary | ICD-10-CM | POA: Diagnosis not present

## 2018-12-26 DIAGNOSIS — E782 Mixed hyperlipidemia: Secondary | ICD-10-CM | POA: Diagnosis not present

## 2018-12-26 DIAGNOSIS — I119 Hypertensive heart disease without heart failure: Secondary | ICD-10-CM | POA: Diagnosis not present

## 2018-12-26 DIAGNOSIS — I2510 Atherosclerotic heart disease of native coronary artery without angina pectoris: Secondary | ICD-10-CM | POA: Diagnosis not present

## 2018-12-26 DIAGNOSIS — E039 Hypothyroidism, unspecified: Secondary | ICD-10-CM | POA: Diagnosis not present

## 2019-01-14 ENCOUNTER — Encounter: Payer: Self-pay | Admitting: Nurse Practitioner

## 2019-01-19 DIAGNOSIS — S6991XA Unspecified injury of right wrist, hand and finger(s), initial encounter: Secondary | ICD-10-CM | POA: Diagnosis not present

## 2019-01-19 DIAGNOSIS — S6992XA Unspecified injury of left wrist, hand and finger(s), initial encounter: Secondary | ICD-10-CM | POA: Diagnosis not present

## 2019-01-19 DIAGNOSIS — T1490XA Injury, unspecified, initial encounter: Secondary | ICD-10-CM | POA: Diagnosis not present

## 2019-01-19 DIAGNOSIS — S61412A Laceration without foreign body of left hand, initial encounter: Secondary | ICD-10-CM | POA: Diagnosis not present

## 2019-02-02 ENCOUNTER — Encounter: Payer: Self-pay | Admitting: Nurse Practitioner

## 2019-02-02 ENCOUNTER — Ambulatory Visit: Payer: Medicare HMO | Admitting: Nurse Practitioner

## 2019-02-02 VITALS — BP 160/68 | HR 56 | Ht 62.0 in | Wt 130.1 lb

## 2019-02-02 DIAGNOSIS — I2510 Atherosclerotic heart disease of native coronary artery without angina pectoris: Secondary | ICD-10-CM | POA: Diagnosis not present

## 2019-02-02 DIAGNOSIS — I10 Essential (primary) hypertension: Secondary | ICD-10-CM

## 2019-02-02 NOTE — Patient Instructions (Addendum)
We will be checking the following labs today - NONE   Medication Instructions:    Continue with your current medicines. BUT  Ok to stop aspirin   If you need a refill on your cardiac medications before your next appointment, please call your pharmacy.     Testing/Procedures To Be Arranged:  N/A  Follow-Up:   See Dr. Fletcher Anon in June  See Dr. Burt Knack in 6 months    At [facility], you and your health needs are our priority.  As part of our continuing mission to provide you with exceptional heart care, we have created designated Provider Care Teams.  These Care Teams include your primary Cardiologist (physician) and Advanced Practice Providers (APPs -  Physician Assistants and Nurse Practitioners) who all work together to provide you with the care you need, when you need it.  Special Instructions:  . None  Call the [place] office at [phone] if you have any questions, problems or concerns.

## 2019-02-02 NOTE — Progress Notes (Signed)
CARDIOLOGY OFFICE NOTE  Date:  02/02/2019    Erica [name] Date of Birth: Feb 16, 1941 Medical Record #[id number]  PCP:  Mayra Neer, MD  Cardiologist:  Servando Snare [facility] Cooper/Arida  Chief Complaint  Patient presents with  . Coronary Artery Disease    Follow up visit - seen for Dr. Burt Knack    History of Present Illness: Erica [name] is a 78 y.o. female who presents today for a follow up visit. Seen for Dr. Henreitta Cea.   She has ahistory of CAD with remoteCABG in 2011 with grafting of the LIMA to LAD, saphenous vein graft to PDA and PLA, and saphenous vein graft to OM- per Dr. Cyndia Bent.Her other issues include carotid disease, HLD and PAD - followed by Dr. Fletcher Anon.   Last Myoview was in January of 2018.   She underwent cardiac catheterization in 2019 because of progressive angina.  She underwent balloon angioplasty of the right coronary artery for treatment of severe in-stent restenosis.  She then underwent atherectomy and stenting of the proximal LAD and staged fashion.  She also has peripheral arterial disease and she underwent bilateral iliac stenting by Dr. Fletcher Anon on October 15, 2018.  Last seen here in December by both Dr. Burt Knack and Fletcher Anon - has been doing well. Excessive bruising noted - aspirin to be stopped 6 months post PCI.  No chest pain. Claudication has greatly improved. Still with some labile blood pressure readings but more lower readings overall - HCTZ was stopped by Dr. Burt Knack.   Comes in today. Here alone. She feels like she is doing well - has had some balance issues that she is working on. She has been on a recent trip last month - she had 2 falls while on her river cruise. Did require some stitches in a finger and was "bruised up". Still with some bruising in general. She notes that after her last intervention with Dr. Fletcher Anon - BP started going up some - 366'Y systolic - Norvasc was increased to 5 mg by her PCP. She has also been changed over to Crestor by her  PCP - she feels good on this. She otherwise, feels like she is doing ok. She shows me her BP readings off her phone - they are excellent for the most part.    Past Medical History:  Diagnosis Date  . Anxiety   . Chronic mid back pain   . Coronary artery disease    status post multiple percutaneous interventions  . Depression   . History of kidney stones 1990s X 1; 2016   . Hyperlipidemia   . Hypertension   . Hypothyroidism   . Myocardial infarction ([place]) 01/2010  . Osteoarthritis    "hands, back" (10/15/2018)    Past Surgical History:  Procedure Laterality Date  . ABDOMINAL AORTOGRAM W/LOWER EXTREMITY  10/15/2018  . ABDOMINAL AORTOGRAM W/LOWER EXTREMITY N/A 10/15/2018   Procedure: ABDOMINAL AORTOGRAM W/LOWER EXTREMITY;  Surgeon: Wellington Hampshire, MD;  Location: [place] [facility];  Service: Cardiovascular;  Laterality: N/A;  . CARDIAC CATHETERIZATION  12-2008  . CATARACT EXTRACTION W/ INTRAOCULAR LENS IMPLANT Bilateral   . CORONARY ANGIOPLASTY WITH STENT PLACEMENT  2006    right coronary artery with drug-eluting stent  . CORONARY ANGIOPLASTY WITH STENT PLACEMENT  1998    bare-metal stent to the right coronary artery  . CORONARY ANGIOPLASTY WITH STENT PLACEMENT  2008   drug-eluting stent placed in the left circumflex  . CORONARY ARTERY BYPASS GRAFT  01-25-10   CABG  X4  . CORONARY ATHERECTOMY N/A 07/16/2018   Procedure: CORONARY ATHERECTOMY;  Surgeon: [name], MD;  Location: [place] [facility];  Service: Cardiovascular;  Laterality: N/A;  . CORONARY BALLOON ANGIOPLASTY N/A 06/04/2018   Procedure: CORONARY BALLOON ANGIOPLASTY;  Surgeon: [name], MD;  Location: [place] [facility];  Service: Cardiovascular;  Laterality: N/A;  . CYSTOSCOPY WITH RETROGRADE PYELOGRAM, URETEROSCOPY AND STENT PLACEMENT Right 10/18/2015   Procedure: CYSTOSCOPY WITH RETROGRADE PYELOGRAM,  AND STENT PLACEMENT;  Surgeon: Festus Aloe, MD;  Location: [facility];  Service: Urology;  Laterality:  Right;  . CYSTOSCOPY/URETEROSCOPY/HOLMIUM LASER/STENT PLACEMENT Right 11/29/2015   Procedure: RIGHT URETEROSCOPY/RIGHT RETROGRADE PYELOGRAM/ RIGHT STENT REPLACEMENT;  Surgeon: Festus Aloe, MD;  Location: [facility];  Service: Urology;  Laterality: Right;  . FOOT SURGERY Bilateral 2008-2016   "bones shortened prox to  great toes"  . HAMMER TOE SURGERY Right 2008   2nd and 3rd digits  . INTRAVASCULAR PRESSURE WIRE/FFR STUDY N/A 06/04/2018   Procedure: INTRAVASCULAR PRESSURE WIRE/FFR STUDY;  Surgeon: [name], MD;  Location: [place] [facility];  Service: Cardiovascular;  Laterality: N/A;  . LEFT HEART CATH AND CORS/GRAFTS ANGIOGRAPHY N/A 06/04/2018   Procedure: LEFT HEART CATH AND CORS/GRAFTS ANGIOGRAPHY;  Surgeon: [name], MD;  Location: [place] [facility];  Service: Cardiovascular;  Laterality: N/A;  . PERIPHERAL VASCULAR INTERVENTION Bilateral 10/15/2018  . PERIPHERAL VASCULAR INTERVENTION Bilateral 10/15/2018   Procedure: PERIPHERAL VASCULAR INTERVENTION;  Surgeon: Wellington Hampshire, MD;  Location: [place] [facility];  Service: Cardiovascular;  Laterality: Bilateral;  . ULTRASOUND GUIDANCE FOR VASCULAR ACCESS  06/04/2018   Procedure: Ultrasound Guidance For Vascular Access;  Surgeon: [name], MD;  Location: [place] [facility];  Service: Cardiovascular;;     Medications: Current Meds  Medication Sig  . amLODipine (NORVASC) 5 MG tablet Take 5 mg by mouth daily.  Marland Kitchen aspirin 81 MG tablet Take 1 tablet (81 mg total) by mouth daily.  Marland Kitchen atenolol (TENORMIN) 25 MG tablet Take 0.5 tablets (12.5 mg total) by mouth daily.  . Calcium Carb-Cholecalciferol (CALCIUM + VITAMIN D3 PO) Take 1 tablet by mouth 3 (three) times a week.  . clopidogrel (PLAVIX) 75 MG tablet Take 1 tablet (75 mg total) by mouth daily with breakfast.  . colesevelam (WELCHOL) 625 MG tablet Take 1,875 mg by mouth 2 (two) times daily with a meal.   . levothyroxine (SYNTHROID, LEVOTHROID) 100 MCG  tablet Take 100 mcg by mouth daily before breakfast.   . Multiple Vitamins-Minerals (WOMENS MULTIVITAMIN PO) Take 1 tablet by mouth 3 (three) times a week.  . nitroGLYCERIN (NITROSTAT) 0.4 MG SL tablet Place 1 tablet (0.4 mg total) under the tongue every 5 (five) minutes as needed for chest pain.  . rosuvastatin (CRESTOR) 40 MG tablet Take 40 mg by mouth daily.  . sertraline (ZOLOFT) 50 MG tablet Take 50 mg by mouth daily.   . [DISCONTINUED] atorvastatin (LIPITOR) 80 MG tablet Take 1 tablet (80 mg total) by mouth daily.     Allergies: Allergies  Allergen Reactions  . Morphine And Related Nausea And Vomiting    Social History: The patient  reports that she quit smoking about 22 years ago. Her smoking use included cigarettes. She has a 37.00 pack-year smoking history. She has never used smokeless tobacco. She reports current alcohol use. She reports that she does not use drugs.   Family History: The patient's family history includes Other in an other family member; Other (age of onset: 67) in her  mother.   Review of Systems: Please see the history of present illness.   Otherwise, the review of systems is positive for none.   All other systems are reviewed and negative.   Physical Exam: VS:  BP (!) 160/68 (BP Location: Left Arm, Patient Position: Sitting, Cuff Size: Normal)   Pulse (!) 56   Ht 5\' 2"  (1.575 m)   Wt 130 lb 1.9 oz (59 kg)   SpO2 100% Comment: at rest  BMI 23.80 kg/m  .  BMI Body mass index is 23.8 kg/m.  Wt Readings from Last 3 Encounters:  02/02/19 130 lb 1.9 oz (59 kg)  11/11/18 127 lb 12.8 oz (58 kg)  11/03/18 127 lb 12.8 oz (58 kg)    General: Pleasant. Well developed, well nourished and in no acute distress. She looks younger than her stated age.   HEENT: Normal.  Neck: Supple, no JVD, carotid bruits, or masses noted.  Cardiac: Regular rate and rhythm. No murmurs, rubs, or gallops. No edema.  Respiratory:  Lungs are clear to auscultation bilaterally with  normal work of breathing.  GI: Soft and nontender.  MS: No deformity or atrophy. Gait and ROM intact.  Skin: Warm and dry. Color is normal.  Neuro:  Strength and sensation are intact and no gross focal deficits noted.  Psych: Alert, appropriate and with normal affect.   LABORATORY DATA:  EKG:  EKG is not ordered today.  Lab Results  Component Value Date   WBC 6.3 09/23/2018   HGB 13.8 09/23/2018   HCT 40.7 09/23/2018   PLT 261 09/23/2018   GLUCOSE 108 (H) 10/16/2018   CHOL  01/09/2010    131        ATP III CLASSIFICATION:  <200     mg/dL   Desirable  200-239  mg/dL   Borderline High  >=240    mg/dL   High          TRIG 87 01/09/2010   HDL 48 01/09/2010   LDLCALC  01/09/2010    66        Total Cholesterol/HDL:CHD Risk Coronary Heart Disease Risk Table                     Men   Women  1/2 Average Risk   3.4   3.3  Average Risk       5.0   4.4  2 X Average Risk   9.6   7.1  3 X Average Risk  23.4   11.0        Use the calculated Patient Ratio above and the CHD Risk Table to determine the patient's CHD Risk.        ATP III CLASSIFICATION (LDL):  <100     mg/dL   Optimal  100-129  mg/dL   Near or Above                    Optimal  130-159  mg/dL   Borderline  160-189  mg/dL   High  >190     mg/dL   Very High   ALT 25 07/11/2018   AST 20 07/11/2018   NA 138 10/16/2018   K 3.7 10/16/2018   CL 105 10/16/2018   CREATININE 0.83 10/16/2018   BUN 13 10/16/2018   CO2 26 10/16/2018   TSH 1.488 Test methodology is 3rd generation TSH 01/08/2010   INR 1.33 10/18/2015   HGBA1C (H) 01/23/2010    6.2 (NOTE) The ADA  recommends the following therapeutic goal for glycemic control related to Hgb A1c measurement: Goal of therapy: <6.5 Hgb A1c  Reference: American Diabetes Association: Clinical Practice Recommendations 2010, Diabetes Care, 2010, 33: (Suppl  1).     BNP (last 3 results) No results for input(s): BNP in the last 8760 hours.  ProBNP (last 3 results) No results  for input(s): PROBNP in the last 8760 hours.   Other Studies Reviewed Today:  ABDOMINAL AORTOGRAM W/LOWER EXTREMITY 10/2018  PERIPHERAL VASCULAR INTERVENTION  Conclusion   1.  Moderate distal aortic stenosis above the iliac bifurcation not significant by gradient. 2.  Right lower extremity: Borderline significant right common iliac artery stenosis with mild common femoral artery disease and no significant SFA popliteal disease and three-vessel runoff below the knee. 3.  Left lower extremity: Significant left common iliac artery stenosis with mild common femoral artery disease, no significant SFA and popliteal disease with three-vessel runoff below the knee. 4.  Successful bilateral common iliac artery kissing stent placement extending into the distal aorta.  Recommendations: The patient is already on dual antiplatelet therapy. Continue aggressive treatment of risk factors. Given bilateral groin access and small hematoma on the left side, will observe the patient overnight.    CORONARY ATHERECTOMY 07/2018  Conclusion   Successful PCI of the LAD using orbital atherectomy and stenting with a 2.75x20 mm Synergy DES  Recommend uninterrupted dual antiplatelet therapy with Aspirin 81mg  daily and Clopidogrel 75mg  dailyfor a minimum of 12 months with proximal LAD stenting and recent RCA angioplasty of severe in-stent restenosis.   Cath: 06/04/18  Conclusion     Ost Cx to Prox Cx lesion is 100% stenosed.  SVG and is normal in caliber.  LIMA.  Ost LAD to Prox LAD lesion is 75% stenosed.  Mid RCA lesion is 75% stenosed.  Scoring balloon angioplasty was performed using a BALLOON WOLVERINE 2.50X10.  Post intervention, there is a 10% residual stenosis.  Ost RCA to Prox RCA lesion is 40% stenosed.  Seq SVG-.  Origin lesion before RPDA is 100% stenosed.  The left ventricular systolic function is normal.  LV end diastolic pressure is normal.  1. Severe multivessel CAD  with total occlusion of the LCx, severe in-stent restenosis in the RCA, and moderately severe proximal LAD stenosis 2. 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 Mild segmental LV contraction abnormality with preserved LVEF of 55% 4. FFR of the proximal LAD = 0.77 5. FFR of the mid-RCA = 0.75, treated successfully with cutting balloon angioplasty  Recommend: Staged PCI of the LAD - will require orbital atherectomy and stenting Should be OK for DC home tomorrow am and will schedule for outpatient PCI of the LAD with atherectomy  Recommend uninterrupted dual antiplatelet therapy with Aspirin 81mg  daily and Clopidogrel 75mg  daily for a minimum of 6 months (stable ischemic heart disease - Class I recommendation).   Echo Study Conclusions 2016  - Left ventricle: The cavity size was normal. Wall thickness was increased in a pattern of mild LVH. Systolic function was vigorous. The estimated ejection fraction was in the range of 60% to 65%. Wall motion was normal; there were no regional wall motion abnormalities. Features are consistent with a pseudonormal left ventricular filling pattern, with concomitant abnormal relaxation and increased filling pressure (grade 2 diastolic dysfunction). - Mitral valve: There was mild regurgitation. - Left atrium: The atrium was moderately to severely dilated. - Right ventricle: The cavity size  was normal. Wall thickness was normal. Systolic function was mildly reduced. - Right atrium: The atrium was mildly dilated. Central venous pressure (est): 8 mm Hg. - Atrial septum: No defect or patent foramen ovale was identified. - Tricuspid valve: There was mild-moderate regurgitation. - Pulmonary arteries: Systolic pressure was increased. PA peak pressure: 49 mm Hg (S). - Inferior vena cava: The vessel was dilated. The respirophasic diameter changes were in the normal  range (>= 50%).    Assessment/Plan:  1. CAD with prior CABG in 2011 - with s/p PCI to the RCA as well as s/p orbital athrectomy and DES to the LAD from 2019 - she is doing well clinically without symptoms. Continue with CV risk factor modification. Her bruising continues - will stop her aspirin - discussed with Dr. Burt Knack and he agrees.   2. Resting bradycardia - not symptomatic.   3. HTN - BP readings from home are excellent. No changes made today.   4. HLD - now on Crestor - labs by PCP  5.PAD - prior iliac stenting. Seeing Dr. Fletcher Anon in June. Has known renal artery stenosis - BP currently doing well.   Current medicines are reviewed with the patient today.  The patient does not have concerns regarding medicines other than what has been noted above.  The following changes have been made:  See above.  Labs/ tests ordered today include:   No orders of the defined types were placed in this encounter.    Disposition:   FU with Dr. Burt Knack and Fletcher Anon as planned. I am happy to see back as needed.    Patient is agreeable to this plan and will call if any problems develop in the interim.   SignedTruitt Merle, NP  02/02/2019 2:08 PM  [facility] [address] [place], [place]  [address] Phone: [phone] Fax: [phone]

## 2019-03-30 DIAGNOSIS — I701 Atherosclerosis of renal artery: Secondary | ICD-10-CM | POA: Diagnosis not present

## 2019-03-30 DIAGNOSIS — E782 Mixed hyperlipidemia: Secondary | ICD-10-CM | POA: Diagnosis not present

## 2019-03-30 DIAGNOSIS — I119 Hypertensive heart disease without heart failure: Secondary | ICD-10-CM | POA: Diagnosis not present

## 2019-03-30 DIAGNOSIS — I739 Peripheral vascular disease, unspecified: Secondary | ICD-10-CM | POA: Diagnosis not present

## 2019-03-30 DIAGNOSIS — I2510 Atherosclerotic heart disease of native coronary artery without angina pectoris: Secondary | ICD-10-CM | POA: Diagnosis not present

## 2019-03-30 DIAGNOSIS — E039 Hypothyroidism, unspecified: Secondary | ICD-10-CM | POA: Diagnosis not present

## 2019-03-30 DIAGNOSIS — F322 Major depressive disorder, single episode, severe without psychotic features: Secondary | ICD-10-CM | POA: Diagnosis not present

## 2019-03-30 DIAGNOSIS — R7301 Impaired fasting glucose: Secondary | ICD-10-CM | POA: Diagnosis not present

## 2019-04-06 ENCOUNTER — Other Ambulatory Visit (HOSPITAL_COMMUNITY): Payer: Self-pay | Admitting: Cardiovascular Disease

## 2019-04-06 DIAGNOSIS — I6523 Occlusion and stenosis of bilateral carotid arteries: Secondary | ICD-10-CM

## 2019-04-24 ENCOUNTER — Ambulatory Visit (HOSPITAL_COMMUNITY)
Admission: RE | Admit: 2019-04-24 | Discharge: 2019-04-24 | Disposition: A | Discharge status: Home / Self Care | Payer: Medicare HMO | Source: Ambulatory Visit | Attending: Cardiology | Admitting: Cardiology

## 2019-04-24 ENCOUNTER — Other Ambulatory Visit (HOSPITAL_COMMUNITY): Payer: Self-pay | Admitting: Cardiovascular Disease

## 2019-04-24 ENCOUNTER — Other Ambulatory Visit: Payer: Self-pay

## 2019-04-24 DIAGNOSIS — I6523 Occlusion and stenosis of bilateral carotid arteries: Secondary | ICD-10-CM | POA: Diagnosis not present

## 2019-05-05 DIAGNOSIS — S61412A Laceration without foreign body of left hand, initial encounter: Secondary | ICD-10-CM | POA: Diagnosis not present

## 2019-05-05 DIAGNOSIS — S61012A Laceration without foreign body of left thumb without damage to nail, initial encounter: Secondary | ICD-10-CM | POA: Diagnosis not present

## 2019-05-05 DIAGNOSIS — Z79899 Other long term (current) drug therapy: Secondary | ICD-10-CM | POA: Diagnosis not present

## 2019-05-19 ENCOUNTER — Ambulatory Visit: Payer: Medicare HMO | Admitting: Cardiovascular Disease

## 2019-05-25 ENCOUNTER — Telehealth: Payer: Self-pay | Admitting: *Deleted

## 2019-05-25 NOTE — Telephone Encounter (Signed)
Patient has an appointment on 06/02/2019 with Dr. Fletcher Anon. She will be having dopplers on the same day, prior to the appointment. She has been made aware to come alone to the appointment and to wear a mask.      COVID-19 Pre-Screening Questions:  . In the past 7 to 10 days have you had a cough,  shortness of breath, headache, congestion, fever (100 or greater) body aches, chills, sore throat, or sudden loss of taste or sense of smell? No . Have you been around anyone with known Covid 19. No . Have you been around anyone who is awaiting Covid 19 test results in the past 7 to 10 days? No . Have you been around anyone who has been exposed to Covid 19, or has mentioned symptoms of Covid 19 within the past 7 to 10 days? No  If you have any concerns/questions about symptoms patients report during screening (either on the phone or at threshold). Contact the provider seeing the patient or DOD for further guidance.  If neither are available contact a member of the leadership team.

## 2019-06-01 ENCOUNTER — Telehealth: Payer: Self-pay | Admitting: Cardiovascular Disease

## 2019-06-01 NOTE — Telephone Encounter (Signed)
LVM, reminding pt of her 06-02-19 appt with Dr Fletcher Anon.

## 2019-06-02 ENCOUNTER — Other Ambulatory Visit: Payer: Self-pay

## 2019-06-02 ENCOUNTER — Encounter: Payer: Self-pay | Admitting: Cardiovascular Disease

## 2019-06-02 ENCOUNTER — Other Ambulatory Visit (HOSPITAL_COMMUNITY): Payer: Self-pay | Admitting: Cardiovascular Disease

## 2019-06-02 ENCOUNTER — Ambulatory Visit (HOSPITAL_COMMUNITY)
Admission: RE | Admit: 2019-06-02 | Discharge: 2019-06-02 | Disposition: A | Discharge status: Home / Self Care | Payer: Medicare HMO | Source: Ambulatory Visit | Attending: Cardiology | Admitting: Cardiology

## 2019-06-02 ENCOUNTER — Ambulatory Visit (INDEPENDENT_AMBULATORY_CARE_PROVIDER_SITE_OTHER): Payer: Medicare HMO | Admitting: Cardiovascular Disease

## 2019-06-02 ENCOUNTER — Ambulatory Visit (HOSPITAL_BASED_OUTPATIENT_CLINIC_OR_DEPARTMENT_OTHER)
Admission: RE | Admit: 2019-06-02 | Discharge: 2019-06-02 | Disposition: A | Discharge status: Home / Self Care | Payer: Medicare HMO | Source: Ambulatory Visit | Attending: Cardiovascular Disease | Admitting: Cardiovascular Disease

## 2019-06-02 VITALS — BP 130/72 | HR 49 | Ht 62.0 in | Wt 130.0 lb

## 2019-06-02 DIAGNOSIS — I2510 Atherosclerotic heart disease of native coronary artery without angina pectoris: Secondary | ICD-10-CM | POA: Diagnosis not present

## 2019-06-02 DIAGNOSIS — Z95828 Presence of other vascular implants and grafts: Secondary | ICD-10-CM

## 2019-06-02 DIAGNOSIS — E7800 Pure hypercholesterolemia, unspecified: Secondary | ICD-10-CM

## 2019-06-02 DIAGNOSIS — Z960 Presence of urogenital implants: Secondary | ICD-10-CM | POA: Insufficient documentation

## 2019-06-02 DIAGNOSIS — H3530 Unspecified macular degeneration: Secondary | ICD-10-CM | POA: Insufficient documentation

## 2019-06-02 DIAGNOSIS — I739 Peripheral vascular disease, unspecified: Secondary | ICD-10-CM | POA: Diagnosis not present

## 2019-06-02 DIAGNOSIS — I779 Disorder of arteries and arterioles, unspecified: Secondary | ICD-10-CM

## 2019-06-02 DIAGNOSIS — I10 Essential (primary) hypertension: Secondary | ICD-10-CM | POA: Diagnosis not present

## 2019-06-02 NOTE — Progress Notes (Signed)
Cardiology Office Note   Date:  09/23/2018   ID:  Erica [name], DOB [birth year], MRN [id number]  PCP:  Mayra Neer, MD  Cardiologist:  Dr. Burt Knack   Chief Complaint  Patient presents with  . Follow-up    pt denied chest pain      History of Present Illness: Erica [name] is a 78 y.o. female who is here today for follow-up visit regarding peripheral arterial disease.    She has known history of coronary artery disease status post CABG in 2011, mild to moderate carotid disease, hyperlipidemia and essential hypertension.  She quit smoking at the age of 69. Cardiac catheterization in July,2019 showed significant restenosis in the right coronary artery which was treated with cutting balloon angioplasty.  There was significant calcified LAD disease which was treated with staged atherectomy and drug-eluting stent placement in August.  She had worsening claudication in 2019 and underwent angiography in November which showed moderate distal aortic stenosis above the iliac bifurcation not significant by gradient, 60% stenosis of the right common iliac artery and mild common femoral artery disease and no significant infrainguinal disease.  On the left, there was significant left common iliac artery stenosis and mild left common femoral artery stenosis.  I performed successful bilateral common iliac artery kissing stent placement extending into the distal aorta.   She denies any recurrent claudication.  No chest pain or shortness of breath.  She has chronic bradycardia with no syncope or presyncope.  She reports mild dizziness.  Vascular studies today showed normal ABI bilaterally.  Duplex showed patent common iliac artery stents.  Diagnosis Date  . Anxiety   . Chronic mid back pain   . Coronary artery disease    status post multiple percutaneous interventions  . Depression   . History of kidney stones 1990s X 1; 2016   . Hyperlipidemia   . Hypertension   . Hypothyroidism   .  Myocardial infarction ([place]) 01/2010  . Osteoarthritis    "hands, back" (06/04/2018)    Past Surgical History:  Procedure Laterality Date  . CARDIAC CATHETERIZATION  12-2008  . CATARACT EXTRACTION W/ INTRAOCULAR LENS IMPLANT Bilateral   . CORONARY ANGIOPLASTY WITH STENT PLACEMENT  2006    right coronary artery with drug-eluting stent  . CORONARY ANGIOPLASTY WITH STENT PLACEMENT  1998    bare-metal stent to the right coronary artery  . CORONARY ANGIOPLASTY WITH STENT PLACEMENT  2008   drug-eluting stent placed in the left circumflex  . CORONARY ARTERY BYPASS GRAFT  01-25-10   CABG X4  . CORONARY ATHERECTOMY N/A 07/16/2018   Procedure: CORONARY ATHERECTOMY;  Surgeon: [name], MD;  Location: [place] [facility];  Service: Cardiovascular;  Laterality: N/A;  . CORONARY BALLOON ANGIOPLASTY  06/04/2018   cutting balloon  . CORONARY BALLOON ANGIOPLASTY N/A 06/04/2018   Procedure: CORONARY BALLOON ANGIOPLASTY;  Surgeon: [name], MD;  Location: [place] [facility];  Service: Cardiovascular;  Laterality: N/A;  . CYSTOSCOPY WITH RETROGRADE PYELOGRAM, URETEROSCOPY AND STENT PLACEMENT Right 10/18/2015   Procedure: CYSTOSCOPY WITH RETROGRADE PYELOGRAM,  AND STENT PLACEMENT;  Surgeon: Festus Aloe, MD;  Location: [facility];  Service: Urology;  Laterality: Right;  . CYSTOSCOPY/URETEROSCOPY/HOLMIUM LASER/STENT PLACEMENT Right 11/29/2015   Procedure: RIGHT URETEROSCOPY/RIGHT RETROGRADE PYELOGRAM/ RIGHT STENT REPLACEMENT;  Surgeon: Festus Aloe, MD;  Location: [facility];  Service: Urology;  Laterality: Right;  . FOOT SURGERY Bilateral 2008-2016   "bones shortened"  . INTRAVASCULAR PRESSURE WIRE/FFR STUDY N/A 06/04/2018  Procedure: INTRAVASCULAR PRESSURE WIRE/FFR STUDY;  Surgeon: [name], MD;  Location: [place] [facility];  Service: Cardiovascular;  Laterality: N/A;  . LEFT HEART CATH AND CORS/GRAFTS ANGIOGRAPHY N/A 06/04/2018   Procedure: LEFT HEART CATH AND CORS/GRAFTS  ANGIOGRAPHY;  Surgeon: [name], MD;  Location: [place] [facility];  Service: Cardiovascular;  Laterality: N/A;  . ULTRASOUND GUIDANCE FOR VASCULAR ACCESS  06/04/2018   Procedure: Ultrasound Guidance For Vascular Access;  Surgeon: [name], MD;  Location: [place] [facility];  Service: Cardiovascular;;     Current Outpatient Medications  Medication Sig Dispense Refill  . amLODipine (NORVASC) 2.5 MG tablet Take 1 tablet (2.5 mg total) by mouth daily. 90 tablet 3  . aspirin 81 MG tablet Take 1 tablet (81 mg total) by mouth daily. 30 tablet 0  . atenolol (TENORMIN) 25 MG tablet Take 0.5 tablets (12.5 mg total) by mouth daily. 30 tablet 2  . atorvastatin (LIPITOR) 80 MG tablet Take 80 mg by mouth daily.    . clopidogrel (PLAVIX) 75 MG tablet Take 1 tablet (75 mg total) by mouth daily with breakfast. 90 tablet 2  . colesevelam (WELCHOL) 625 MG tablet Take 1,250 mg by mouth 2 (two) times daily with a meal.    . hydrochlorothiazide (HYDRODIURIL) 25 MG tablet Take 25 mg by mouth daily.    Marland Kitchen levothyroxine (SYNTHROID, LEVOTHROID) 100 MCG tablet Take 100 mcg by mouth daily.    . Multiple Vitamins-Minerals (WOMENS MULTIVITAMIN PO) Take 1 tablet by mouth 3 (three) times a week.    . nitroGLYCERIN (NITROSTAT) 0.4 MG SL tablet Place 1 tablet (0.4 mg total) under the tongue every 5 (five) minutes as needed for chest pain. 25 tablet 3  . sertraline (ZOLOFT) 50 MG tablet Take 50 mg by mouth daily.      No current facility-administered medications for this visit.     Allergies:   Morphine and related    Social History:  The patient  reports that she quit smoking about 21 years ago. Her smoking use included cigarettes. She has a 37.00 pack-year smoking history. She has never used smokeless tobacco. She reports that she drank alcohol. She reports that she does not use drugs.   Family History:  The patient's family history includes Other in her unknown relative; Other (age of onset: 60) in her  mother.    ROS:  Please see the history of present illness.   Otherwise, review of systems are positive for none.   All other systems are reviewed and negative.    PHYSICAL EXAM: VS:  BP (!) 143/62   Pulse (!) 49   Ht 5\' 2"  (1.575 m)   Wt 125 lb 12.8 oz (57.1 kg)   BMI 23.01 kg/m  , BMI Body mass index is 23.01 kg/m. GEN: Well nourished, well developed, in no acute distress  HEENT: normal  Neck: no JVD, or masses.  Faint right carotid bruit Cardiac: RRR; no murmurs, rubs, or gallops,no edema  Respiratory:  clear to auscultation bilaterally, normal work of breathing GI: soft, nontender, nondistended, + BS MS: no deformity or atrophy  Skin: warm and dry, no rash Neuro:  Strength and sensation are intact Psych: euthymic mood, full affect Vascular: Femoral pulses +2 bilaterally   EKG:  EKG is ordered today. EKG showed sinus bradycardia with a heart rate of 49 bpm.  Nonspecific ST changes.  No flowsheet data found.    ASSESSMENT AND PLAN:  1.  Peripheral arterial disease: Status post recent successful  bilateral common iliac artery stent placement.  No recurrent claudications.  Discussed Doppler studies from today with her.  Repeat in 6 months.    2.  Coronary artery disease involving native coronary arteries without angina: Continue medical therapy   3.  Renal artery stenosis: Noted on CTA on the right side.  No indication for revascularization given that her blood pressure is controlled with no heart failure or progressive chronic kidney disease.  4.  Essential hypertension: Blood pressure is reasonably controlled on current medications.  She has chronic bradycardia and currently on small dose atenolol.  If there is worsening, atenolol should be stopped.  5.   Hyperlipidemia: Continue high-dose atorvastatin with a target LDL of less than 70.  6.  Carotid artery disease with mild to moderate bilateral disease on most recent Doppler in May 2020 which has been stable.  Repeat  in 1 year.     Disposition:   FU with me in 6 months  Signed,  Kathlyn Sacramento, MD  09/23/2018 10:26 AM    [place]

## 2019-06-02 NOTE — Patient Instructions (Signed)
Medication Instructions:  Your physician recommends that you continue on your current medications as directed. Please refer to the Current Medication list given to you today.  If you need a refill on your cardiac medications before your next appointment, please call your pharmacy.    Testing/Procedures:  In 6 months: Your physician has requested that you have an Aorta/Iliac doppler. During this test, an ultrasound is used to evaluate the aorta and iliac arteries. Allow 30 minutes for this exam. Do not eat after midnight the day before and avoid carbonated beverages.  Your physician has requested that you have an ankle brachial index (ABI). During this test an ultrasound and blood pressure cuff are used to evaluate the arteries that supply the arms and legs with blood. Allow thirty minutes for this exam. There are no restrictions or special instructions.   Follow-Up: At [facility], you and your health needs are our priority.  As part of our continuing mission to provide you with exceptional heart care, we have created designated Provider Care Teams.  These Care Teams include your primary Cardiologist (physician) and Advanced Practice Providers (APPs -  Physician Assistants and Nurse Practitioners) who all work together to provide you with the care you need, when you need it. You will need a follow up appointment in 6 months (after dopplers have been completed).  Please call our office 2 months in advance to schedule this appointment.  You may see Dr. Fletcher Anon or one of the following Advanced Practice Providers on your designated Care Team:   Kerin Ransom, PA-C Roby Lofts, [place] . Sande Rives, PA-C  Any Other Special Instructions Will Be Listed Below (If Applicable). None

## 2019-06-25 DIAGNOSIS — R7301 Impaired fasting glucose: Secondary | ICD-10-CM | POA: Diagnosis not present

## 2019-06-25 DIAGNOSIS — E039 Hypothyroidism, unspecified: Secondary | ICD-10-CM | POA: Diagnosis not present

## 2019-06-25 DIAGNOSIS — I119 Hypertensive heart disease without heart failure: Secondary | ICD-10-CM | POA: Diagnosis not present

## 2019-06-25 DIAGNOSIS — E782 Mixed hyperlipidemia: Secondary | ICD-10-CM | POA: Diagnosis not present

## 2019-07-01 DIAGNOSIS — I119 Hypertensive heart disease without heart failure: Secondary | ICD-10-CM | POA: Diagnosis not present

## 2019-07-01 DIAGNOSIS — F322 Major depressive disorder, single episode, severe without psychotic features: Secondary | ICD-10-CM | POA: Diagnosis not present

## 2019-07-01 DIAGNOSIS — E039 Hypothyroidism, unspecified: Secondary | ICD-10-CM | POA: Diagnosis not present

## 2019-07-01 DIAGNOSIS — R7301 Impaired fasting glucose: Secondary | ICD-10-CM | POA: Diagnosis not present

## 2019-07-01 DIAGNOSIS — I700 Atherosclerosis of aorta: Secondary | ICD-10-CM | POA: Diagnosis not present

## 2019-07-01 DIAGNOSIS — I2510 Atherosclerotic heart disease of native coronary artery without angina pectoris: Secondary | ICD-10-CM | POA: Diagnosis not present

## 2019-07-01 DIAGNOSIS — M8580 Other specified disorders of bone density and structure, unspecified site: Secondary | ICD-10-CM | POA: Diagnosis not present

## 2019-07-01 DIAGNOSIS — H3530 Unspecified macular degeneration: Secondary | ICD-10-CM | POA: Diagnosis not present

## 2019-07-01 DIAGNOSIS — Z0000 Encounter for general adult medical examination without abnormal findings: Secondary | ICD-10-CM | POA: Diagnosis not present

## 2019-07-02 DIAGNOSIS — H6123 Impacted cerumen, bilateral: Secondary | ICD-10-CM | POA: Diagnosis not present

## 2019-08-13 DIAGNOSIS — Z8262 Family history of osteoporosis: Secondary | ICD-10-CM | POA: Diagnosis not present

## 2019-08-13 DIAGNOSIS — M810 Age-related osteoporosis without current pathological fracture: Secondary | ICD-10-CM | POA: Diagnosis not present

## 2019-08-13 DIAGNOSIS — Z1231 Encounter for screening mammogram for malignant neoplasm of breast: Secondary | ICD-10-CM | POA: Diagnosis not present

## 2019-08-13 DIAGNOSIS — Z780 Asymptomatic menopausal state: Secondary | ICD-10-CM | POA: Diagnosis not present

## 2019-09-15 DIAGNOSIS — M810 Age-related osteoporosis without current pathological fracture: Secondary | ICD-10-CM | POA: Diagnosis not present

## 2019-11-02 DIAGNOSIS — E782 Mixed hyperlipidemia: Secondary | ICD-10-CM | POA: Diagnosis not present

## 2019-11-02 DIAGNOSIS — L282 Other prurigo: Secondary | ICD-10-CM | POA: Diagnosis not present

## 2019-11-02 DIAGNOSIS — Z7189 Other specified counseling: Secondary | ICD-10-CM | POA: Diagnosis not present

## 2019-11-02 DIAGNOSIS — R7301 Impaired fasting glucose: Secondary | ICD-10-CM | POA: Diagnosis not present

## 2019-11-02 DIAGNOSIS — R4189 Other symptoms and signs involving cognitive functions and awareness: Secondary | ICD-10-CM | POA: Diagnosis not present

## 2019-11-02 DIAGNOSIS — E039 Hypothyroidism, unspecified: Secondary | ICD-10-CM | POA: Diagnosis not present

## 2019-11-02 DIAGNOSIS — I119 Hypertensive heart disease without heart failure: Secondary | ICD-10-CM | POA: Diagnosis not present

## 2019-11-02 DIAGNOSIS — I2510 Atherosclerotic heart disease of native coronary artery without angina pectoris: Secondary | ICD-10-CM | POA: Diagnosis not present

## 2019-11-11 DIAGNOSIS — E782 Mixed hyperlipidemia: Secondary | ICD-10-CM | POA: Diagnosis not present

## 2019-11-11 DIAGNOSIS — L570 Actinic keratosis: Secondary | ICD-10-CM | POA: Diagnosis not present

## 2019-11-11 DIAGNOSIS — I119 Hypertensive heart disease without heart failure: Secondary | ICD-10-CM | POA: Diagnosis not present

## 2019-11-11 DIAGNOSIS — Z23 Encounter for immunization: Secondary | ICD-10-CM | POA: Diagnosis not present

## 2019-11-11 DIAGNOSIS — R4189 Other symptoms and signs involving cognitive functions and awareness: Secondary | ICD-10-CM | POA: Diagnosis not present

## 2019-11-11 DIAGNOSIS — R7301 Impaired fasting glucose: Secondary | ICD-10-CM | POA: Diagnosis not present

## 2019-11-11 DIAGNOSIS — E039 Hypothyroidism, unspecified: Secondary | ICD-10-CM | POA: Diagnosis not present

## 2019-11-16 ENCOUNTER — Other Ambulatory Visit: Payer: Self-pay

## 2019-11-16 ENCOUNTER — Encounter: Payer: Self-pay | Admitting: Cardiovascular Disease

## 2019-11-16 ENCOUNTER — Ambulatory Visit: Payer: Medicare HMO | Admitting: Cardiovascular Disease

## 2019-11-16 VITALS — BP 122/58 | HR 51 | Ht 62.0 in | Wt 130.0 lb

## 2019-11-16 DIAGNOSIS — E782 Mixed hyperlipidemia: Secondary | ICD-10-CM

## 2019-11-16 DIAGNOSIS — I10 Essential (primary) hypertension: Secondary | ICD-10-CM

## 2019-11-16 DIAGNOSIS — I739 Peripheral vascular disease, unspecified: Secondary | ICD-10-CM

## 2019-11-16 DIAGNOSIS — I25118 Atherosclerotic heart disease of native coronary artery with other forms of angina pectoris: Secondary | ICD-10-CM | POA: Diagnosis not present

## 2019-11-16 MED ORDER — EZETIMIBE 10 MG PO TABS: 10.0000 mg | ORAL_TABLET | Freq: Every day | ORAL | 3 refills | Status: DC

## 2019-11-16 NOTE — Patient Instructions (Signed)
Medication Instructions:  1) START ZETIA 10 mg daily *If you need a refill on your cardiac medications before your next appointment, please call your pharmacy*  Lab Work: Your provider recommends that you return for FASTING lab work in: 3 months   If you have labs (blood work) drawn today and your tests are completely normal, you will receive your results only by: Marland Kitchen MyChart Message (if you have MyChart) OR . A paper copy in the mail If you have any lab test that is abnormal or we need to change your treatment, we will call you to review the results.  Follow-Up: At [facility], you and your health needs are our priority.  As part of our continuing mission to provide you with exceptional heart care, we have created designated Provider Care Teams.  These Care Teams include your primary Cardiologist (physician) and Advanced Practice Providers (APPs -  Physician Assistants and Nurse Practitioners) who all work together to provide you with the care you need, when you need it. Your next appointment:   6 month(s) The format for your next appointment:   In Person Provider:   Truitt Merle, NP

## 2019-11-16 NOTE — Progress Notes (Signed)
Cardiology Office Note:    Date:  11/17/2019   ID:  Erica [name], DOB [birth year], MRN [id number]  PCP:  Mayra Neer, MD  Cardiologist:  [name], MD  Electrophysiologist:  None   Referring MD: Mayra Neer, MD   Chief Complaint  Patient presents with   Coronary Artery Disease    History of Present Illness:    Erica [name] is a 78 y.o. female with a hx of coronary and peripheral arterial disease, presenting for follow-up of evaluation.  She underwent multivessel CABG in 2011.  In 2019 she was treated with balloon angioplasty of severe in-stent restenosis in the RCA.  She then underwent atherectomy and stenting of the proximal LAD in staged fashion.  She developed severely limiting claudication symptoms and was found to have severe bilateral iliac stenosis treated with iliac stenting by Dr. Fletcher Anon.  The patient is here alone today for follow-up evaluation.  She has been doing well.  She has rare episodes of angina, much less than before PCI.  Her legs are doing great.  She is now able to walk without limitation.  She has no chest pain or pressure with any routine activities.  She denies shortness of breath, edema, or heart palpitations.  She is compliant with her medications.  Her bruising has improved since discontinuation of aspirin.  Past Medical History:  Diagnosis Date   Anxiety    Chronic mid back pain    Coronary artery disease    status post multiple percutaneous interventions   Depression    History of kidney stones 1990s X 1; 2016    Hyperlipidemia    Hypertension    Hypothyroidism    Myocardial infarction ([place]) 01/2010   Osteoarthritis    "hands, back" (10/15/2018)    Past Surgical History:  Procedure Laterality Date   ABDOMINAL AORTOGRAM W/LOWER EXTREMITY  10/15/2018   ABDOMINAL AORTOGRAM W/LOWER EXTREMITY N/A 10/15/2018   Procedure: ABDOMINAL AORTOGRAM W/LOWER EXTREMITY;  Surgeon: Wellington Hampshire, MD;  Location: [facility];  Service: Cardiovascular;  Laterality: N/A;   CARDIAC CATHETERIZATION  12-2008   CATARACT EXTRACTION W/ INTRAOCULAR LENS IMPLANT Bilateral    CORONARY ANGIOPLASTY WITH STENT PLACEMENT  2006    right coronary artery with drug-eluting stent   CORONARY ANGIOPLASTY WITH STENT PLACEMENT  1998    bare-metal stent to the right coronary artery   CORONARY ANGIOPLASTY WITH STENT PLACEMENT  2008   drug-eluting stent placed in the left circumflex   CORONARY ARTERY BYPASS GRAFT  01-25-10   CABG X4   CORONARY ATHERECTOMY N/A 07/16/2018   Procedure: CORONARY ATHERECTOMY;  Surgeon: [name], MD;  Location: [facility];  Service: Cardiovascular;  Laterality: N/A;   CORONARY BALLOON ANGIOPLASTY N/A 06/04/2018   Procedure: CORONARY BALLOON ANGIOPLASTY;  Surgeon: [name], MD;  Location: [facility];  Service: Cardiovascular;  Laterality: N/A;   CYSTOSCOPY WITH RETROGRADE PYELOGRAM, URETEROSCOPY AND STENT PLACEMENT Right 10/18/2015   Procedure: CYSTOSCOPY WITH RETROGRADE PYELOGRAM,  AND STENT PLACEMENT;  Surgeon: Festus Aloe, MD;  Location: [facility];  Service: Urology;  Laterality: Right;   CYSTOSCOPY/URETEROSCOPY/HOLMIUM LASER/STENT PLACEMENT Right 11/29/2015   Procedure: RIGHT URETEROSCOPY/RIGHT RETROGRADE PYELOGRAM/ RIGHT STENT REPLACEMENT;  Surgeon: Festus Aloe, MD;  Location: [facility];  Service: Urology;  Laterality: Right;   FOOT SURGERY Bilateral 2008-2016   "bones shortened prox to  great toes"   HAMMER TOE SURGERY Right 2008   2nd and 3rd digits   INTRAVASCULAR PRESSURE WIRE/FFR  STUDY N/A 06/04/2018   Procedure: INTRAVASCULAR PRESSURE WIRE/FFR STUDY;  Surgeon: [name], MD;  Location: [facility];  Service: Cardiovascular;  Laterality: N/A;   LEFT HEART CATH AND CORS/GRAFTS ANGIOGRAPHY N/A 06/04/2018   Procedure: LEFT HEART CATH AND CORS/GRAFTS ANGIOGRAPHY;  Surgeon: [name], MD;  Location: [facility];   Service: Cardiovascular;  Laterality: N/A;   PERIPHERAL VASCULAR INTERVENTION Bilateral 10/15/2018   PERIPHERAL VASCULAR INTERVENTION Bilateral 10/15/2018   Procedure: PERIPHERAL VASCULAR INTERVENTION;  Surgeon: Wellington Hampshire, MD;  Location: [facility];  Service: Cardiovascular;  Laterality: Bilateral;   ULTRASOUND GUIDANCE FOR VASCULAR ACCESS  06/04/2018   Procedure: Ultrasound Guidance For Vascular Access;  Surgeon: [name], MD;  Location: [facility];  Service: Cardiovascular;;    Current Medications: Current Meds  Medication Sig   amLODipine (NORVASC) 5 MG tablet Take 5 mg by mouth daily.   atenolol (TENORMIN) 25 MG tablet Take 0.5 tablets (12.5 mg total) by mouth daily.   Calcium Carb-Cholecalciferol (CALCIUM + VITAMIN D3 PO) Take 1 tablet by mouth daily.    clopidogrel (PLAVIX) 75 MG tablet Take 1 tablet (75 mg total) by mouth daily with breakfast.   levothyroxine (SYNTHROID, LEVOTHROID) 100 MCG tablet Take 100 mcg by mouth daily before breakfast.    Multiple Vitamins-Minerals (WOMENS MULTIVITAMIN PO) Take 1 tablet by mouth daily.    nitroGLYCERIN (NITROSTAT) 0.4 MG SL tablet Place 1 tablet (0.4 mg total) under the tongue every 5 (five) minutes as needed for chest pain.   rosuvastatin (CRESTOR) 40 MG tablet Take 40 mg by mouth daily.   sertraline (ZOLOFT) 50 MG tablet Take 50 mg by mouth daily.    [DISCONTINUED] aspirin 81 MG tablet Take 1 tablet (81 mg total) by mouth daily.     Allergies:   Morphine and related   Social History   Socioeconomic History   Marital status: Widowed    Spouse name: Not on file   Number of children: Not on file   Years of education: Not on file   Highest education level: Not on file  Occupational History   Occupation: Glass blower/designer  Tobacco Use   Smoking status: Former Smoker    Packs/day: 1.00    Years: 37.00    Pack years: 37.00    Types: Cigarettes    Quit date: 11/22/1996    Years since  quitting: 23.0   Smokeless tobacco: Never Used  Substance and Sexual Activity   Alcohol use: Yes    Comment: 10/15/2018 "COUPLE DRINKS/YEAR   Drug use: Never   Sexual activity: Not Currently  Other Topics Concern   Not on file  Social History Narrative   Not on file   Social Determinants of Health   Financial Resource Strain:    Difficulty of Paying Living Expenses: Not on file  Food Insecurity:    Worried About Charity fundraiser in the Last Year: Not on file   [employer] of Food in the Last Year: Not on file  Transportation Needs:    Lack of Transportation (Medical): Not on file   Lack of Transportation (Non-Medical): Not on file  Physical Activity:    Days of Exercise per Week: Not on file   Minutes of Exercise per Session: Not on file  Stress:    Feeling of Stress : Not on file  Social Connections:    Frequency of Communication with Friends and Family: Not on file   Frequency of Social Gatherings with  Friends and Family: Not on file   Attends Religious Services: Not on file   Active Member of Clubs or Organizations: Not on file   Attends Archivist Meetings: Not on file   Marital Status: Not on file     Family History: The patient's family history includes Other in an other family member; Other (age of onset: 46) in her mother.  ROS:   Please see the history of present illness.    All other systems reviewed and are negative.  EKGs/Labs/Other Studies Reviewed:    EKG:  EKG is not ordered today.   Recent Labs: No results found for requested labs within last 8760 hours.  Recent Lipid Panel    Component Value Date/Time   CHOL  01/09/2010 0506    131        ATP III CLASSIFICATION:  <200     mg/dL   Desirable  200-239  mg/dL   Borderline High  >=240    mg/dL   High          TRIG 87 01/09/2010 0506   HDL 48 01/09/2010 0506   CHOLHDL 2.7 01/09/2010 0506   VLDL 17 01/09/2010 0506   LDLCALC  01/09/2010 0506    66        Total  Cholesterol/HDL:CHD Risk Coronary Heart Disease Risk Table                     Men   Women  1/2 Average Risk   3.4   3.3  Average Risk       5.0   4.4  2 X Average Risk   9.6   7.1  3 X Average Risk  23.4   11.0        Use the calculated Patient Ratio above and the CHD Risk Table to determine the patient's CHD Risk.        ATP III CLASSIFICATION (LDL):  <100     mg/dL   Optimal  100-129  mg/dL   Near or Above                    Optimal  130-159  mg/dL   Borderline  160-189  mg/dL   High  >190     mg/dL   Very High    Physical Exam:    VS:  BP (!) 122/58    Pulse (!) 51    Ht 5\' 2"  (1.575 m)    Wt 130 lb (59 kg)    SpO2 99%    BMI 23.78 kg/m     Wt Readings from Last 3 Encounters:  11/16/19 130 lb (59 kg)  06/02/19 130 lb (59 kg)  02/02/19 130 lb 1.9 oz (59 kg)     GEN:  Well nourished, well developed in no acute distress HEENT: Normal NECK: No JVD; No carotid bruits LYMPHATICS: No lymphadenopathy CARDIAC: RRR, no murmurs, rubs, gallops RESPIRATORY:  Clear to auscultation without rales, wheezing or rhonchi  ABDOMEN: Soft, non-tender, non-distended MUSCULOSKELETAL:  No edema; No deformity  SKIN: Warm and dry NEUROLOGIC:  Alert and oriented x 3 PSYCHIATRIC:  Normal affect   ASSESSMENT:    1. Coronary artery disease of native artery of native heart with stable angina pectoris ([place])   2. PAD (peripheral artery disease) ([place])   3. Essential hypertension   4. Mixed hyperlipidemia    PLAN:    In order of problems listed above:  1. She continues on antiplatelet therapy  with clopidogrel.  She is treated with high intensity statin drug, beta-blocker, and a calcium channel blocker.  Could add isosorbide if symptoms worsen, but now she is satisfied with symptom control and seems to be doing quite well. 2. Claudication symptoms have resolved since undergoing bilateral iliac stenting.  She remains on antiplatelet therapy with clopidogrel and aggressive risk reduction  measures. 3. Blood pressure is well controlled on atenolol and amlodipine. 4. LDL cholesterol is above goal.  She is on maximum dose of rosuvastatin.  I recommended that she add Zetia 10 mg daily and that we repeat lipids and LFTs in 3 months.   Medication Adjustments/Labs and Tests Ordered: Current medicines are reviewed at length with the patient today.  Concerns regarding medicines are outlined above.  Orders Placed This Encounter  Procedures   LFTs   Lipid panel   Meds ordered this encounter  Medications   ezetimibe (ZETIA) 10 MG tablet    Sig: Take 1 tablet (10 mg total) by mouth daily.    Dispense:  90 tablet    Refill:  3    Patient Instructions  Medication Instructions:  1) START ZETIA 10 mg daily *If you need a refill on your cardiac medications before your next appointment, please call your pharmacy*  Lab Work: Your provider recommends that you return for FASTING lab work in: 3 months   If you have labs (blood work) drawn today and your tests are completely normal, you will receive your results only by:  [place] (if you have MyChart) OR  A paper copy in the mail If you have any lab test that is abnormal or we need to change your treatment, we will call you to review the results.  Follow-Up: At [facility], you and your health needs are our priority.  As part of our continuing mission to provide you with exceptional heart care, we have created designated Provider Care Teams.  These Care Teams include your primary Cardiologist (physician) and Advanced Practice Providers (APPs -  Physician Assistants and Nurse Practitioners) who all work together to provide you with the care you need, when you need it. Your next appointment:   6 month(s) The format for your next appointment:   In Person Provider:   Truitt Merle, NP    Signed, [name], MD  11/17/2019 8:52 AM    [place]

## 2019-11-17 ENCOUNTER — Encounter: Payer: Self-pay | Admitting: Cardiovascular Disease

## 2020-01-15 ENCOUNTER — Ambulatory Visit: Payer: Medicare HMO | Attending: Internal Medicine

## 2020-01-15 DIAGNOSIS — Z23 Encounter for immunization: Secondary | ICD-10-CM

## 2020-01-15 NOTE — Progress Notes (Signed)
   Covid-19 Vaccination Clinic  Name:  Erica [name]    MRN: [id number] DOB: 06/02/1941  01/15/2020  Erica [name] was observed post Covid-19 immunization for 15 minutes without incidence. She was provided with Vaccine Information Sheet and instruction to access the V-Safe system.   Erica [name] was instructed to call 911 with any severe reactions post vaccine: Marland Kitchen Difficulty breathing  . Swelling of your face and throat  . A fast heartbeat  . A bad rash all over your body  . Dizziness and weakness    Immunizations Administered    Name Date Dose VIS Date Route   Pfizer COVID-19 Vaccine 01/15/2020  5:24 PM 0.3 mL 11/13/2019 Intramuscular   Manufacturer: [place]   Lot: [id number]   [place]: [id number]

## 2020-02-07 ENCOUNTER — Ambulatory Visit: Payer: Medicare HMO | Attending: Internal Medicine

## 2020-02-07 DIAGNOSIS — Z23 Encounter for immunization: Secondary | ICD-10-CM | POA: Insufficient documentation

## 2020-02-07 NOTE — Progress Notes (Signed)
   Covid-19 Vaccination Clinic  Name:  Erica [name]    MRN: [id number] DOB: 03-05-1941  02/07/2020  Erica [name] was observed post Covid-19 immunization for 15 minutes without incident. She was provided with Vaccine Information Sheet and instruction to access the V-Safe system.   Erica [name] was instructed to call 911 with any severe reactions post vaccine: Marland Kitchen Difficulty breathing  . Swelling of face and throat  . A fast heartbeat  . A bad rash all over body  . Dizziness and weakness   Immunizations Administered    Name Date Dose VIS Date Route   Pfizer COVID-19 Vaccine 02/07/2020 11:28 AM 0.3 mL 11/13/2019 Intramuscular   Manufacturer: [place]   Lot: [id number]   [place]: [id number]

## 2020-02-15 ENCOUNTER — Other Ambulatory Visit: Payer: Medicare HMO | Admitting: *Deleted

## 2020-02-15 ENCOUNTER — Other Ambulatory Visit: Payer: Self-pay

## 2020-02-15 DIAGNOSIS — I25118 Atherosclerotic heart disease of native coronary artery with other forms of angina pectoris: Secondary | ICD-10-CM | POA: Diagnosis not present

## 2020-02-15 LAB — HEPATIC FUNCTION PANEL
ALT: 21 IU/L (ref 0–32)
AST: 24 IU/L (ref 0–40)
Albumin: 4.7 g/dL (ref 3.7–4.7)
Alkaline Phosphatase: 68 IU/L (ref 39–117)
Bilirubin Total: 0.5 mg/dL (ref 0.0–1.2)
Bilirubin, Direct: 0.15 mg/dL (ref 0.00–0.40)
Total Protein: 6.4 g/dL (ref 6.0–8.5)

## 2020-02-15 LAB — LIPID PANEL
Chol/HDL Ratio: 2.1 ratio (ref 0.0–4.4)
Cholesterol, Total: 148 mg/dL (ref 100–199)
HDL: 69 mg/dL (ref >39)
LDL Chol Calc (NIH): 60 mg/dL (ref 0–99)
Triglycerides: 103 mg/dL (ref 0–149)
VLDL Cholesterol Cal: 19 mg/dL (ref 5–40)

## 2020-03-15 DIAGNOSIS — M810 Age-related osteoporosis without current pathological fracture: Secondary | ICD-10-CM | POA: Diagnosis not present

## 2020-05-04 NOTE — Progress Notes (Signed)
CARDIOLOGY OFFICE NOTE  Date:  05/16/2020    Erica [name] Date of Birth: January 19, 1941 Medical Record [id number]  PCP:  Mayra Neer, MD  Cardiologist:  Jerel Shepherd    Chief Complaint  Patient presents with  . Follow-up    Seen for Dr. Burt Knack    History of Present Illness: Erica [name] is a 79 y.o. female who presents today for a 6 month check. Seen for Dr. Burt Knack.   She has ahistory of CAD with remoteCABG in 2011 with grafting of the LIMA to LAD, saphenous vein graft to PDA and PLA, and saphenous vein graft to OM- per Dr. Cyndia Bent.Her other issues include carotid disease, HLD and PAD - followed by Dr. Fletcher Anon.   She underwent cardiac catheterization in 2019 because of progressive angina. She underwent balloon angioplasty of the right coronary artery for treatment of severe in-stent restenosis. She then underwent atherectomy and stenting of the proximal LAD in a staged fashion. She underwent bilateral iliac stenting by Dr. Fletcher Anon on October 15, 2018.  Last seen by Dr. Burt Knack in December - she was doing well. Bruising had improved off aspirin therapy - she had completed 6 months of therapy post PCI with Dr. Fletcher Anon. Zetia was added for her lipids. There was discussion about adding Imdur but she was happy with her current control of her symptoms.    The patient does not have symptoms concerning for COVID-19 infection (fever, chills, cough, or new shortness of breath).   Comes in today. Here alone. She is doing well. Considering selling her place here in [place] - the upkeep is challenging. She is feeling good. No chest pain. Breathing is good. Labs from March [facility] is noted - LDL is at goal. Her legs are not bothering her. She sees Dr. Fletcher Anon later this month with her studies. She has noted a few spells of fleeing lightheadedness - BP was low - not consistent however. Overall, she feels like she is doing well.   Past Medical History:  Diagnosis Date  .  Anxiety   . Chronic mid back pain   . Coronary artery disease    status post multiple percutaneous interventions  . Depression   . History of kidney stones 1990s X 1; 2016   . Hyperlipidemia   . Hypertension   . Hypothyroidism   . Myocardial infarction ([place]) 01/2010  . Osteoarthritis    "hands, back" (10/15/2018)    Past Surgical History:  Procedure Laterality Date  . ABDOMINAL AORTOGRAM W/LOWER EXTREMITY  10/15/2018  . ABDOMINAL AORTOGRAM W/LOWER EXTREMITY N/A 10/15/2018   Procedure: ABDOMINAL AORTOGRAM W/LOWER EXTREMITY;  Surgeon: Wellington Hampshire, MD;  Location: [place] [facility];  Service: Cardiovascular;  Laterality: N/A;  . CARDIAC CATHETERIZATION  12-2008  . CATARACT EXTRACTION W/ INTRAOCULAR LENS IMPLANT Bilateral   . CORONARY ANGIOPLASTY WITH STENT PLACEMENT  2006    right coronary artery with drug-eluting stent  . CORONARY ANGIOPLASTY WITH STENT PLACEMENT  1998    bare-metal stent to the right coronary artery  . CORONARY ANGIOPLASTY WITH STENT PLACEMENT  2008   drug-eluting stent placed in the left circumflex  . CORONARY ARTERY BYPASS GRAFT  01-25-10   CABG X4  . CORONARY ATHERECTOMY N/A 07/16/2018   Procedure: CORONARY ATHERECTOMY;  Surgeon: [name], MD;  Location: [place] [facility];  Service: Cardiovascular;  Laterality: N/A;  . CORONARY BALLOON ANGIOPLASTY N/A 06/04/2018   Procedure: CORONARY BALLOON ANGIOPLASTY;  Surgeon: [name], MD;  Location: [place] [facility];  Service: Cardiovascular;  Laterality: N/A;  . CYSTOSCOPY WITH RETROGRADE PYELOGRAM, URETEROSCOPY AND STENT PLACEMENT Right 10/18/2015   Procedure: CYSTOSCOPY WITH RETROGRADE PYELOGRAM,  AND STENT PLACEMENT;  Surgeon: Festus Aloe, MD;  Location: [facility];  Service: Urology;  Laterality: Right;  . CYSTOSCOPY/URETEROSCOPY/HOLMIUM LASER/STENT PLACEMENT Right 11/29/2015   Procedure: RIGHT URETEROSCOPY/RIGHT RETROGRADE PYELOGRAM/ RIGHT STENT REPLACEMENT;  Surgeon: Festus Aloe, MD;   Location: [facility];  Service: Urology;  Laterality: Right;  . FOOT SURGERY Bilateral 2008-2016   "bones shortened prox to  great toes"  . HAMMER TOE SURGERY Right 2008   2nd and 3rd digits  . INTRAVASCULAR PRESSURE WIRE/FFR STUDY N/A 06/04/2018   Procedure: INTRAVASCULAR PRESSURE WIRE/FFR STUDY;  Surgeon: [name], MD;  Location: [place] [facility];  Service: Cardiovascular;  Laterality: N/A;  . LEFT HEART CATH AND CORS/GRAFTS ANGIOGRAPHY N/A 06/04/2018   Procedure: LEFT HEART CATH AND CORS/GRAFTS ANGIOGRAPHY;  Surgeon: [name], MD;  Location: [place] [facility];  Service: Cardiovascular;  Laterality: N/A;  . PERIPHERAL VASCULAR INTERVENTION Bilateral 10/15/2018  . PERIPHERAL VASCULAR INTERVENTION Bilateral 10/15/2018   Procedure: PERIPHERAL VASCULAR INTERVENTION;  Surgeon: Wellington Hampshire, MD;  Location: [place] [facility];  Service: Cardiovascular;  Laterality: Bilateral;  . ULTRASOUND GUIDANCE FOR VASCULAR ACCESS  06/04/2018   Procedure: Ultrasound Guidance For Vascular Access;  Surgeon: [name], MD;  Location: [place] [facility];  Service: Cardiovascular;;     Medications: Current Meds  Medication Sig  . amLODipine (NORVASC) 5 MG tablet Take 5 mg by mouth daily.  Marland Kitchen atenolol (TENORMIN) 25 MG tablet Take 0.5 tablets (12.5 mg total) by mouth daily.  . Calcium Carb-Cholecalciferol (CALCIUM + VITAMIN D3 PO) Take 1 tablet by mouth daily.   . clopidogrel (PLAVIX) 75 MG tablet Take 1 tablet (75 mg total) by mouth daily with breakfast.  . ezetimibe (ZETIA) 10 MG tablet Take 1 tablet (10 mg total) by mouth daily.  Marland Kitchen levothyroxine (SYNTHROID, LEVOTHROID) 100 MCG tablet Take 100 mcg by mouth daily before breakfast.   . memantine (NAMENDA) 10 MG tablet   . Multiple Vitamins-Minerals (WOMENS MULTIVITAMIN PO) Take 1 tablet by mouth daily.   . nitroGLYCERIN (NITROSTAT) 0.4 MG SL tablet Place 1 tablet (0.4 mg total) under the tongue every 5 (five) minutes as needed  for chest pain.  . rosuvastatin (CRESTOR) 40 MG tablet Take 40 mg by mouth daily.  . sertraline (ZOLOFT) 50 MG tablet Take 50 mg by mouth daily.      Allergies: Allergies  Allergen Reactions  . Morphine And Related Nausea And Vomiting    Social History: The patient  reports that she quit smoking about 23 years ago. Her smoking use included cigarettes. She has a 37.00 pack-year smoking history. She has never used smokeless tobacco. She reports current alcohol use. She reports that she does not use drugs.   Family History: The patient's family history includes Other in an other family member; Other (age of onset: 63) in her mother.   Review of Systems: Please see the history of present illness.   All other systems are reviewed and negative.   Physical Exam: VS:  BP 130/72   Pulse (!) 56   Ht 5\' 2"  (1.575 m)   Wt 132 lb 12.8 oz (60.2 kg)   SpO2 98%   BMI 24.29 kg/m  .  BMI Body mass index is 24.29 kg/m.  Wt Readings from Last 3 Encounters:  05/16/20 132 lb 12.8  oz (60.2 kg)  11/16/19 130 lb (59 kg)  06/02/19 130 lb (59 kg)    General: Pleasant. Alert and in no acute distress.   Neck: Supple, no JVD, carotid bruits, or masses noted.  Cardiac: Regular rate and rhythm. No murmurs, rubs, or gallops. No edema.  Respiratory:  Lungs are clear to auscultation bilaterally with normal work of breathing.  GI: Soft and nontender.  MS: No deformity or atrophy. Gait and ROM intact.  Skin: Warm and dry. Color is normal.  Neuro:  Strength and sensation are intact and no gross focal deficits noted.  Psych: Alert, appropriate and with normal affect.   LABORATORY DATA:  EKG:  EKG is not ordered today.   Lab Results  Component Value Date   WBC 6.3 09/23/2018   HGB 13.8 09/23/2018   HCT 40.7 09/23/2018   PLT 261 09/23/2018   GLUCOSE 108 (H) 10/16/2018   CHOL 148 02/15/2020   TRIG 103 02/15/2020   HDL 69 02/15/2020   LDLCALC 60 02/15/2020   ALT 21 02/15/2020   AST 24 02/15/2020    NA 138 10/16/2018   K 3.7 10/16/2018   CL 105 10/16/2018   CREATININE 0.83 10/16/2018   BUN 13 10/16/2018   CO2 26 10/16/2018   TSH 1.488 Test methodology is 3rd generation TSH 01/08/2010   INR 1.33 10/18/2015   HGBA1C (H) 01/23/2010    6.2 (NOTE) The ADA recommends the following therapeutic goal for glycemic control related to Hgb A1c measurement: Goal of therapy: <6.5 Hgb A1c  Reference: American Diabetes Association: Clinical Practice Recommendations 2010, Diabetes Care, 2010, 33: (Suppl  1).       BNP (last 3 results) No results for input(s): BNP in the last 8760 hours.  ProBNP (last 3 results) No results for input(s): PROBNP in the last 8760 hours.   Other Studies Reviewed Today:  ABDOMINAL AORTOGRAM W/LOWER EXTREMITY 10/2018  PERIPHERAL VASCULAR INTERVENTION  Conclusion   1. Moderate distal aortic stenosis above the iliac bifurcation not significant by gradient. 2. Right lower extremity: Borderline significant right common iliac artery stenosis with mild common femoral artery disease and no significant SFA popliteal disease and three-vessel runoff below the knee. 3. Left lower extremity: Significant left common iliac artery stenosis with mild common femoral artery disease, no significant SFA and popliteal disease with three-vessel runoff below the knee. 4. Successful bilateral common iliac artery kissing stent placement extending into the distal aorta.  Recommendations: The patient is already on dual antiplatelet therapy. Continue aggressive treatment of risk factors. Given bilateral groin access and small hematoma on the left side, will observe the patient overnight.    CORONARY ATHERECTOMY8/2019  Conclusion   Successful PCI of the LAD using orbital atherectomy and stenting with a 2.75x20 mm Synergy DES  Recommend uninterrupted dual antiplatelet therapy with Aspirin 81mg  daily and Clopidogrel 75mg  dailyfor a minimum of 12 months with proximal LAD  stenting and recent RCA angioplasty of severe in-stent restenosis.   Cath: 06/04/18  Conclusion     Ost Cx to Prox Cx lesion is 100% stenosed.  SVG and is normal in caliber.  LIMA.  Ost LAD to Prox LAD lesion is 75% stenosed.  Mid RCA lesion is 75% stenosed.  Scoring balloon angioplasty was performed using a BALLOON WOLVERINE 2.50X10.  Post intervention, there is a 10% residual stenosis.  Ost RCA to Prox RCA lesion is 40% stenosed.  Seq SVG-.  Origin lesion before RPDA is 100% stenosed.  The left ventricular systolic function is  normal.  LV end diastolic pressure is normal.  1. Severe multivessel CAD with total occlusion of the LCx, severe in-stent restenosis in the RCA, and moderately severe proximal LAD stenosis 2. 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 Mild segmental LV contraction abnormality with preserved LVEF of 55% 4. FFR of the proximal LAD = 0.77 5. FFR of the mid-RCA = 0.75, treated successfully with cutting balloon angioplasty  Recommend: Staged PCI of the LAD - will require orbital atherectomy and stenting Should be OK for DC home tomorrow am and will schedule for outpatient PCI of the LAD with atherectomy  Recommend uninterrupted dual antiplatelet therapy with Aspirin 81mg  daily and Clopidogrel 75mg  daily for a minimum of 6 months (stable ischemic heart disease - Class I recommendation).   EchoStudy Conclusions2016  - Left ventricle: The cavity size was normal. Wall thickness was increased in a pattern of mild LVH. Systolic function was vigorous. The estimated ejection fraction was in the range of 60% to 65%. Wall motion was normal; there were no regional wall motion abnormalities. Features are consistent with a pseudonormal left ventricular filling pattern, with concomitant abnormal relaxation and increased filling pressure (grade 2  diastolic dysfunction). - Mitral valve: There was mild regurgitation. - Left atrium: The atrium was moderately to severely dilated. - Right ventricle: The cavity size was normal. Wall thickness was normal. Systolic function was mildly reduced. - Right atrium: The atrium was mildly dilated. Central venous pressure (est): 8 mm Hg. - Atrial septum: No defect or patent foramen ovale was identified. - Tricuspid valve: There was mild-moderate regurgitation. - Pulmonary arteries: Systolic pressure was increased. PA peak pressure: 49 mm Hg (S). - Inferior vena cava: The vessel was dilated. The respirophasic diameter changes were in the normal range (>= 50%).    ASSESSMENT & PLAN:    1. CAD - remote CABG in 2011 - s/p PCI to the RCA and orbital arthrectomy with DES to LAD from 2019 - otherwise managed medically. She is doing well. No changes made today. CV risk factor modification encouraged. Will continue with statin/beta blocker/calcium channel blocker/Plavix.   2. Resting bradycardia - HR is ok - no syncope noted.   3. HLD - on statin and Zetia - her lipids from March are noted - LDL is at goal - would continue both agents.   4. HTN - BP looks good - fleeting low readings - she is to monitor - she does split her medicines up and does not take all at the same time.   5. PAD - has had prior iliac stenting - sees Dr. 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 BP overall is ok.   Current medicines are reviewed with the patient today.  The patient does not have concerns regarding medicines other than what has been noted above.  The following changes have been made:  See above.  Labs/ tests ordered today include:   No orders of the defined types were placed in this encounter.    Disposition:   FU with Dr. Burt Knack in 6 months. Overall, she is felt to be doing well. Have asked her to keep an eye on her BP for [place].   Patient is agreeable to this plan and  will call if any problems develop in the interim.   SignedTruitt Merle, NP  05/16/2020 1:55 PM  [address]  [place], [place]  [address] Phone: [phone] Fax: [phone]

## 2020-05-16 ENCOUNTER — Encounter: Payer: Self-pay | Admitting: Nurse Practitioner

## 2020-05-16 ENCOUNTER — Other Ambulatory Visit: Payer: Self-pay

## 2020-05-16 ENCOUNTER — Ambulatory Visit: Payer: Medicare HMO | Admitting: Nurse Practitioner

## 2020-05-16 VITALS — BP 130/72 | HR 56 | Ht 62.0 in | Wt 132.8 lb

## 2020-05-16 DIAGNOSIS — E782 Mixed hyperlipidemia: Secondary | ICD-10-CM

## 2020-05-16 DIAGNOSIS — I25118 Atherosclerotic heart disease of native coronary artery with other forms of angina pectoris: Secondary | ICD-10-CM

## 2020-05-16 DIAGNOSIS — I739 Peripheral vascular disease, unspecified: Secondary | ICD-10-CM | POA: Diagnosis not present

## 2020-05-16 DIAGNOSIS — I10 Essential (primary) hypertension: Secondary | ICD-10-CM | POA: Diagnosis not present

## 2020-05-16 NOTE — Patient Instructions (Addendum)
After Visit Summary:  We will be checking the following labs today - NONE   Medication Instructions:    Continue with your current medicines.    If you need a refill on your cardiac medications before your next appointment, please call your pharmacy.     Testing/Procedures To Be Arranged:  N/A  Follow-Up:   See Dr. Burt Knack in 6 months - You will receive a reminder letter in the mail two months in advance. If you don't receive a letter, please call our office to schedule the follow-up appointment.    At [facility], you and your health needs are our priority.  As part of our continuing mission to provide you with exceptional heart care, we have created designated Provider Care Teams.  These Care Teams include your primary Cardiologist (physician) and Advanced Practice Providers (APPs -  Physician Assistants and Nurse Practitioners) who all work together to provide you with the care you need, when you need it.  Special Instructions:  . Stay safe, stay home, wash your hands for at least 20 seconds and wear a mask when out in public.  . It was good to talk with you today.    Call the [place] office at [phone] if you have any questions, problems or concerns.

## 2020-06-01 ENCOUNTER — Ambulatory Visit (HOSPITAL_COMMUNITY)
Admission: RE | Admit: 2020-06-01 | Discharge: 2020-06-01 | Disposition: A | Discharge status: Home / Self Care | Payer: Medicare HMO | Source: Ambulatory Visit | Attending: Cardiovascular Disease | Admitting: Cardiovascular Disease

## 2020-06-01 ENCOUNTER — Ambulatory Visit (HOSPITAL_BASED_OUTPATIENT_CLINIC_OR_DEPARTMENT_OTHER)
Admission: RE | Admit: 2020-06-01 | Discharge: 2020-06-01 | Disposition: A | Discharge status: Home / Self Care | Payer: Medicare HMO | Source: Ambulatory Visit | Attending: Cardiovascular Disease | Admitting: Cardiovascular Disease

## 2020-06-01 ENCOUNTER — Other Ambulatory Visit (HOSPITAL_COMMUNITY): Payer: Self-pay | Admitting: Cardiovascular Disease

## 2020-06-01 ENCOUNTER — Other Ambulatory Visit: Payer: Self-pay

## 2020-06-01 DIAGNOSIS — I6523 Occlusion and stenosis of bilateral carotid arteries: Secondary | ICD-10-CM

## 2020-06-01 DIAGNOSIS — Z95828 Presence of other vascular implants and grafts: Secondary | ICD-10-CM

## 2020-08-01 ENCOUNTER — Other Ambulatory Visit: Payer: Self-pay | Admitting: Cardiovascular Disease

## 2020-08-18 DIAGNOSIS — Z1231 Encounter for screening mammogram for malignant neoplasm of breast: Secondary | ICD-10-CM | POA: Diagnosis not present

## 2020-08-31 DIAGNOSIS — H26492 Other secondary cataract, left eye: Secondary | ICD-10-CM | POA: Diagnosis not present

## 2020-08-31 DIAGNOSIS — D3132 Benign neoplasm of left choroid: Secondary | ICD-10-CM | POA: Diagnosis not present

## 2020-08-31 DIAGNOSIS — H43812 Vitreous degeneration, left eye: Secondary | ICD-10-CM | POA: Diagnosis not present

## 2020-09-08 DIAGNOSIS — Z0000 Encounter for general adult medical examination without abnormal findings: Secondary | ICD-10-CM | POA: Diagnosis not present

## 2020-09-08 DIAGNOSIS — I739 Peripheral vascular disease, unspecified: Secondary | ICD-10-CM | POA: Diagnosis not present

## 2020-09-08 DIAGNOSIS — H3530 Unspecified macular degeneration: Secondary | ICD-10-CM | POA: Diagnosis not present

## 2020-09-08 DIAGNOSIS — R7301 Impaired fasting glucose: Secondary | ICD-10-CM | POA: Diagnosis not present

## 2020-09-08 DIAGNOSIS — I119 Hypertensive heart disease without heart failure: Secondary | ICD-10-CM | POA: Diagnosis not present

## 2020-09-08 DIAGNOSIS — M810 Age-related osteoporosis without current pathological fracture: Secondary | ICD-10-CM | POA: Diagnosis not present

## 2020-09-08 DIAGNOSIS — E039 Hypothyroidism, unspecified: Secondary | ICD-10-CM | POA: Diagnosis not present

## 2020-09-08 DIAGNOSIS — E782 Mixed hyperlipidemia: Secondary | ICD-10-CM | POA: Diagnosis not present

## 2020-09-08 DIAGNOSIS — I2510 Atherosclerotic heart disease of native coronary artery without angina pectoris: Secondary | ICD-10-CM | POA: Diagnosis not present

## 2020-09-08 DIAGNOSIS — Z23 Encounter for immunization: Secondary | ICD-10-CM | POA: Diagnosis not present

## 2020-09-08 DIAGNOSIS — R413 Other amnesia: Secondary | ICD-10-CM | POA: Diagnosis not present

## 2020-09-08 DIAGNOSIS — F322 Major depressive disorder, single episode, severe without psychotic features: Secondary | ICD-10-CM | POA: Diagnosis not present

## 2020-09-20 DIAGNOSIS — M810 Age-related osteoporosis without current pathological fracture: Secondary | ICD-10-CM | POA: Diagnosis not present

## 2020-09-30 DIAGNOSIS — M21961 Unspecified acquired deformity of right lower leg: Secondary | ICD-10-CM | POA: Diagnosis not present

## 2020-11-21 DIAGNOSIS — Z20822 Contact with and (suspected) exposure to covid-19: Secondary | ICD-10-CM | POA: Diagnosis not present

## 2020-12-22 ENCOUNTER — Other Ambulatory Visit: Payer: Self-pay

## 2020-12-22 ENCOUNTER — Ambulatory Visit: Payer: Medicare HMO | Admitting: Cardiovascular Disease

## 2020-12-22 ENCOUNTER — Encounter: Payer: Self-pay | Admitting: Cardiovascular Disease

## 2020-12-22 VITALS — BP 120/60 | HR 54 | Ht 62.0 in | Wt 131.2 lb

## 2020-12-22 DIAGNOSIS — I25119 Atherosclerotic heart disease of native coronary artery with unspecified angina pectoris: Secondary | ICD-10-CM

## 2020-12-22 DIAGNOSIS — I739 Peripheral vascular disease, unspecified: Secondary | ICD-10-CM

## 2020-12-22 DIAGNOSIS — I10 Essential (primary) hypertension: Secondary | ICD-10-CM | POA: Diagnosis not present

## 2020-12-22 DIAGNOSIS — E782 Mixed hyperlipidemia: Secondary | ICD-10-CM | POA: Diagnosis not present

## 2020-12-22 NOTE — Progress Notes (Signed)
Cardiology Office Note:    Date:  12/22/2020   ID:  Erica [name], DOB December 10, 1940, MRN [id number]  PCP:  Erica Neer, MD  [facility] Cardiologist:  Erica Mocha, MD  [place] Electrophysiologist:  None   Referring MD: Erica Neer, MD   Chief Complaint  Patient presents with  . Coronary Artery Disease    History of Present Illness:    Erica [name] is a 80 y.o. female with a hx of coronary artery disease, presenting for follow-up evaluation today.  The patient underwent multivessel CABG in 2011.  Comorbid conditions include carotid stenosis, mixed hyperlipidemia, and peripheral arterial disease.  In 2019 the patient underwent balloon angioplasty of the right coronary artery for treatment of severe in-stent restenosis.  She then underwent atherectomy and stenting of the proximal LAD in staged fashion.  She was treated with bilateral iliac stenting later that year for treatment of intermittent claudication.  She was last seen by Erica [name] in June 2021 at which time she was doing well from a cardiovascular perspective.  The patient is here alone today.  She has been getting along well.  States that she "feels her age."  She has problems with arthritis in her hands and her low back.  She has not had any recent chest pain or pressure with activity.  She has occasional chest discomfort at rest but is not sure whether this is related to gastroesophageal reflux and she never has it with physical activity.  She continues to do well with respect to her legs ever since she underwent iliac stenting.  She has had no further problems with claudication symptoms.  The patient denies orthopnea, PND, or heart palpitations.  Past Medical History:  Diagnosis Date  . Anxiety   . Chronic mid back pain   . Coronary artery disease    status post multiple percutaneous interventions  . Depression   . History of kidney stones 1990s X 1; 2016   . Hyperlipidemia   . Hypertension   .  Hypothyroidism   . Myocardial infarction ([place]) 01/2010  . Osteoarthritis    "hands, back" (10/15/2018)    Past Surgical History:  Procedure Laterality Date  . ABDOMINAL AORTOGRAM W/LOWER EXTREMITY  10/15/2018  . ABDOMINAL AORTOGRAM W/LOWER EXTREMITY N/A 10/15/2018   Procedure: ABDOMINAL AORTOGRAM W/LOWER EXTREMITY;  Surgeon: Wellington Hampshire, MD;  Location: [place] [facility];  Service: Cardiovascular;  Laterality: N/A;  . CARDIAC CATHETERIZATION  12-2008  . CATARACT EXTRACTION W/ INTRAOCULAR LENS IMPLANT Bilateral   . CORONARY ANGIOPLASTY WITH STENT PLACEMENT  2006    right coronary artery with drug-eluting stent  . CORONARY ANGIOPLASTY WITH STENT PLACEMENT  1998    bare-metal stent to the right coronary artery  . CORONARY ANGIOPLASTY WITH STENT PLACEMENT  2008   drug-eluting stent placed in the left circumflex  . CORONARY ARTERY BYPASS GRAFT  01-25-10   CABG X4  . CORONARY ATHERECTOMY N/A 07/16/2018   Procedure: CORONARY ATHERECTOMY;  Surgeon: Erica Mocha, MD;  Location: [place] [facility];  Service: Cardiovascular;  Laterality: N/A;  . CORONARY BALLOON ANGIOPLASTY N/A 06/04/2018   Procedure: CORONARY BALLOON ANGIOPLASTY;  Surgeon: Erica Mocha, MD;  Location: [place] [facility];  Service: Cardiovascular;  Laterality: N/A;  . CYSTOSCOPY WITH RETROGRADE PYELOGRAM, URETEROSCOPY AND STENT PLACEMENT Right 10/18/2015   Procedure: CYSTOSCOPY WITH RETROGRADE PYELOGRAM,  AND STENT PLACEMENT;  Surgeon: Festus Aloe, MD;  [facility];  Service: Urology;  Laterality: Right;  . CYSTOSCOPY/URETEROSCOPY/HOLMIUM LASER/STENT PLACEMENT  Right 11/29/2015   Procedure: RIGHT URETEROSCOPY/RIGHT RETROGRADE PYELOGRAM/ RIGHT STENT REPLACEMENT;  Surgeon: Festus Aloe, MD;  Location: [facility];  Service: Urology;  Laterality: Right;  . FOOT SURGERY Bilateral 2008-2016   "bones shortened prox to  great toes"  . HAMMER TOE SURGERY Right 2008   2nd and 3rd digits  .  INTRAVASCULAR PRESSURE WIRE/FFR STUDY N/A 06/04/2018   Procedure: INTRAVASCULAR PRESSURE WIRE/FFR STUDY;  Surgeon: Erica Mocha, MD;  Location: [place] [facility];  Service: Cardiovascular;  Laterality: N/A;  . LEFT HEART CATH AND CORS/GRAFTS ANGIOGRAPHY N/A 06/04/2018   Procedure: LEFT HEART CATH AND CORS/GRAFTS ANGIOGRAPHY;  Surgeon: Erica Mocha, MD;  Location: [place] [facility];  Service: Cardiovascular;  Laterality: N/A;  . PERIPHERAL VASCULAR INTERVENTION Bilateral 10/15/2018  . PERIPHERAL VASCULAR INTERVENTION Bilateral 10/15/2018   Procedure: PERIPHERAL VASCULAR INTERVENTION;  Surgeon: Wellington Hampshire, MD;  Location: [place] [facility];  Service: Cardiovascular;  Laterality: Bilateral;  . ULTRASOUND GUIDANCE FOR VASCULAR ACCESS  06/04/2018   Procedure: Ultrasound Guidance For Vascular Access;  Surgeon: Erica Mocha, MD;  Location: [place] [facility];  Service: Cardiovascular;;    Current Medications: Current Meds  Medication Sig  . amLODipine (NORVASC) 5 MG tablet Take 5 mg by mouth daily.  Marland Kitchen atenolol (TENORMIN) 25 MG tablet Take 0.5 tablets (12.5 mg total) by mouth daily.  . Calcium Carb-Cholecalciferol (CALCIUM + VITAMIN D3 PO) Take 1 tablet by mouth daily.   . Cholecalciferol (VITAMIN D) 50 MCG (2000 UT) tablet Take 1 tablet by mouth daily.  . clopidogrel (PLAVIX) 75 MG tablet Take 1 tablet (75 mg total) by mouth daily with breakfast.  . denosumab (PROLIA) 60 MG/ML SOSY injection Inject 1 mL into the skin every 6 (six) months.  . ezetimibe (ZETIA) 10 MG tablet TAKE ONE TABLET BY MOUTH DAILY  . levothyroxine (SYNTHROID, LEVOTHROID) 100 MCG tablet Take 100 mcg by mouth daily before breakfast.   . meclizine (ANTIVERT) 25 MG tablet Take 1 tablet by mouth as needed for dizziness.  . memantine (NAMENDA) 10 MG tablet Take 10 mg by mouth 2 (two) times daily.  . Multiple Vitamins-Minerals (WOMENS MULTIVITAMIN PO) Take 1 tablet by mouth daily.   . nitroGLYCERIN (NITROSTAT) 0.4 MG SL  tablet Place 1 tablet (0.4 mg total) under the tongue every 5 (five) minutes as needed for chest pain.  . rosuvastatin (CRESTOR) 40 MG tablet Take 40 mg by mouth daily.  . sertraline (ZOLOFT) 50 MG tablet Take 50 mg by mouth daily.      Allergies:   Morphine and related   Social History   Socioeconomic History  . Marital status: Widowed    Spouse name: Not on file  . Number of children: Not on file  . Years of education: Not on file  . Highest education level: Not on file  Occupational History  . Occupation: Glass blower/designer  Tobacco Use  . Smoking status: Former Smoker    Packs/day: 1.00    Years: 37.00    Pack years: 37.00    Types: Cigarettes    Quit date: 11/22/1996    Years since quitting: 24.0  . Smokeless tobacco: Never Used  Vaping Use  . Vaping Use: Never used  Substance and Sexual Activity  . Alcohol use: Yes    Comment: 10/15/2018 "COUPLE DRINKS/YEAR  . Drug use: Never  . Sexual activity: Not Currently  Other Topics Concern  . Not on file  Social History Narrative  . Not on file  Social Determinants of Health   Financial Resource Strain: Not on file  Food Insecurity: Not on file  Transportation Needs: Not on file  Physical Activity: Not on file  Stress: Not on file  Social Connections: Not on file     Family History: The patient's family history includes Other in an other family member; Other (age of onset: 78) in her mother.  ROS:   Please see the history of present illness.    All other systems reviewed and are negative.  EKGs/Labs/Other Studies Reviewed:    The following studies were reviewed today: Carotid duplex scan 06-01-2020: Summary:  Right Carotid: Velocities in the right ICA are consistent with a 1-39%  stenosis.         Non-hemodynamically significant plaque <50% noted in the  CCA.   Left Carotid: Velocities in the left ICA are consistent with a 1-39%  stenosis.        Non-hemodynamically significant plaque <50%  noted in the  CCA. The        ECA appears >50% stenosed.   Vertebrals: Bilateral vertebral arteries demonstrate antegrade flow.  Subclavians: Normal flow hemodynamics were seen in bilateral subclavian        arteries.   EKG:  EKG is not ordered today.    Recent Labs: 02/15/2020: ALT 21  Recent Lipid Panel    Component Value Date/Time   CHOL 148 02/15/2020 1109   TRIG 103 02/15/2020 1109   HDL 69 02/15/2020 1109   CHOLHDL 2.1 02/15/2020 1109   CHOLHDL 2.7 01/09/2010 0506   VLDL 17 01/09/2010 0506   LDLCALC 60 02/15/2020 1109     Risk Assessment/Calculations:     Physical Exam:    VS:  BP 120/60   Pulse (!) 54   Ht 5\' 2"  (1.575 m)   Wt 131 lb 3.2 oz (59.5 kg)   SpO2 94%   BMI 24.00 kg/m     Wt Readings from Last 3 Encounters:  12/22/20 131 lb 3.2 oz (59.5 kg)  05/16/20 132 lb 12.8 oz (60.2 kg)  11/16/19 130 lb (59 kg)     GEN:  Well nourished, well developed in no acute distress HEENT: Normal NECK: No JVD; No carotid bruits LYMPHATICS: No lymphadenopathy CARDIAC: RRR, no murmurs, rubs, gallops RESPIRATORY:  Clear to auscultation without rales, wheezing or rhonchi  ABDOMEN: Soft, non-tender, non-distended MUSCULOSKELETAL:  No edema; No deformity  SKIN: Warm and dry NEUROLOGIC:  Alert and oriented x 3 PSYCHIATRIC:  Normal affect   ASSESSMENT:    1. Coronary artery disease involving native coronary artery of native heart with angina pectoris ([place])   2. PAD (peripheral artery disease) ([place])   3. Mixed hyperlipidemia   4. Essential hypertension    PLAN:    In order of problems listed above:  1. Appears to be doing well.  No change in anginal symptoms.  No significant limitation at this time.  She is treated with clopidogrel, atenolol, and a high intensity statin drug. 2. Claudication symptoms have resolved since she underwent iliac stenting a few years ago.  She is followed by Dr. Sophronia Simas and appears to be doing very well. 3. Treated with a  combination of ezetimibe and rosuvastatin.  Most recent lipids reviewed with a cholesterol of 124, LDL 56, HDL 52. 4. Blood pressure is well controlled on a combination of amlodipine  and atenolol  Overall the patient appears to be doing well from a cardiac perspective.  I will plan to see her back in  1 year for follow-up unless problems arise in the interim.  Medication Adjustments/Labs and Tests Ordered: Current medicines are reviewed at length with the patient today.  Concerns regarding medicines are outlined above.  No orders of the defined types were placed in this encounter.  No orders of the defined types were placed in this encounter.   There are no Patient Instructions on file for this visit.   Signed, Erica Mocha, MD  12/22/2020 1:55 PM    [place]

## 2020-12-22 NOTE — Patient Instructions (Signed)

## 2020-12-23 ENCOUNTER — Ambulatory Visit: Payer: Medicare HMO | Admitting: Cardiovascular Disease

## 2021-02-21 DIAGNOSIS — R7301 Impaired fasting glucose: Secondary | ICD-10-CM | POA: Diagnosis not present

## 2021-02-21 DIAGNOSIS — I2510 Atherosclerotic heart disease of native coronary artery without angina pectoris: Secondary | ICD-10-CM | POA: Diagnosis not present

## 2021-02-21 DIAGNOSIS — R319 Hematuria, unspecified: Secondary | ICD-10-CM | POA: Diagnosis not present

## 2021-02-21 DIAGNOSIS — E039 Hypothyroidism, unspecified: Secondary | ICD-10-CM | POA: Diagnosis not present

## 2021-02-21 DIAGNOSIS — I119 Hypertensive heart disease without heart failure: Secondary | ICD-10-CM | POA: Diagnosis not present

## 2021-02-21 DIAGNOSIS — L659 Nonscarring hair loss, unspecified: Secondary | ICD-10-CM | POA: Diagnosis not present

## 2021-02-21 DIAGNOSIS — L989 Disorder of the skin and subcutaneous tissue, unspecified: Secondary | ICD-10-CM | POA: Diagnosis not present

## 2021-02-21 DIAGNOSIS — E782 Mixed hyperlipidemia: Secondary | ICD-10-CM | POA: Diagnosis not present

## 2021-02-21 DIAGNOSIS — R4189 Other symptoms and signs involving cognitive functions and awareness: Secondary | ICD-10-CM | POA: Diagnosis not present

## 2021-02-27 DIAGNOSIS — C4339 Malignant melanoma of other parts of face: Secondary | ICD-10-CM | POA: Diagnosis not present

## 2021-03-20 DIAGNOSIS — D485 Neoplasm of uncertain behavior of skin: Secondary | ICD-10-CM | POA: Diagnosis not present

## 2021-03-23 DIAGNOSIS — M810 Age-related osteoporosis without current pathological fracture: Secondary | ICD-10-CM | POA: Diagnosis not present

## 2021-03-29 DIAGNOSIS — C4339 Malignant melanoma of other parts of face: Secondary | ICD-10-CM | POA: Diagnosis not present

## 2021-04-14 DIAGNOSIS — C770 Secondary and unspecified malignant neoplasm of lymph nodes of head, face and neck: Secondary | ICD-10-CM | POA: Diagnosis not present

## 2021-04-14 DIAGNOSIS — I10 Essential (primary) hypertension: Secondary | ICD-10-CM | POA: Diagnosis not present

## 2021-04-14 DIAGNOSIS — C439 Malignant melanoma of skin, unspecified: Secondary | ICD-10-CM | POA: Diagnosis not present

## 2021-04-14 DIAGNOSIS — E039 Hypothyroidism, unspecified: Secondary | ICD-10-CM | POA: Diagnosis not present

## 2021-04-14 DIAGNOSIS — Z885 Allergy status to narcotic agent status: Secondary | ICD-10-CM | POA: Diagnosis not present

## 2021-04-14 DIAGNOSIS — C4339 Malignant melanoma of other parts of face: Secondary | ICD-10-CM | POA: Diagnosis not present

## 2021-04-24 DIAGNOSIS — I248 Other forms of acute ischemic heart disease: Secondary | ICD-10-CM | POA: Diagnosis not present

## 2021-04-24 DIAGNOSIS — I6529 Occlusion and stenosis of unspecified carotid artery: Secondary | ICD-10-CM | POA: Diagnosis not present

## 2021-04-24 DIAGNOSIS — I739 Peripheral vascular disease, unspecified: Secondary | ICD-10-CM | POA: Diagnosis not present

## 2021-04-24 DIAGNOSIS — Z20822 Contact with and (suspected) exposure to covid-19: Secondary | ICD-10-CM | POA: Diagnosis not present

## 2021-04-24 DIAGNOSIS — R0902 Hypoxemia: Secondary | ICD-10-CM | POA: Diagnosis not present

## 2021-04-24 DIAGNOSIS — J811 Chronic pulmonary edema: Secondary | ICD-10-CM | POA: Diagnosis not present

## 2021-04-24 DIAGNOSIS — I959 Hypotension, unspecified: Secondary | ICD-10-CM | POA: Diagnosis not present

## 2021-04-24 DIAGNOSIS — G8918 Other acute postprocedural pain: Secondary | ICD-10-CM | POA: Diagnosis not present

## 2021-04-24 DIAGNOSIS — C4339 Malignant melanoma of other parts of face: Secondary | ICD-10-CM | POA: Diagnosis not present

## 2021-04-24 DIAGNOSIS — I10 Essential (primary) hypertension: Secondary | ICD-10-CM | POA: Diagnosis not present

## 2021-04-24 DIAGNOSIS — E8770 Fluid overload, unspecified: Secondary | ICD-10-CM | POA: Diagnosis not present

## 2021-04-24 DIAGNOSIS — I2510 Atherosclerotic heart disease of native coronary artery without angina pectoris: Secondary | ICD-10-CM | POA: Diagnosis not present

## 2021-04-24 DIAGNOSIS — R079 Chest pain, unspecified: Secondary | ICD-10-CM | POA: Diagnosis not present

## 2021-04-24 DIAGNOSIS — R0789 Other chest pain: Secondary | ICD-10-CM | POA: Diagnosis not present

## 2021-04-24 DIAGNOSIS — Z955 Presence of coronary angioplasty implant and graft: Secondary | ICD-10-CM | POA: Diagnosis not present

## 2021-04-24 DIAGNOSIS — R001 Bradycardia, unspecified: Secondary | ICD-10-CM | POA: Diagnosis not present

## 2021-04-25 DIAGNOSIS — R079 Chest pain, unspecified: Secondary | ICD-10-CM | POA: Diagnosis not present

## 2021-04-25 DIAGNOSIS — E8770 Fluid overload, unspecified: Secondary | ICD-10-CM | POA: Diagnosis not present

## 2021-04-25 DIAGNOSIS — R001 Bradycardia, unspecified: Secondary | ICD-10-CM | POA: Diagnosis not present

## 2021-04-25 DIAGNOSIS — R0902 Hypoxemia: Secondary | ICD-10-CM | POA: Diagnosis not present

## 2021-05-04 ENCOUNTER — Encounter: Payer: Self-pay | Admitting: Cardiovascular Disease

## 2021-05-04 ENCOUNTER — Other Ambulatory Visit: Payer: Self-pay

## 2021-05-04 ENCOUNTER — Ambulatory Visit (INDEPENDENT_AMBULATORY_CARE_PROVIDER_SITE_OTHER): Payer: Medicare HMO | Admitting: Cardiovascular Disease

## 2021-05-04 VITALS — BP 140/70 | HR 55 | Ht 61.0 in | Wt 127.0 lb

## 2021-05-04 DIAGNOSIS — I10 Essential (primary) hypertension: Secondary | ICD-10-CM | POA: Diagnosis not present

## 2021-05-04 DIAGNOSIS — I25119 Atherosclerotic heart disease of native coronary artery with unspecified angina pectoris: Secondary | ICD-10-CM

## 2021-05-04 DIAGNOSIS — I779 Disorder of arteries and arterioles, unspecified: Secondary | ICD-10-CM

## 2021-05-04 DIAGNOSIS — E782 Mixed hyperlipidemia: Secondary | ICD-10-CM | POA: Diagnosis not present

## 2021-05-04 MED ORDER — CLOPIDOGREL BISULFATE 75 MG PO TABS: 75.0000 mg | ORAL_TABLET | Freq: Every day | ORAL | 3 refills | Status: AC

## 2021-05-04 MED ORDER — NITROGLYCERIN 0.4 MG SL SUBL: 0.4000 mg | SUBLINGUAL_TABLET | SUBLINGUAL | 3 refills | Status: DC | PRN

## 2021-05-04 NOTE — Patient Instructions (Signed)
Medication Instructions:  1) STOP ATENOLOL  2) RESTART PLAVIX 75 mg daily *If you need a refill on your cardiac medications before your next appointment, please call your pharmacy*  Testing/Procedures: Your provider has requested that you have a lexiscan myoview. For further information please visit [email or web address]. Please follow instruction sheet, as given.  Follow-Up: At [facility], you and your health needs are our priority.  As part of our continuing mission to provide you with exceptional heart care, we have created designated Provider Care Teams.  These Care Teams include your primary Cardiologist (physician) and Advanced Practice Providers (APPs -  Physician Assistants and Nurse Practitioners) who all work together to provide you with the care you need, when you need it. Your next appointment:   3 month(s) The format for your next appointment:   In Person Provider:   Richardson Dopp, PA-C

## 2021-05-04 NOTE — Progress Notes (Signed)
Cardiology Office Note:    Date:  05/04/2021   ID:  Erica [name], DOB [birth year], MRN [id number]  PCP:  Mayra Neer, MD   [place] [facility] Cardiologist:  [name], MD     Referring MD: Mayra Neer, MD   No chief complaint on file.   History of Present Illness:    Erica [name] is a 80 y.o. female with a hx of coronary artery disease, presenting for follow-up evaluation today.  The patient underwent multivessel CABG in 2011.  Comorbid conditions include carotid stenosis, mixed hyperlipidemia, and peripheral arterial disease.  In 2019 the patient underwent balloon angioplasty of the right coronary artery for treatment of severe in-stent restenosis.  She then underwent atherectomy and stenting of the proximal LAD in staged fashion.  She was treated with bilateral iliac stenting later that year for treatment of intermittent claudication.  She recently had excision of a melanoma and post-operatively she developed chest discomfort. She was observed overnight and was noted to have a mild troponin increase. Pain was self-limited and she was discharged home with close cardiology follow-up recommended. She has had no recurrent chest pain or pressure and feels well at present. She does have exertional dyspnea. No other acute complaints. States that her chest pain generally occurs at rest. Denies recent acceleration of symptoms.   Past Medical History:  Diagnosis Date  . Anxiety   . Chronic mid back pain   . Coronary artery disease    status post multiple percutaneous interventions  . Depression   . History of kidney stones 1990s X 1; 2016   . Hyperlipidemia   . Hypertension   . Hypothyroidism   . Myocardial infarction ([place]) 01/2010  . Osteoarthritis    "hands, back" (10/15/2018)    Past Surgical History:  Procedure Laterality Date  . ABDOMINAL AORTOGRAM W/LOWER EXTREMITY  10/15/2018  . ABDOMINAL AORTOGRAM W/LOWER EXTREMITY N/A 10/15/2018   Procedure: ABDOMINAL  AORTOGRAM W/LOWER EXTREMITY;  Surgeon: Wellington Hampshire, MD;  Location: [place] [facility];  Service: Cardiovascular;  Laterality: N/A;  . CARDIAC CATHETERIZATION  12-2008  . CATARACT EXTRACTION W/ INTRAOCULAR LENS IMPLANT Bilateral   . CORONARY ANGIOPLASTY WITH STENT PLACEMENT  2006    right coronary artery with drug-eluting stent  . CORONARY ANGIOPLASTY WITH STENT PLACEMENT  1998    bare-metal stent to the right coronary artery  . CORONARY ANGIOPLASTY WITH STENT PLACEMENT  2008   drug-eluting stent placed in the left circumflex  . CORONARY ARTERY BYPASS GRAFT  01-25-10   CABG X4  . CORONARY ATHERECTOMY N/A 07/16/2018   Procedure: CORONARY ATHERECTOMY;  Surgeon: [name], MD;  Location: [place] [facility];  Service: Cardiovascular;  Laterality: N/A;  . CORONARY BALLOON ANGIOPLASTY N/A 06/04/2018   Procedure: CORONARY BALLOON ANGIOPLASTY;  Surgeon: [name], MD;  Location: [place] [facility];  Service: Cardiovascular;  Laterality: N/A;  . CYSTOSCOPY WITH RETROGRADE PYELOGRAM, URETEROSCOPY AND STENT PLACEMENT Right 10/18/2015   Procedure: CYSTOSCOPY WITH RETROGRADE PYELOGRAM,  AND STENT PLACEMENT;  Surgeon: Festus Aloe, MD;  [facility]: [facility];  Service: Urology;  Laterality: Right;  . CYSTOSCOPY/URETEROSCOPY/HOLMIUM LASER/STENT PLACEMENT Right 11/29/2015   Procedure: RIGHT URETEROSCOPY/RIGHT RETROGRADE PYELOGRAM/ RIGHT STENT REPLACEMENT;  Surgeon: Festus Aloe, MD;  Location[facility];  Service: Urology;  Laterality: Right;  . FOOT SURGERY Bilateral 2008-2016   "bones shortened prox to  great toes"  . HAMMER TOE SURGERY Right 2008   2nd and 3rd digits  . INTRAVASCULAR PRESSURE WIRE/FFR STUDY  N/A 06/04/2018   Procedure: INTRAVASCULAR PRESSURE WIRE/FFR STUDY;  Surgeon: [name], MD;  Location: [place] [facility];  Service: Cardiovascular;  Laterality: N/A;  . LEFT HEART CATH AND CORS/GRAFTS ANGIOGRAPHY N/A 06/04/2018   Procedure: LEFT HEART CATH AND  CORS/GRAFTS ANGIOGRAPHY;  Surgeon: [name], MD;  Location: [place] [facility];  Service: Cardiovascular;  Laterality: N/A;  . PERIPHERAL VASCULAR INTERVENTION Bilateral 10/15/2018  . PERIPHERAL VASCULAR INTERVENTION Bilateral 10/15/2018   Procedure: PERIPHERAL VASCULAR INTERVENTION;  Surgeon: Wellington Hampshire, MD;  Location: [place] [facility];  Service: Cardiovascular;  Laterality: Bilateral;  . ULTRASOUND GUIDANCE FOR VASCULAR ACCESS  06/04/2018   Procedure: Ultrasound Guidance For Vascular Access;  Surgeon: [name], MD;  Location: [place] [facility];  Service: Cardiovascular;;    Current Medications: Current Meds  Medication Sig  . amLODipine (NORVASC) 5 MG tablet Take 5 mg by mouth daily.  . Calcium Carb-Cholecalciferol (CALCIUM + VITAMIN D3 PO) Take 1 tablet by mouth daily.   . Cholecalciferol (VITAMIN D) 50 MCG (2000 UT) tablet Take 1 tablet by mouth daily.  Marland Kitchen denosumab (PROLIA) 60 MG/ML SOSY injection Inject 1 mL into the skin every 6 (six) months.  . doxepin (SINEQUAN) 10 MG capsule Take by mouth.  . ezetimibe (ZETIA) 10 MG tablet TAKE ONE TABLET BY MOUTH DAILY  . levothyroxine (SYNTHROID, LEVOTHROID) 100 MCG tablet Take 100 mcg by mouth daily before breakfast.   . memantine (NAMENDA) 10 MG tablet Take 10 mg by mouth 2 (two) times daily.  . Multiple Vitamins-Minerals (WOMENS MULTIVITAMIN PO) Take 1 tablet by mouth daily.   . nitroGLYCERIN (NITROSTAT) 0.4 MG SL tablet Place 1 tablet (0.4 mg total) under the tongue every 5 (five) minutes as needed for chest pain.  . rosuvastatin (CRESTOR) 40 MG tablet Take 40 mg by mouth daily.  . sertraline (ZOLOFT) 50 MG tablet Take 50 mg by mouth daily.      Allergies:   Boniva [ibandronic acid], Isosulfan blue, and Morphine and related   Social History   Socioeconomic History  . Marital status: Widowed    Spouse name: Not on file  . Number of children: Not on file  . Years of education: Not on file  . Highest education  level: Not on file  Occupational History  . Occupation: Glass blower/designer  Tobacco Use  . Smoking status: Former Smoker    Packs/day: 1.00    Years: 37.00    Pack years: 37.00    Types: Cigarettes    Quit date: 11/22/1996    Years since quitting: 24.4  . Smokeless tobacco: Never Used  Vaping Use  . Vaping Use: Never used  Substance and Sexual Activity  . Alcohol use: Yes    Comment: 10/15/2018 "COUPLE DRINKS/YEAR  . Drug use: Never  . Sexual activity: Not Currently  Other Topics Concern  . Not on file  Social History Narrative  . Not on file   Social Determinants of Health   Financial Resource Strain: Not on file  Food Insecurity: Not on file  Transportation Needs: Not on file  Physical Activity: Not on file  Stress: Not on file  Social Connections: Not on file     Family History: The patient's family history includes Other in an other family member; Other (age of onset: 2) in her mother.  ROS:   Please see the history of present illness.    All other systems reviewed and are negative.  EKGs/Labs/Other Studies Reviewed:    The following  studies were reviewed today: Myoview Stress Test 12/04/2016: Study Highlights    Nuclear stress EF: 60%.  There was no ST segment deviation noted during stress.  The study is normal.  The left ventricular ejection fraction is normal (55-65%).   Normal study, no evidence for ischemia or infarction.    EKG:  EKG is ordered today.  The ekg ordered today demonstrates sinus bradycardia 55 bpm, St-T wave abnormality consider inferolateral ischemia, no change from prior tracings  Recent Labs: No results found for requested labs within last 8760 hours.  Recent Lipid Panel    Component Value Date/Time   CHOL 148 02/15/2020 1109   TRIG 103 02/15/2020 1109   HDL 69 02/15/2020 1109   CHOLHDL 2.1 02/15/2020 1109   CHOLHDL 2.7 01/09/2010 0506   VLDL 17 01/09/2010 0506   LDLCALC 60 02/15/2020 1109     Risk  Assessment/Calculations:       Physical Exam:    VS:  BP 140/70   Pulse (!) 55   Ht 5\' 1"  (1.549 m)   Wt 127 lb (57.6 kg)   SpO2 95%   BMI 24.00 kg/m     Wt Readings from Last 3 Encounters:  05/04/21 127 lb (57.6 kg)  12/22/20 131 lb 3.2 oz (59.5 kg)  05/16/20 132 lb 12.8 oz (60.2 kg)     GEN:  Well nourished, well developed in no acute distress HEENT: Normal NECK: No JVD; right carotid bruit LYMPHATICS: No lymphadenopathy CARDIAC: RRR, no murmurs, rubs, gallops RESPIRATORY:  Clear to auscultation without rales, wheezing or rhonchi  ABDOMEN: Soft, non-tender, non-distended MUSCULOSKELETAL:  No edema; No deformity  SKIN: Warm and dry NEUROLOGIC:  Alert and oriented x 3 PSYCHIATRIC:  Normal affect   ASSESSMENT:    1. Coronary artery disease involving native coronary artery of native heart with angina pectoris ([place])   2. Carotid artery disease, unspecified laterality ([place])   3. Mixed hyperlipidemia   4. Essential hypertension    PLAN:    In order of problems listed above:  1. Reviewed patient's symptoms carefully with her.  She really has not had much change in angina with the exception of postoperative angina occurred with her recent surgery.  She had a very small troponin increase seen over serial troponins done in the [facility] system.  I reviewed these today with her.  She will start back on clopidogrel.  Her nitroglycerin prescription will be updated.  She will otherwise continue her current medications and she will remain off of atenolol because of baseline bradycardia and low normal blood pressure.  I am going to obtain a Lexiscan Myoview stress test for further risk stratification.  Certainly if she has significant increase in angina or if she has a moderate or high risk Myoview scan because of inducible ischemia, I would be inclined to proceed with cardiac catheter patient. 2. Right carotid bruit noted.  Previous carotid duplex reviewed.  She is already scheduled for a  carotid duplex study for routine surveillance.  We will continue current medications. 3. Treated with rosuvastatin 40 mg daily and Zetia 10 mg daily.  Last lipids showed a cholesterol of 143, HDL 58, LDL 69, and triglycerides 86.  ALT was 20. 4. Blood pressure controlled on amlodipine.  Advised her to remain off of atenolol.   Shared Decision Making/Informed Consent The risks [chest pain, shortness of breath, cardiac arrhythmias, dizziness, blood pressure fluctuations, myocardial infarction, stroke/transient ischemic attack, nausea, vomiting, allergic reaction, radiation exposure, metallic taste sensation and life-threatening complications (  estimated to be 1 in 10,000)], benefits (risk stratification, diagnosing coronary artery disease, treatment guidance) and alternatives of a nuclear stress test were discussed in detail with Ms. Pieper and she agrees to proceed.       Medication Adjustments/Labs and Tests Ordered: Current medicines are reviewed at length with the patient today.  Concerns regarding medicines are outlined above.  No orders of the defined types were placed in this encounter.  No orders of the defined types were placed in this encounter.   There are no Patient Instructions on file for this visit.   Signed, [name], MD  05/04/2021 4:29 PM    [place]

## 2021-05-08 ENCOUNTER — Other Ambulatory Visit (HOSPITAL_COMMUNITY): Payer: Self-pay | Admitting: Cardiovascular Disease

## 2021-05-08 DIAGNOSIS — I739 Peripheral vascular disease, unspecified: Secondary | ICD-10-CM

## 2021-05-08 DIAGNOSIS — I6523 Occlusion and stenosis of bilateral carotid arteries: Secondary | ICD-10-CM

## 2021-05-09 DIAGNOSIS — Z09 Encounter for follow-up examination after completed treatment for conditions other than malignant neoplasm: Secondary | ICD-10-CM | POA: Diagnosis not present

## 2021-05-09 DIAGNOSIS — C4339 Malignant melanoma of other parts of face: Secondary | ICD-10-CM | POA: Diagnosis not present

## 2021-05-16 ENCOUNTER — Telehealth (HOSPITAL_COMMUNITY): Payer: Self-pay | Admitting: *Deleted

## 2021-05-16 NOTE — Telephone Encounter (Signed)
Patient given detailed instructions per Myocardial Perfusion Study Information Sheet for the test on 05/24/21 Patient notified to arrive 15 minutes early and that it is imperative to arrive on time for appointment to keep from having the test rescheduled.  If you need to cancel or reschedule your appointment, please call the office within 24 hours of your appointment. . Patient verbalized understanding. Erica [name]

## 2021-05-23 DIAGNOSIS — H6120 Impacted cerumen, unspecified ear: Secondary | ICD-10-CM | POA: Diagnosis not present

## 2021-05-23 DIAGNOSIS — I779 Disorder of arteries and arterioles, unspecified: Secondary | ICD-10-CM | POA: Diagnosis not present

## 2021-05-23 DIAGNOSIS — I2510 Atherosclerotic heart disease of native coronary artery without angina pectoris: Secondary | ICD-10-CM | POA: Diagnosis not present

## 2021-05-23 DIAGNOSIS — C4330 Malignant melanoma of unspecified part of face: Secondary | ICD-10-CM | POA: Diagnosis not present

## 2021-05-23 DIAGNOSIS — I209 Angina pectoris, unspecified: Secondary | ICD-10-CM | POA: Diagnosis not present

## 2021-05-24 ENCOUNTER — Ambulatory Visit (HOSPITAL_COMMUNITY): Payer: Medicare HMO | Attending: Cardiology

## 2021-05-24 ENCOUNTER — Other Ambulatory Visit: Payer: Self-pay

## 2021-05-24 DIAGNOSIS — I25119 Atherosclerotic heart disease of native coronary artery with unspecified angina pectoris: Secondary | ICD-10-CM | POA: Diagnosis not present

## 2021-05-24 LAB — MYOCARDIAL PERFUSION IMAGING
LV dias vol: 90 mL (ref 46–106)
LV sys vol: 31 mL
Peak HR: 63 {beats}/min
Rest HR: 49 {beats}/min
SDS: 1
SRS: 0
SSS: 1
TID: 1.12

## 2021-05-24 MED ORDER — TECHNETIUM TC 99M TETROFOSMIN IV KIT
11.0000 | PACK | Freq: Once | INTRAVENOUS | Status: AC | PRN
Start: 2021-05-24 — End: 2021-05-24
  Administered 2021-05-24: 11 via INTRAVENOUS
  Filled 2021-05-24: qty 11

## 2021-05-24 MED ORDER — TECHNETIUM TC 99M TETROFOSMIN IV KIT
30.8000 | PACK | Freq: Once | INTRAVENOUS | Status: AC | PRN
Start: 2021-05-24 — End: 2021-05-24
  Administered 2021-05-24: 30.8 via INTRAVENOUS
  Filled 2021-05-24: qty 31

## 2021-05-24 MED ORDER — REGADENOSON 0.4 MG/5ML IV SOLN
0.4000 mg | Freq: Once | INTRAVENOUS | Status: AC
  Administered 2021-05-24: 0.4 mg via INTRAVENOUS

## 2021-06-02 ENCOUNTER — Other Ambulatory Visit: Payer: Self-pay

## 2021-06-02 ENCOUNTER — Other Ambulatory Visit (HOSPITAL_COMMUNITY): Payer: Self-pay | Admitting: Cardiovascular Disease

## 2021-06-02 ENCOUNTER — Ambulatory Visit (HOSPITAL_COMMUNITY)
Admission: RE | Admit: 2021-06-02 | Discharge: 2021-06-02 | Disposition: A | Discharge status: Home / Self Care | Payer: Medicare HMO | Source: Ambulatory Visit | Attending: Cardiovascular Disease | Admitting: Cardiovascular Disease

## 2021-06-02 ENCOUNTER — Ambulatory Visit (HOSPITAL_BASED_OUTPATIENT_CLINIC_OR_DEPARTMENT_OTHER)
Admission: RE | Admit: 2021-06-02 | Discharge: 2021-06-02 | Disposition: A | Discharge status: Home / Self Care | Payer: Medicare HMO | Source: Ambulatory Visit | Attending: Cardiovascular Disease | Admitting: Cardiovascular Disease

## 2021-06-02 DIAGNOSIS — I739 Peripheral vascular disease, unspecified: Secondary | ICD-10-CM

## 2021-06-02 DIAGNOSIS — I6523 Occlusion and stenosis of bilateral carotid arteries: Secondary | ICD-10-CM

## 2021-06-02 DIAGNOSIS — Z95828 Presence of other vascular implants and grafts: Secondary | ICD-10-CM | POA: Diagnosis not present

## 2021-06-13 ENCOUNTER — Encounter: Payer: Self-pay | Admitting: Cardiovascular Disease

## 2021-06-13 ENCOUNTER — Ambulatory Visit: Payer: Medicare HMO | Admitting: Cardiovascular Disease

## 2021-06-13 ENCOUNTER — Other Ambulatory Visit: Payer: Self-pay

## 2021-06-13 VITALS — BP 140/68 | HR 55 | Resp 18 | Ht 61.0 in | Wt 127.2 lb

## 2021-06-13 DIAGNOSIS — I779 Disorder of arteries and arterioles, unspecified: Secondary | ICD-10-CM

## 2021-06-13 DIAGNOSIS — I739 Peripheral vascular disease, unspecified: Secondary | ICD-10-CM

## 2021-06-13 DIAGNOSIS — E7800 Pure hypercholesterolemia, unspecified: Secondary | ICD-10-CM | POA: Diagnosis not present

## 2021-06-13 DIAGNOSIS — I2510 Atherosclerotic heart disease of native coronary artery without angina pectoris: Secondary | ICD-10-CM | POA: Diagnosis not present

## 2021-06-13 DIAGNOSIS — I10 Essential (primary) hypertension: Secondary | ICD-10-CM | POA: Diagnosis not present

## 2021-06-13 NOTE — Patient Instructions (Signed)
Medication Instructions:  No Changes In Medications at this time.  *If you need a refill on your cardiac medications before your next appointment, please call your pharmacy*  Follow-Up: At [facility], you and your health needs are our priority.  As part of our continuing mission to provide you with exceptional heart care, we have created designated Provider Care Teams.  These Care Teams include your primary Cardiologist (physician) and Advanced Practice Providers (APPs -  Physician Assistants and Nurse Practitioners) who all work together to provide you with the care you need, when you need it.  Your next appointment:   1 year(s)  The format for your next appointment:   In Person  Provider:   Kathlyn Sacramento, MD

## 2021-06-13 NOTE — Progress Notes (Signed)
Cardiology Office Note   Date:  09/23/2018   ID:  Erica [name], DOB January 30, 1941, MRN [id number]  PCP:  Mayra Neer, MD  Cardiologist:  Dr. Burt Knack   Chief Complaint  Patient presents with   Follow-up    pt denied chest pain      History of Present Illness: Erica [name] is a 80 y.o. female who is here today for follow-up visit regarding peripheral arterial disease.    She has known history of coronary artery disease status post CABG in 2011, mild to moderate carotid disease, hyperlipidemia and essential hypertension.  She quit smoking at the age of 75. Cardiac catheterization in July,2019 showed significant restenosis in the right coronary artery which was treated with cutting balloon angioplasty.  There was significant calcified LAD disease which was treated with staged atherectomy and drug-eluting stent placement in August.  She had worsening claudication in 2019 and underwent angiography which showed moderate distal aortic stenosis above the iliac bifurcation not significant by gradient, 60% stenosis of the right common iliac artery and mild common femoral artery disease and no significant infrainguinal disease.  On the left, there was significant left common iliac artery stenosis and mild left common femoral artery stenosis.  I performed successful bilateral common iliac artery kissing stent placement extending into the distal aorta.   Most recent Doppler studies earlier this month showed an ABI of 1.06 on the right and 0.99 in the left.  Duplex showed patent iliac stents with minimal restenosis. Most recent carotid Doppler earlier this month also showed moderate left carotid stenosis.  I reviewed the results of recent [place] done in June which showed no evidence of ischemia with normal ejection fraction.  She had melanoma surgery in May at [facility] and had some elevated troponin at that time.  That was the reason of recent stress test.  She denies chest pain or  worsening dyspnea.  She reports no leg claudication.   Diagnosis Date   Anxiety    Chronic mid back pain    Coronary artery disease    status post multiple percutaneous interventions   Depression    History of kidney stones 1990s X 1; 2016    Hyperlipidemia    Hypertension    Hypothyroidism    Myocardial infarction ([place]) 01/2010   Osteoarthritis    "hands, back" (06/04/2018)    Past Surgical History:  Procedure Laterality Date   CARDIAC CATHETERIZATION  12-2008   CATARACT EXTRACTION W/ INTRAOCULAR LENS IMPLANT Bilateral    CORONARY ANGIOPLASTY WITH STENT PLACEMENT  2006    right coronary artery with drug-eluting stent   CORONARY ANGIOPLASTY WITH [place]    bare-metal stent to the right coronary artery   CORONARY ANGIOPLASTY WITH STENT PLACEMENT  2008   drug-eluting stent placed in the left circumflex   CORONARY ARTERY BYPASS GRAFT  01-25-10   CABG X4   CORONARY ATHERECTOMY N/A 07/16/2018   Procedure: CORONARY ATHERECTOMY;  Surgeon: [name], MD;  Location: [place] [facility];  Service: Cardiovascular;  Laterality: N/A;   CORONARY BALLOON ANGIOPLASTY  06/04/2018   cutting balloon   CORONARY BALLOON ANGIOPLASTY N/A 06/04/2018   Procedure: CORONARY BALLOON ANGIOPLASTY;  Surgeon: [name], MD;  Location: [place] [facility];  Service: Cardiovascular;  Laterality: N/A;   CYSTOSCOPY WITH RETROGRADE PYELOGRAM, URETEROSCOPY AND STENT PLACEMENT Right 10/18/2015   Procedure: CYSTOSCOPY WITH RETROGRADE PYELOGRAM,  AND STENT PLACEMENT;  Surgeon: Festus Aloe, MD;  Location: [facility];  Service: Urology;  Laterality: Right;   CYSTOSCOPY/URETEROSCOPY/HOLMIUM LASER/STENT PLACEMENT Right 11/29/2015   Procedure: RIGHT URETEROSCOPY/RIGHT RETROGRADE PYELOGRAM/ RIGHT STENT REPLACEMENT;  Surgeon: Festus Aloe, MD;  Location: [facility];  Service: Urology;  Laterality: Right;   FOOT SURGERY Bilateral 2008-2016   "bones shortened"   INTRAVASCULAR PRESSURE  WIRE/FFR STUDY N/A 06/04/2018   Procedure: INTRAVASCULAR PRESSURE WIRE/FFR STUDY;  Surgeon: [name], MD;  Location: [place] [facility];  Service: Cardiovascular;  Laterality: N/A;   LEFT HEART CATH AND CORS/GRAFTS ANGIOGRAPHY N/A 06/04/2018   Procedure: LEFT HEART CATH AND CORS/GRAFTS ANGIOGRAPHY;  Surgeon: [name], MD;  Location: [place] [facility];  Service: Cardiovascular;  Laterality: N/A;   ULTRASOUND GUIDANCE FOR VASCULAR ACCESS  06/04/2018   Procedure: Ultrasound Guidance For Vascular Access;  Surgeon: [name], MD;  Location: [place] [facility];  Service: Cardiovascular;;     Current Outpatient Medications  Medication Sig Dispense Refill   amLODipine (NORVASC) 2.5 MG tablet Take 1 tablet (2.5 mg total) by mouth daily. 90 tablet 3   aspirin 81 MG tablet Take 1 tablet (81 mg total) by mouth daily. 30 tablet 0   atenolol (TENORMIN) 25 MG tablet Take 0.5 tablets (12.5 mg total) by mouth daily. 30 tablet 2   atorvastatin (LIPITOR) 80 MG tablet Take 80 mg by mouth daily.     clopidogrel (PLAVIX) 75 MG tablet Take 1 tablet (75 mg total) by mouth daily with breakfast. 90 tablet 2   colesevelam (WELCHOL) 625 MG tablet Take 1,250 mg by mouth 2 (two) times daily with a meal.     hydrochlorothiazide (HYDRODIURIL) 25 MG tablet Take 25 mg by mouth daily.     levothyroxine (SYNTHROID, LEVOTHROID) 100 MCG tablet Take 100 mcg by mouth daily.     Multiple Vitamins-Minerals (WOMENS MULTIVITAMIN PO) Take 1 tablet by mouth 3 (three) times a week.     nitroGLYCERIN (NITROSTAT) 0.4 MG SL tablet Place 1 tablet (0.4 mg total) under the tongue every 5 (five) minutes as needed for chest pain. 25 tablet 3   sertraline (ZOLOFT) 50 MG tablet Take 50 mg by mouth daily.      No current facility-administered medications for this visit.     Allergies:   Morphine and related    Social History:  The patient  reports that she quit smoking about 21 years ago. Her smoking use included cigarettes. She  has a 37.00 pack-year smoking history. She has never used smokeless tobacco. She reports that she drank alcohol. She reports that she does not use drugs.   Family History:  The patient's family history includes Other in her unknown relative; Other (age of onset: 33) in her mother.    ROS:  Please see the history of present illness.   Otherwise, review of systems are positive for none.   All other systems are reviewed and negative.    PHYSICAL EXAM: VS:  BP (!) 143/62   Pulse (!) 49   Ht 5\' 2"  (1.575 m)   Wt 125 lb 12.8 oz (57.1 kg)   BMI 23.01 kg/m  , BMI Body mass index is 23.01 kg/m. GEN: Well nourished, well developed, in no acute distress  HEENT: normal  Neck: no JVD, or masses.  Faint right carotid bruit Cardiac: RRR; no murmurs, rubs, or gallops,no edema  Respiratory:  clear to auscultation bilaterally, normal work of breathing GI: soft, nontender, nondistended, + BS MS: no deformity or atrophy  Skin: warm and dry, no rash Neuro:  Strength and sensation are intact Psych: euthymic mood, full affect Vascular: Femoral pulses +2 bilaterally.  Distal pulses are palpable on both sides.   EKG:  EKG is not ordered today.     ASSESSMENT AND PLAN:  1.  Peripheral arterial disease: Status post bilateral common iliac artery stent placement.  No recurrent claudications.  I discussed the results of recent Doppler studies with her which showed normal ABI bilaterally with minimal restenosis.  She has palpable pulses by physical exam.  I requested repeat ABI and lower extremity duplex to be done in 1 year.  2.  Coronary artery disease involving native coronary arteries without angina: Continue medical therapy .  Recent Lexiscan Myoview showed no evidence of ischemia.  3.  Renal artery stenosis: Noted on CTA on the right side.  No indication for revascularization given that her blood pressure is controlled with no heart failure or progressive chronic kidney disease.  4.  Essential  hypertension: Blood pressure is controlled with amlodipine.  Atenolol was discontinued due to bradycardia.  5.   Hyperlipidemia: She is currently on high-dose rosuvastatin and Zetia.  I reviewed most recent lipid profile done in March 2021 which showed an LDL of 60 and triglyceride of 103.  Both at target.  I asked her to continue both medications.  6.  Carotid artery disease with moderate right ICA stenosis.  I reviewed the results of recent carotid Doppler with her and recommend repeat study in 1 year.   Disposition:   FU with me in 12 months  Signed,  Kathlyn Sacramento, MD  09/23/2018 10:26 AM    [place]

## 2021-06-16 DIAGNOSIS — D0339 Melanoma in situ of other parts of face: Secondary | ICD-10-CM | POA: Diagnosis not present

## 2021-06-16 DIAGNOSIS — Z85820 Personal history of malignant melanoma of skin: Secondary | ICD-10-CM | POA: Diagnosis not present

## 2021-08-22 NOTE — Progress Notes (Signed)
Cardiology Office Note:    Date:  08/23/2021   ID:  Erica [name], DOB 1941/04/25, MRN [id number]  PCP:  Erica Neer, MD  [facility] Providers Cardiologist:  Erica Mocha, MD     Referring MD: Erica Neer, MD   Chief Complaint:  F/u CAD    Patient Profile:   Erica [name] is a 80 y.o. female with:  Coronary artery disease  S/p CABG in 2011 S/p multiple PCIs S/p POBA to RCA due to ISR + staged atherectomy/stenting to LAD in 2019  Carotid artery dz Hyperlipidemia  Hypertension  Peripheral arterial disease  S/p bilat Iliac artery stenting in 2019  R RAS on prior CT >>med Rx  Melanoma s/p resection  Hypothyroidism  Bradycardia >> beta-blocker DC'd    Prior CV studies: CAROTID US 06/02/21 R 1-39; L 40-59  ABIs 06/02/21 R 1.06; L 0.99   GATED SPECT MYO PERF W/LEXISCAN STRESS 1D 05/24/2021 Narrative  The left ventricular ejection fraction is normal (55-65%).  Nuclear stress EF: 65%.  There was no ST segment deviation noted during stress.  No T wave inversion was noted during stress.  The study is normal.  This is a low risk study. There was brief but significant bradycardia with lexiscan injection, returned to baseline after several seconds. Low risk study without evidence of ischemia.  CORONARY ATHERECTOMY 07/16/2018 Narrative Successful PCI of the LAD using orbital atherectomy and stenting with a 2.75x20 mm Synergy DES  Recommend uninterrupted dual antiplatelet therapy with Aspirin 81mg  daily and Clopidogrel 75mg  daily for a minimum of 12 months with proximal LAD stenting and recent RCA angioplasty of severe in-stent restenosis.   LEFT HEART CATH  06/04/2018 Narrative  Ost Cx to Prox Cx lesion is 100% stenosed.  SVG and is normal in caliber.  LIMA.  Ost LAD to Prox LAD lesion is 75% stenosed.  Mid RCA lesion is 75% stenosed.  Scoring balloon angioplasty was performed using a BALLOON WOLVERINE 2.50X10.  Post intervention, there is a  10% residual stenosis.  Ost RCA to Prox RCA lesion is 40% stenosed.  Seq SVG-.  Origin lesion before RPDA is 100% stenosed.  The left ventricular systolic function is normal.  LV end diastolic pressure is normal.  1. Severe multivessel CAD with total occlusion of the LCx, severe in-stent restenosis in the RCA, and moderately severe proximal LAD stenosis 2. 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 Mild segmental LV contraction abnormality with preserved LVEF of 55% 4. FFR of the proximal LAD = 0.77 5. FFR of the mid-RCA = 0.75, treated successfully with cutting balloon angioplasty  Recommend: Staged PCI of the LAD - will require orbital atherectomy and stenting     History of Present Illness: Erica [name] was last seen by Dr. Burt [name] in June 2022.  She had developed chest pain and mildly elevated hs-Trops post-operatively after melanoma resection.  A f/u Myoview was obtained and demonstrated no ischemia.  She returns for f/u.  She is here alone.  Over the past couple of months, she has noted shortness of breath with more extreme activities.  She does not have exertional chest discomfort.  However, she does note a feeling of indigestion when she lays down at night.  This typically occurs after eating later than normal.  It reminds her of her previous angina.  She takes nitroglycerin with relief.  This occurs about every 2 to 3 weeks.  She has not  had orthopnea, leg edema, syncope.        Past Medical History:  Diagnosis Date   Anxiety    Chronic mid back pain    Coronary artery disease    status post multiple percutaneous interventions   Depression    History of kidney stones 1990s X 1; 2016    Hyperlipidemia    Hypertension    Hypothyroidism    Myocardial infarction ([place]) 01/2010   Osteoarthritis    "hands, back" (10/15/2018)   Current Medications: Current Meds  Medication Sig   amLODipine (NORVASC) 5 MG  tablet Take 5 mg by mouth daily.   Calcium Carb-Cholecalciferol (CALCIUM + VITAMIN D3 PO) Take 1 tablet by mouth daily.    Cholecalciferol (VITAMIN D) 50 MCG (2000 UT) tablet Take 1 tablet by mouth daily.   clopidogrel (PLAVIX) 75 MG tablet Take 1 tablet (75 mg total) by mouth daily.   denosumab (PROLIA) 60 MG/ML SOSY injection Inject 1 mL into the skin every 6 (six) months.   doxepin (SINEQUAN) 10 MG capsule Take by mouth.   ezetimibe (ZETIA) 10 MG tablet TAKE ONE TABLET BY MOUTH DAILY   isosorbide mononitrate (IMDUR) 30 MG 24 hr tablet Take 0.5 tablets (15 mg total) by mouth daily.   levothyroxine (SYNTHROID, LEVOTHROID) 100 MCG tablet Take 100 mcg by mouth daily before breakfast.    memantine (NAMENDA) 10 MG tablet Take 10 mg by mouth 2 (two) times daily.   Multiple Vitamins-Minerals (WOMENS MULTIVITAMIN PO) Take 1 tablet by mouth daily.    nitroGLYCERIN (NITROSTAT) 0.4 MG SL tablet Place 1 tablet (0.4 mg total) under the tongue every 5 (five) minutes as needed for chest pain.   rosuvastatin (CRESTOR) 40 MG tablet Take 40 mg by mouth daily.   sertraline (ZOLOFT) 50 MG tablet Take 50 mg by mouth daily.     Allergies:   Boniva [ibandronic acid], Isosulfan blue, and Morphine and related   Social History   Tobacco Use   Smoking status: Former    Packs/day: 1.00    Years: 37.00    Pack years: 37.00    Types: Cigarettes    Quit date: 11/22/1996    Years since quitting: 24.7   Smokeless tobacco: Never  Vaping Use   Vaping Use: Never used  Substance Use Topics   Alcohol use: Yes    Comment: 10/15/2018 "COUPLE DRINKS/YEAR   Drug use: Never    Family Hx: The patient's family history includes Other in an other family member; Other (age of onset: 31) in her mother.  Review of Systems  Gastrointestinal:  Negative for hematochezia.  Genitourinary:  Negative for hematuria.    EKGs/Labs/Other Test Reviewed:    EKG:  EKG is not ordered today.  The ekg ordered today demonstrates  n/a  Recent Labs: No results found for requested labs within last 8760 hours.   Recent Lipid Panel Lab Results  Component Value Date/Time   CHOL 148 02/15/2020 11:09 AM   TRIG 103 02/15/2020 11:09 AM   HDL 69 02/15/2020 11:09 AM   LDLCALC 60 02/15/2020 11:09 AM   Labs obtained through [place] [facility] - personally reviewed and interpreted: 02/21/2021: Total cholesterol 143, HDL 58, LDL 69, triglycerides 86, A1c 6.5, hemoglobin 13.4, creatinine 1.03, K+ 4.1, ALT 20, TSH 4.02   Risk Assessment/Calculations:          Physical Exam:    VS:  BP (!) 130/50   Pulse 64   Ht 5\' 1"  (1.549 m)   Wt 131  lb 3.2 oz (59.5 kg)   SpO2 98%   BMI 24.79 kg/m     Wt Readings from Last 3 Encounters:  08/23/21 131 lb 3.2 oz (59.5 kg)  06/13/21 127 lb 3.2 oz (57.7 kg)  05/24/21 127 lb (57.6 kg)    Constitutional:      Appearance: Healthy appearance. Not in distress.  Neck:     Vascular: JVD normal.  Pulmonary:     Effort: Pulmonary effort is normal.     Breath sounds: No wheezing. No rales.  Cardiovascular:     Normal rate. Regular rhythm. Normal S1. Normal S2.      Murmurs: There is no murmur.  Edema:    Peripheral edema absent.  Abdominal:     Palpations: Abdomen is soft.  Skin:    General: Skin is warm and dry.  Neurological:     Mental Status: Alert and oriented to person, place and time.     Cranial Nerves: Cranial nerves are intact.       ASSESSMENT & PLAN:   1. Coronary artery disease involving native coronary artery of native heart without angina pectoris 2. Shortness of breath She notes increasing shortness of breath with exertion as well as occasional symptoms of indigestion at night.  This is similar to her previous angina.  Given that her symptoms occur at night after eating late, acid reflux is also a potential contributor.  She had a recent low risk Myoview.  Her cardiac catheterization in 2019 demonstrated a patent vein graft to the OM.  The vein graft to the PDA was occluded  and the LIMA-LAD was atretic.  She underwent balloon angioplasty to the native RCA and stenting to the LAD.  Overall, her symptoms appear to be stable.  Her exam does not suggest volume overload.  I will obtain a BMET, BNP to r/o congestive heart failure.  I will also obtain an echocardiogram.  I have recommended that she start isosorbide mononitrate 15 mg daily.  I have also asked her to take over-the-counter famotidine as needed when she eats a meal late.  I will bring her back in follow-up with Dr. Burt [name] or me in 6 weeks.  She knows to return sooner if her symptoms should change or worsen over time.  3. Essential hypertension Blood pressure is well controlled.  She does show me several readings at home that are in the 61P and 50D systolic.  She is completely asymptomatic with this.  I have asked her to bring her machine with her next time so that we can check to make sure it is reading appropriately.  Continue amlodipine 5 mg daily.  4. Pure hypercholesterolemia LDL cholesterol at goal.  Continue rosuvastatin 40 mg daily.  5. PAD (peripheral artery disease) ([place]) Managed by Dr. Fletcher Anon.  6. Bilateral carotid artery stenosis Recent carotid US with 40-59% left ICA stenosis.  She will have follow-up again in 2023.        Dispo:  Return in about 6 weeks (around 10/04/2021) for Routine Follow Up, w/ Dr. Burt [name], or Richardson Dopp, PA-C.   Medication Adjustments/Labs and Tests Ordered: Current medicines are reviewed at length with the patient today.  Concerns regarding medicines are outlined above.  Tests Ordered: Orders Placed This Encounter  Procedures   Basic metabolic panel   Pro b natriuretic peptide (BNP)   ECHOCARDIOGRAM COMPLETE   Medication Changes: Meds ordered this encounter  Medications   isosorbide mononitrate (IMDUR) 30 MG 24 hr tablet    Sig:  Take 0.5 tablets (15 mg total) by mouth daily.    Dispense:  45 tablet    Refill:  3   Signed, Richardson Dopp, PA-C  08/23/2021 2:08 PM     [facility] [address], [place], [place]  [address] Phone: [phone]; Fax: [phone]

## 2021-08-23 ENCOUNTER — Ambulatory Visit: Payer: Medicare HMO | Admitting: Physician Assistant

## 2021-08-23 ENCOUNTER — Other Ambulatory Visit: Payer: Self-pay

## 2021-08-23 ENCOUNTER — Encounter: Payer: Self-pay | Admitting: Physician Assistant

## 2021-08-23 VITALS — BP 130/50 | HR 64 | Ht 61.0 in | Wt 131.2 lb

## 2021-08-23 DIAGNOSIS — I2510 Atherosclerotic heart disease of native coronary artery without angina pectoris: Secondary | ICD-10-CM

## 2021-08-23 DIAGNOSIS — I739 Peripheral vascular disease, unspecified: Secondary | ICD-10-CM | POA: Diagnosis not present

## 2021-08-23 DIAGNOSIS — I10 Essential (primary) hypertension: Secondary | ICD-10-CM

## 2021-08-23 DIAGNOSIS — E7800 Pure hypercholesterolemia, unspecified: Secondary | ICD-10-CM | POA: Diagnosis not present

## 2021-08-23 DIAGNOSIS — I6523 Occlusion and stenosis of bilateral carotid arteries: Secondary | ICD-10-CM

## 2021-08-23 DIAGNOSIS — R0602 Shortness of breath: Secondary | ICD-10-CM | POA: Diagnosis not present

## 2021-08-23 MED ORDER — ISOSORBIDE MONONITRATE ER 30 MG PO TB24: 15.0000 mg | ORAL_TABLET | Freq: Every day | ORAL | 3 refills | Status: DC

## 2021-08-23 NOTE — Patient Instructions (Signed)
Medication Instructions:  1.Start isosorbide mononitrate (Imdur), take one-half tablet (15mg ) by mouth daily. *If you need a refill on your cardiac medications before your next appointment, please call your pharmacy*   Lab Work: BMET and BNP today If you have labs (blood work) drawn today and your tests are completely normal, you will receive your results only by: [place] (if you have MyChart) OR A paper copy in the mail If you have any lab test that is abnormal or we need to change your treatment, we will call you to review the results.   Testing/Procedures: Your physician has requested that you have an echocardiogram. Echocardiography is a painless test that uses sound waves to create images of your heart. It provides your doctor with information about the size and shape of your heart and how well your heart's chambers and valves are working. This procedure takes approximately one hour. There are no restrictions for this procedure.    Follow-Up: At [facility], you and your health needs are our priority.  As part of our continuing mission to provide you with exceptional heart care, we have created designated Provider Care Teams.  These Care Teams include your primary Cardiologist (physician) and Advanced Practice Providers (APPs -  Physician Assistants and Nurse Practitioners) who all work together to provide you with the care you need, when you need it.    Your next appointment:   6 week(s)  The format for your next appointment:   In Person  Provider:   You may see [name], MD or one of the following Advanced Practice Providers on your designated Care Team:   Richardson Dopp, [place]

## 2021-08-24 DIAGNOSIS — Z1231 Encounter for screening mammogram for malignant neoplasm of breast: Secondary | ICD-10-CM | POA: Diagnosis not present

## 2021-08-24 LAB — BASIC METABOLIC PANEL
BUN/Creatinine Ratio: 23 (ref 12–28)
BUN: 21 mg/dL (ref 8–27)
CO2: 24 mmol/L (ref 20–29)
Calcium: 9.7 mg/dL (ref 8.7–10.3)
Chloride: 104 mmol/L (ref 96–106)
Creatinine, Ser: 0.9 mg/dL (ref 0.57–1.00)
Glucose: 107 mg/dL — ABNORMAL HIGH (ref 65–99)
Potassium: 3.9 mmol/L (ref 3.5–5.2)
Sodium: 145 mmol/L — ABNORMAL HIGH (ref 134–144)
eGFR: 65 mL/min/{1.73_m2} (ref >59)

## 2021-08-24 LAB — PRO B NATRIURETIC PEPTIDE: NT-Pro BNP: 808 pg/mL — ABNORMAL HIGH (ref 0–738)

## 2021-09-04 DIAGNOSIS — Z961 Presence of intraocular lens: Secondary | ICD-10-CM | POA: Diagnosis not present

## 2021-09-04 DIAGNOSIS — D3132 Benign neoplasm of left choroid: Secondary | ICD-10-CM | POA: Diagnosis not present

## 2021-09-04 DIAGNOSIS — H43812 Vitreous degeneration, left eye: Secondary | ICD-10-CM | POA: Diagnosis not present

## 2021-09-08 ENCOUNTER — Ambulatory Visit (HOSPITAL_COMMUNITY): Payer: Medicare HMO | Attending: Cardiovascular Disease

## 2021-09-08 ENCOUNTER — Other Ambulatory Visit: Payer: Self-pay

## 2021-09-08 DIAGNOSIS — I2510 Atherosclerotic heart disease of native coronary artery without angina pectoris: Secondary | ICD-10-CM | POA: Diagnosis not present

## 2021-09-08 DIAGNOSIS — R0602 Shortness of breath: Secondary | ICD-10-CM

## 2021-09-08 LAB — ECHOCARDIOGRAM COMPLETE
Area-P 1/2: 4.6 cm2
S' Lateral: 2.7 cm

## 2021-09-10 ENCOUNTER — Encounter: Payer: Self-pay | Admitting: Physician Assistant

## 2021-09-18 DIAGNOSIS — U071 COVID-19: Secondary | ICD-10-CM | POA: Diagnosis not present

## 2021-09-19 ENCOUNTER — Other Ambulatory Visit (HOSPITAL_BASED_OUTPATIENT_CLINIC_OR_DEPARTMENT_OTHER): Payer: Self-pay | Admitting: Cardiovascular Disease

## 2021-09-19 DIAGNOSIS — I739 Peripheral vascular disease, unspecified: Secondary | ICD-10-CM

## 2021-09-19 DIAGNOSIS — I779 Disorder of arteries and arterioles, unspecified: Secondary | ICD-10-CM

## 2021-09-20 ENCOUNTER — Other Ambulatory Visit: Payer: Self-pay | Admitting: *Deleted

## 2021-09-20 MED ORDER — ISOSORBIDE MONONITRATE ER 30 MG PO TB24: 15.0000 mg | ORAL_TABLET | Freq: Every day | ORAL | 3 refills | Status: DC

## 2021-09-28 ENCOUNTER — Telehealth: Payer: Self-pay | Admitting: Physician Assistant

## 2021-09-28 NOTE — Telephone Encounter (Signed)
Called pharmacy back verified with pharmacist that provider instructions are correct.  Pt is to take isosorbide mononitrate 15 mg PO QD.  No further concerns voiced.

## 2021-09-28 NOTE — Telephone Encounter (Signed)
   Pt c/o medication issue:  1. Name of Medication:  isosorbide mononitrate (IMDUR) 30 MG 24 hr tablet  2. How are you currently taking this medication (dosage and times per day)? Take 0.5 tablets (15 mg total) by mouth daily.  3. Are you having a reaction (difficulty breathing--STAT)?   4. What is your medication issue? Jen with [facility] calling wanted to clarify dosage of this meds, she said this prescription is ER and it is not recommendation to cut it in half. Ref# [id number]

## 2021-09-29 DIAGNOSIS — M810 Age-related osteoporosis without current pathological fracture: Secondary | ICD-10-CM | POA: Diagnosis not present

## 2021-10-03 ENCOUNTER — Encounter: Payer: Self-pay | Admitting: Physician Assistant

## 2021-10-03 ENCOUNTER — Other Ambulatory Visit: Payer: Self-pay

## 2021-10-03 ENCOUNTER — Ambulatory Visit: Payer: Medicare HMO | Admitting: Physician Assistant

## 2021-10-03 VITALS — BP 110/50 | HR 55 | Ht 61.0 in | Wt 125.6 lb

## 2021-10-03 DIAGNOSIS — I10 Essential (primary) hypertension: Secondary | ICD-10-CM

## 2021-10-03 DIAGNOSIS — I25119 Atherosclerotic heart disease of native coronary artery with unspecified angina pectoris: Secondary | ICD-10-CM | POA: Diagnosis not present

## 2021-10-03 DIAGNOSIS — Z8616 Personal history of COVID-19: Secondary | ICD-10-CM | POA: Insufficient documentation

## 2021-10-03 DIAGNOSIS — E7800 Pure hypercholesterolemia, unspecified: Secondary | ICD-10-CM | POA: Diagnosis not present

## 2021-10-03 DIAGNOSIS — I6523 Occlusion and stenosis of bilateral carotid arteries: Secondary | ICD-10-CM

## 2021-10-03 NOTE — Assessment & Plan Note (Signed)
Follow-up carotid ultrasound due in 06/2022.

## 2021-10-03 NOTE — Assessment & Plan Note (Signed)
Blood pressure is well controlled.  Continue amlodipine 5 mg daily, isosorbide mononitrate 15 mg daily.

## 2021-10-03 NOTE — Assessment & Plan Note (Signed)
History of CABG in 2011 and multiple PCI procedures.  Most recently, she underwent balloon angioplasty to the RCA stent and staged atherectomy/stenting to the LAD in 2019.  Myoview in June 2022 was low risk.  She was recently seen with symptoms of increasing shortness of breath as well as chest discomfort.  I placed her on low-dose isosorbide as well as famotidine.  She has not had any further chest discomfort.  She has been more short of breath related to COVID-19 but this has improved.  No further testing is indicated at this time.  Continue amlodipine 5 mg daily, clopidogrel 75 mg daily, isosorbide mononitrate 15 mg daily, rosuvastatin 40 mg daily.  Follow-up in 6 months.

## 2021-10-03 NOTE — Assessment & Plan Note (Signed)
She is still recovering.  She still has bouts of fatigue.  I have tried to reassure her that this should get better over time.

## 2021-10-03 NOTE — Progress Notes (Signed)
Cardiology Office Note:    Date:  10/03/2021   ID:  Erica [name], DOB 03/07/1941, MRN [id number]  PCP:  Mayra Neer, MD   [facility] Providers Cardiologist:  [name], MD     Referring MD: Mayra Neer, MD   Chief Complaint:  F/u for CAD, dyspnea    Patient Profile:   Erica [name] is a 80 y.o. female with:  Coronary artery disease  S/p CABG in 2011 S/p multiple PCIs S/p POBA to RCA due to ISR + staged atherectomy/stenting to LAD in 2019  Carotid artery dz Hyperlipidemia  Hypertension  Peripheral arterial disease  S/p bilat Iliac artery stenting in 2019  R RAS on prior CT >>med Rx  Melanoma s/p resection  Hypothyroidism  Bradycardia >> beta-blocker DC'd   History of Present Illness: Erica [name] was last seen 08/23/2021.  Prior to that, she had seen Dr. Burt Knack.  She had developed chest pain and mildly elevated troponins after melanoma resection in the hospital.  A follow-up Myoview was obtained and demonstrated no ischemia.  When I saw her in September, she had noted increasing shortness of breath with exertion and occasional symptoms of indigestion at night.  Her symptoms reminded of her previous angina.  I placed her on isosorbide and also asked her to start taking H2 RA to cover for acid reflux.  A follow-up echocardiogram demonstrated normal LV function and no wall motion abnormalities.  NT proBNP was obtained.  This was mildly elevated but well within the normal range for her age group.  She returns for follow-up.  She is here alone.  Since last seen, she was diagnosed with COVID-19.  She was treated with molnupiravir.  She continues to feel fatigued.  Her breathing is back to baseline.  She has not had any further changes in her breathing.  She has not had any further chest discomfort.  She has not had orthopnea, leg edema or syncope.  ASSESSMENT & PLAN:   Coronary artery disease involving native coronary artery with angina pectoris ([place]) History of CABG  in 2011 and multiple PCI procedures.  Most recently, she underwent balloon angioplasty to the RCA stent and staged atherectomy/stenting to the LAD in 2019.  Myoview in June 2022 was low risk.  She was recently seen with symptoms of increasing shortness of breath as well as chest discomfort.  I placed her on low-dose isosorbide as well as famotidine.  She has not had any further chest discomfort.  She has been more short of breath related to COVID-19 but this has improved.  No further testing is indicated at this time.  Continue amlodipine 5 mg daily, clopidogrel 75 mg daily, isosorbide mononitrate 15 mg daily, rosuvastatin 40 mg daily.  Follow-up in 6 months.  Essential hypertension Blood pressure is well controlled.  Continue amlodipine 5 mg daily, isosorbide mononitrate 15 mg daily.  Bilateral carotid artery stenosis Follow-up carotid ultrasound due in 06/2022.  Pure hypercholesterolemia LDL optimal.  Continue rosuvastatin 40 mg daily, ezetimibe 10 mg daily.  History of COVID-19 She is still recovering.  She still has bouts of fatigue.  I have tried to reassure her that this should get better over time.          Dispo:  Return in about 6 months (around 04/02/2022) for Routine Follow Up, w/ Dr. Burt Knack.    Prior CV studies: Echocardiogram 09/08/2021 EF 60-65, no RWMA, normal diastolic function, RVSP 50.9, trivial MR      CAROTID US 06/02/21 R 1-39;  L 40-59   ABIs 06/02/21 R 1.06; L 0.99   GATED SPECT MYO PERF W/LEXISCAN STRESS 1D 05/24/2021 No ischemia, EF 65; low risk   CORONARY ATHERECTOMY 07/16/2018 Narrative Successful PCI of the LAD using orbital atherectomy and stenting with a 2.75x20 mm Synergy DES     LEFT HEART CATH  06/04/2018 1. Severe multivessel CAD with total occlusion of the LCx, severe in-stent restenosis in the RCA, and moderately severe proximal LAD stenosis 2. S/P CABG with continued patency of the SVG-OM, total occlusion of the SVG-PDA/PLA and atresia of the  LIMA-LAD 3. Mild segmental LV contraction abnormality with preserved LVEF of 55% 4. FFR of the proximal LAD = 0.77 5. FFR of the mid-RCA = 0.75, treated successfully with cutting balloon angioplasty Recommend: Staged PCI of the LAD - will require orbital atherectomy and stenting      Past Medical History:  Diagnosis Date   Anxiety    Chronic mid back pain    Coronary artery disease    status post multiple percutaneous interventions // Echocardiogram 10/22: EF 60-65, no RWMA, normal RVSF, RVSP 27.6, trivial MR   Depression    History of kidney stones 1990s X 1; 2016    Hyperlipidemia    Hypertension    Hypothyroidism    Myocardial infarction ([place]) 01/2010   Osteoarthritis    "hands, back" (10/15/2018)   Current Medications: Current Meds  Medication Sig   amLODipine (NORVASC) 5 MG tablet Take 5 mg by mouth daily.   Calcium Carb-Cholecalciferol (CALCIUM + VITAMIN D3 PO) Take 1 tablet by mouth daily.    Cholecalciferol (VITAMIN D) 50 MCG (2000 UT) tablet Take 1 tablet by mouth daily.   clopidogrel (PLAVIX) 75 MG tablet Take 1 tablet (75 mg total) by mouth daily.   denosumab (PROLIA) 60 MG/ML SOSY injection Inject 1 mL into the skin every 6 (six) months.   doxepin (SINEQUAN) 10 MG capsule Take by mouth.   ezetimibe (ZETIA) 10 MG tablet TAKE ONE TABLET BY MOUTH DAILY   isosorbide mononitrate (IMDUR) 30 MG 24 hr tablet Take 0.5 tablets (15 mg total) by mouth daily.   levothyroxine (SYNTHROID, LEVOTHROID) 100 MCG tablet Take 100 mcg by mouth daily before breakfast.    memantine (NAMENDA) 10 MG tablet Take 10 mg by mouth 2 (two) times daily.   Multiple Vitamins-Minerals (WOMENS MULTIVITAMIN PO) Take 1 tablet by mouth daily.    nitroGLYCERIN (NITROSTAT) 0.4 MG SL tablet Place 1 tablet (0.4 mg total) under the tongue every 5 (five) minutes as needed for chest pain.   rosuvastatin (CRESTOR) 40 MG tablet Take 40 mg by mouth daily.   sertraline (ZOLOFT) 50 MG tablet Take 50 mg by mouth  daily.     Allergies:   Boniva [ibandronic acid], Isosulfan blue, and Morphine and related   Social History   Tobacco Use   Smoking status: Former    Packs/day: 1.00    Years: 37.00    Pack years: 37.00    Types: Cigarettes    Quit date: 11/22/1996    Years since quitting: 24.8   Smokeless tobacco: Never  Vaping Use   Vaping Use: Never used  Substance Use Topics   Alcohol use: Yes    Comment: 10/15/2018 "COUPLE DRINKS/YEAR   Drug use: Never    Family Hx: The patient's family history includes Other in an other family member; Other (age of onset: 3) in her mother.  Review of Systems  Constitutional: Positive for malaise/fatigue (since dx with COVID).  EKGs/Labs/Other Test Reviewed:    EKG:  EKG is not ordered today.  The ekg ordered today demonstrates n/a  Recent Labs: 08/23/2021: BUN 21; Creatinine, Ser 0.90; NT-Pro BNP 808; Potassium 3.9; Sodium 145   Recent Lipid Panel Lab Results  Component Value Date/Time   CHOL 148 02/15/2020 11:09 AM   TRIG 103 02/15/2020 11:09 AM   HDL 69 02/15/2020 11:09 AM   LDLCALC 60 02/15/2020 11:09 AM     Risk Assessment/Calculations:          Physical Exam:    VS:  BP (!) 110/50   Pulse (!) 55   Ht 5\' 1"  (1.549 m)   Wt 125 lb 9.6 oz (57 kg)   SpO2 97%   BMI 23.73 kg/m     Wt Readings from Last 3 Encounters:  10/03/21 125 lb 9.6 oz (57 kg)  08/23/21 131 lb 3.2 oz (59.5 kg)  06/13/21 127 lb 3.2 oz (57.7 kg)    Constitutional:      Appearance: Healthy appearance. Not in distress.  Neck:     Vascular: No JVR. JVD normal.  Pulmonary:     Effort: Pulmonary effort is normal.     Breath sounds: No wheezing. No rales.  Cardiovascular:     Normal rate. Regular rhythm. Normal S1. Normal S2.      Murmurs: There is no murmur.  Edema:    Peripheral edema absent.  Abdominal:     Palpations: Abdomen is soft.  Skin:    General: Skin is warm and dry.  Neurological:     Mental Status: Alert and oriented to person, place and  time.     Cranial Nerves: Cranial nerves are intact.     Medication Adjustments/Labs and Tests Ordered: Current medicines are reviewed at length with the patient today.  Concerns regarding medicines are outlined above.  Tests Ordered: No orders of the defined types were placed in this encounter.  Medication Changes: No orders of the defined types were placed in this encounter.  Signed, Richardson Dopp, PA-C  10/03/2021 2:55 PM    [address], [place], [place]  [address] Phone: [phone]; Fax: [phone]

## 2021-10-03 NOTE — Assessment & Plan Note (Signed)
LDL optimal.  Continue rosuvastatin 40 mg daily, ezetimibe 10 mg daily.

## 2021-10-03 NOTE — Patient Instructions (Signed)
Medication Instructions:   Your physician recommends that you continue on your current medications as directed. Please refer to the Current Medication list given to you today.  *If you need a refill on your cardiac medications before your next appointment, please call your pharmacy*   Lab Work:  -NONE-    If you have labs (blood work) drawn today and your tests are completely normal, you will receive your results only by: [place] (if you have MyChart) OR A paper copy in the mail If you have any lab test that is abnormal or we need to change your treatment, we will call you to review the results.   Testing/Procedures:  -NONE   Follow-Up: At [facility], you and your health needs are our priority.  As part of our continuing mission to provide you with exceptional heart care, we have created designated Provider Care Teams.  These Care Teams include your primary Cardiologist (physician) and Advanced Practice Providers (APPs -  Physician Assistants and Nurse Practitioners) who all work together to provide you with the care you need, when you need it.  We recommend signing up for the patient portal called "MyChart".  Sign up information is provided on this After Visit Summary.  MyChart is used to connect with patients for Virtual Visits (Telemedicine).  Patients are able to view lab/test results, encounter notes, upcoming appointments, etc.  Non-urgent messages can be sent to your provider as well.   To learn more about what you can do with MyChart, go to [email or web address].    Your next appointment:   6 month(s)  The format for your next appointment:   In Person  Provider:   [name], MD   Other Instructions  Your physician wants you to follow-up in: 6 months with Dr. Burt Knack.  You will receive a reminder letter in the mail two months in advance. If you don't receive a letter, please call our office to schedule the follow-up appointment.

## 2021-10-17 DIAGNOSIS — Z85820 Personal history of malignant melanoma of skin: Secondary | ICD-10-CM | POA: Diagnosis not present

## 2021-10-17 DIAGNOSIS — C4339 Malignant melanoma of other parts of face: Secondary | ICD-10-CM | POA: Diagnosis not present

## 2021-10-17 DIAGNOSIS — D0339 Melanoma in situ of other parts of face: Secondary | ICD-10-CM | POA: Diagnosis not present

## 2021-10-20 DIAGNOSIS — R7301 Impaired fasting glucose: Secondary | ICD-10-CM | POA: Diagnosis not present

## 2021-10-20 DIAGNOSIS — E782 Mixed hyperlipidemia: Secondary | ICD-10-CM | POA: Diagnosis not present

## 2021-10-20 DIAGNOSIS — H3530 Unspecified macular degeneration: Secondary | ICD-10-CM | POA: Diagnosis not present

## 2021-10-20 DIAGNOSIS — I2510 Atherosclerotic heart disease of native coronary artery without angina pectoris: Secondary | ICD-10-CM | POA: Diagnosis not present

## 2021-10-20 DIAGNOSIS — Z0000 Encounter for general adult medical examination without abnormal findings: Secondary | ICD-10-CM | POA: Diagnosis not present

## 2021-10-20 DIAGNOSIS — I119 Hypertensive heart disease without heart failure: Secondary | ICD-10-CM | POA: Diagnosis not present

## 2021-10-20 DIAGNOSIS — I700 Atherosclerosis of aorta: Secondary | ICD-10-CM | POA: Diagnosis not present

## 2021-10-20 DIAGNOSIS — F322 Major depressive disorder, single episode, severe without psychotic features: Secondary | ICD-10-CM | POA: Diagnosis not present

## 2021-10-20 DIAGNOSIS — Z23 Encounter for immunization: Secondary | ICD-10-CM | POA: Diagnosis not present

## 2021-10-20 DIAGNOSIS — I739 Peripheral vascular disease, unspecified: Secondary | ICD-10-CM | POA: Diagnosis not present

## 2021-10-20 DIAGNOSIS — E039 Hypothyroidism, unspecified: Secondary | ICD-10-CM | POA: Diagnosis not present

## 2021-11-01 DIAGNOSIS — M8589 Other specified disorders of bone density and structure, multiple sites: Secondary | ICD-10-CM | POA: Diagnosis not present

## 2021-11-01 DIAGNOSIS — Z780 Asymptomatic menopausal state: Secondary | ICD-10-CM | POA: Diagnosis not present

## 2021-11-14 DIAGNOSIS — C779 Secondary and unspecified malignant neoplasm of lymph node, unspecified: Secondary | ICD-10-CM | POA: Diagnosis not present

## 2021-11-14 DIAGNOSIS — C4339 Malignant melanoma of other parts of face: Secondary | ICD-10-CM | POA: Diagnosis not present

## 2021-11-14 DIAGNOSIS — C439 Malignant melanoma of skin, unspecified: Secondary | ICD-10-CM | POA: Diagnosis not present

## 2021-12-12 DIAGNOSIS — C779 Secondary and unspecified malignant neoplasm of lymph node, unspecified: Secondary | ICD-10-CM | POA: Diagnosis not present

## 2021-12-12 DIAGNOSIS — C4339 Malignant melanoma of other parts of face: Secondary | ICD-10-CM | POA: Diagnosis not present

## 2021-12-12 DIAGNOSIS — C439 Malignant melanoma of skin, unspecified: Secondary | ICD-10-CM | POA: Diagnosis not present

## 2022-01-02 DIAGNOSIS — C792 Secondary malignant neoplasm of skin: Secondary | ICD-10-CM | POA: Diagnosis not present

## 2022-01-02 DIAGNOSIS — C770 Secondary and unspecified malignant neoplasm of lymph nodes of head, face and neck: Secondary | ICD-10-CM | POA: Diagnosis not present

## 2022-01-02 DIAGNOSIS — C439 Malignant melanoma of skin, unspecified: Secondary | ICD-10-CM | POA: Diagnosis not present

## 2022-01-02 DIAGNOSIS — C4339 Malignant melanoma of other parts of face: Secondary | ICD-10-CM | POA: Diagnosis not present

## 2022-01-02 DIAGNOSIS — C779 Secondary and unspecified malignant neoplasm of lymph node, unspecified: Secondary | ICD-10-CM | POA: Diagnosis not present

## 2022-02-16 DIAGNOSIS — E039 Hypothyroidism, unspecified: Secondary | ICD-10-CM | POA: Diagnosis not present

## 2022-02-16 DIAGNOSIS — E782 Mixed hyperlipidemia: Secondary | ICD-10-CM | POA: Diagnosis not present

## 2022-02-16 DIAGNOSIS — I209 Angina pectoris, unspecified: Secondary | ICD-10-CM | POA: Diagnosis not present

## 2022-02-16 DIAGNOSIS — R8290 Unspecified abnormal findings in urine: Secondary | ICD-10-CM | POA: Diagnosis not present

## 2022-02-16 DIAGNOSIS — L659 Nonscarring hair loss, unspecified: Secondary | ICD-10-CM | POA: Diagnosis not present

## 2022-02-16 DIAGNOSIS — R7301 Impaired fasting glucose: Secondary | ICD-10-CM | POA: Diagnosis not present

## 2022-02-16 DIAGNOSIS — I700 Atherosclerosis of aorta: Secondary | ICD-10-CM | POA: Diagnosis not present

## 2022-02-16 DIAGNOSIS — R296 Repeated falls: Secondary | ICD-10-CM | POA: Diagnosis not present

## 2022-02-16 DIAGNOSIS — I119 Hypertensive heart disease without heart failure: Secondary | ICD-10-CM | POA: Diagnosis not present

## 2022-02-16 DIAGNOSIS — M1990 Unspecified osteoarthritis, unspecified site: Secondary | ICD-10-CM | POA: Diagnosis not present

## 2022-02-16 DIAGNOSIS — R413 Other amnesia: Secondary | ICD-10-CM | POA: Diagnosis not present

## 2022-03-08 DIAGNOSIS — R8290 Unspecified abnormal findings in urine: Secondary | ICD-10-CM | POA: Diagnosis not present

## 2022-04-25 DIAGNOSIS — M79641 Pain in right hand: Secondary | ICD-10-CM | POA: Diagnosis not present

## 2022-04-25 DIAGNOSIS — M65341 Trigger finger, right ring finger: Secondary | ICD-10-CM | POA: Diagnosis not present

## 2022-04-25 DIAGNOSIS — M79642 Pain in left hand: Secondary | ICD-10-CM | POA: Diagnosis not present

## 2022-04-25 DIAGNOSIS — M13841 Other specified arthritis, right hand: Secondary | ICD-10-CM | POA: Diagnosis not present

## 2022-04-26 DIAGNOSIS — L820 Inflamed seborrheic keratosis: Secondary | ICD-10-CM | POA: Diagnosis not present

## 2022-04-26 DIAGNOSIS — L821 Other seborrheic keratosis: Secondary | ICD-10-CM | POA: Diagnosis not present

## 2022-04-26 DIAGNOSIS — L814 Other melanin hyperpigmentation: Secondary | ICD-10-CM | POA: Diagnosis not present

## 2022-04-26 DIAGNOSIS — Z85820 Personal history of malignant melanoma of skin: Secondary | ICD-10-CM | POA: Diagnosis not present

## 2022-04-26 DIAGNOSIS — D692 Other nonthrombocytopenic purpura: Secondary | ICD-10-CM | POA: Diagnosis not present

## 2022-04-26 DIAGNOSIS — D1801 Hemangioma of skin and subcutaneous tissue: Secondary | ICD-10-CM | POA: Diagnosis not present

## 2022-04-26 DIAGNOSIS — D225 Melanocytic nevi of trunk: Secondary | ICD-10-CM | POA: Diagnosis not present

## 2022-04-26 DIAGNOSIS — B078 Other viral warts: Secondary | ICD-10-CM | POA: Diagnosis not present

## 2022-05-16 ENCOUNTER — Encounter: Payer: Self-pay | Admitting: Cardiovascular Disease

## 2022-05-22 ENCOUNTER — Telehealth: Payer: Self-pay | Admitting: *Deleted

## 2022-05-22 NOTE — Telephone Encounter (Signed)
Our office received a clearance request from the requesting office> however, the request does not specify what procedure the pt will be having done. I will fax back to Dr. Junita Push office to please fax over a clearance request that states what the procedure is to be done, if any teeth are being extracted then we will need to know how many teeth are to be extracted as well as if they are surgical or simple extractions. What type of anesthesia, meds to be held if any.   Please fax over a clearance form with needed information. The cardiologist will not provide a blanket type clearance.

## 2022-05-25 NOTE — Telephone Encounter (Signed)
   Pre-operative Risk Assessment    Patient Name: Erica [name]  DOB: [birth year] MRN: [id number]     Request for Surgical Clearance    Procedure:   10 TEETH EXTRACTED (SIMPLE); ALONG WITH 5 IMMEDIATE IMPLANTS ON THE LOWER ARCH   Date of Surgery:  Clearance TBD                                 Surgeon:  DR. Angela Burke, DMD Surgeon's Group or Practice Name:  [facility] Phone number:  [phone] Fax number:  [phone]   Type of Clearance Requested:   - Medical  - Pharmacy:  Hold Clopidogrel (Plavix)     Type of Anesthesia:   IV SEDATION   Additional requests/questions:    Erica [name]   05/25/2022, 10:07 AM

## 2022-05-29 ENCOUNTER — Telehealth: Payer: Self-pay

## 2022-05-31 ENCOUNTER — Ambulatory Visit (INDEPENDENT_AMBULATORY_CARE_PROVIDER_SITE_OTHER): Payer: Medicare HMO | Admitting: Physician Assistant

## 2022-05-31 DIAGNOSIS — Z01810 Encounter for preprocedural cardiovascular examination: Secondary | ICD-10-CM

## 2022-05-31 NOTE — Progress Notes (Signed)
Virtual Visit via Telephone Note   Because of Erica [name] co-morbid illnesses, she is at least at moderate risk for complications without adequate follow up.  This format is felt to be most appropriate for this patient at this time.  The patient did not have access to video technology/had technical difficulties with video requiring transitioning to audio format only (telephone).  All issues noted in this document were discussed and addressed.  No physical exam could be performed with this format.  Please refer to the patient's chart for her consent to telehealth for [facility].  Evaluation Performed:  Preoperative cardiovascular risk assessment _____________   Date:  05/31/2022   Patient ID:  Erica [name], DOB 1941-05-18, MRN [id number] Patient Location:  Home Provider location:   Office  Primary Care Provider:  Mayra Neer, MD Primary Cardiologist:  [name], MD  Chief Complaint / Patient Profile   81 y.o. y/o female with a h/o CAD (status post multiple PCI's), carotid artery disease, hyperlipidemia, hypertension, PAD, hypothyroidism who is pending 10 teeth extracted with 5 immediate implants on the lower arch and presents today for telephonic preoperative cardiovascular risk assessment.   Past Medical History    Past Medical History:  Diagnosis Date   Anxiety    Chronic mid back pain    Coronary artery disease    status post multiple percutaneous interventions // Echocardiogram 10/22: EF 60-65, no RWMA, normal RVSF, RVSP 27.6, trivial MR   Depression    History of kidney stones 1990s X 1; 2016    Hyperlipidemia    Hypertension    Hypothyroidism    Myocardial infarction ([place]) 01/2010   Osteoarthritis    "hands, back" (10/15/2018)   Past Surgical History:  Procedure Laterality Date   ABDOMINAL AORTOGRAM W/LOWER EXTREMITY  10/15/2018   ABDOMINAL AORTOGRAM W/LOWER EXTREMITY N/A 10/15/2018   Procedure: ABDOMINAL AORTOGRAM W/LOWER EXTREMITY;  Surgeon:  Wellington Hampshire, MD;  Location: [place] [facility];  Service: Cardiovascular;  Laterality: N/A;   CARDIAC CATHETERIZATION  12-2008   CATARACT EXTRACTION W/ INTRAOCULAR LENS IMPLANT Bilateral    CORONARY ANGIOPLASTY WITH STENT PLACEMENT  2006    right coronary artery with drug-eluting stent   CORONARY ANGIOPLASTY WITH STENT PLACEMENT  1998    bare-metal stent to the right coronary artery   CORONARY ANGIOPLASTY WITH STENT PLACEMENT  2008   drug-eluting stent placed in the left circumflex   CORONARY ARTERY BYPASS GRAFT  01-25-10   CABG X4   CORONARY ATHERECTOMY N/A 07/16/2018   Procedure: CORONARY ATHERECTOMY;  Surgeon: [name], MD;  Location: [place] [facility];  Service: Cardiovascular;  Laterality: N/A;   CORONARY BALLOON ANGIOPLASTY N/A 06/04/2018   Procedure: CORONARY BALLOON ANGIOPLASTY;  Surgeon: [name], MD;  Location: [place] [facility];  Service: Cardiovascular;  Laterality: N/A;   CYSTOSCOPY WITH RETROGRADE PYELOGRAM, URETEROSCOPY AND STENT PLACEMENT Right 10/18/2015   Procedure: CYSTOSCOPY WITH RETROGRADE PYELOGRAM,  AND STENT PLACEMENT;  Surgeon: Festus Aloe, MD;  Location: [facility];  Service: Urology;  Laterality: Right;   CYSTOSCOPY/URETEROSCOPY/HOLMIUM LASER/STENT PLACEMENT Right 11/29/2015   Procedure: RIGHT URETEROSCOPY/RIGHT RETROGRADE PYELOGRAM/ RIGHT STENT REPLACEMENT;  Surgeon: Festus Aloe, MD;  Location: [facility];  Service: Urology;  Laterality: Right;   FOOT SURGERY Bilateral 2008-2016   "bones shortened prox to  great toes"   HAMMER TOE SURGERY Right 2008   2nd and 3rd digits   INTRAVASCULAR PRESSURE WIRE/FFR STUDY N/A 06/04/2018   Procedure: INTRAVASCULAR PRESSURE WIRE/FFR STUDY;  Surgeon: [name], MD;  Location: [place] [facility];  Service: Cardiovascular;  Laterality: N/A;   LEFT HEART CATH AND CORS/GRAFTS ANGIOGRAPHY N/A 06/04/2018   Procedure: LEFT HEART CATH AND CORS/GRAFTS ANGIOGRAPHY;  Surgeon: [name], MD;   Location: [place] [facility];  Service: Cardiovascular;  Laterality: N/A;   PERIPHERAL VASCULAR INTERVENTION Bilateral 10/15/2018   PERIPHERAL VASCULAR INTERVENTION Bilateral 10/15/2018   Procedure: PERIPHERAL VASCULAR INTERVENTION;  Surgeon: Wellington Hampshire, MD;  Location: [place] [facility];  Service: Cardiovascular;  Laterality: Bilateral;   ULTRASOUND GUIDANCE FOR VASCULAR ACCESS  06/04/2018   Procedure: Ultrasound Guidance For Vascular Access;  Surgeon: [name], MD;  Location: [place] [facility];  Service: Cardiovascular;;    Allergies  Allergies  Allergen Reactions   Boniva [Ibandronic Acid]    Isosulfan Blue    Morphine And Related Nausea And Vomiting    History of Present Illness    Erica [name] is a 81 y.o. female who presents via video conferencing for a telehealth visit today.  Pt was last seen in cardiology clinic on 10/03/2021 by Richardson Dopp, PA-C.  At that time Erica [name] was doing well .  The patient is now pending procedure as outlined above. Since her last visit, she has been doing well without any cardiovascular complaints.  She walks 8,000 steps a day.  She exceeds the 4 METS requirement for clearance.  According to the DASI she is at 5.07 METS.  Per Dr. Burt Knack okay to hold Plavix x5 days prior to multiple dental extractions.  Please resume medication when medically safe to do so.  Reports no shortness of breath nor dyspnea on exertion. Reports no chest pain, pressure, or tightness. No edema, orthopnea, PND. Reports no palpitations.     Home Medications    Prior to Admission medications   Medication Sig Start Date End Date Taking? Authorizing Provider  amLODipine (NORVASC) 5 MG tablet Take 5 mg by mouth daily.    [provider]  Biotin 5 MG CAPS Take 5 mg by mouth daily.    [provider]  Calcium Carb-Cholecalciferol (CALCIUM + VITAMIN D3 PO) Take 1 tablet by mouth daily.     [provider]  Cholecalciferol (VITAMIN D)  50 MCG (2000 UT) tablet Take 1 tablet by mouth daily.    [provider]  clopidogrel (PLAVIX) 75 MG tablet Take 1 tablet (75 mg total) by mouth daily. 05/04/21   [name], MD  denosumab (PROLIA) 60 MG/ML SOSY injection Inject 1 mL into the skin every 6 (six) months. Patient not taking: Reported on 05/29/2022    [provider]  doxepin (SINEQUAN) 10 MG capsule Take by mouth. Patient not taking: Reported on 05/29/2022    [provider]  ezetimibe (ZETIA) 10 MG tablet TAKE ONE TABLET BY MOUTH DAILY 08/03/20   [name], MD  isosorbide mononitrate (IMDUR) 30 MG 24 hr tablet Take 0.5 tablets (15 mg total) by mouth daily. 09/20/21   Richardson Dopp T, PA-C  levothyroxine (SYNTHROID, LEVOTHROID) 100 MCG tablet Take 100 mcg by mouth daily before breakfast.  08/01/15   [provider]  memantine (NAMENDA) 10 MG tablet Take 10 mg by mouth 2 (two) times daily. Patient not taking: Reported on 05/29/2022 05/02/20   [provider]  Multiple Vitamins-Minerals (WOMENS MULTIVITAMIN PO) Take 1 tablet by mouth daily.     [provider]  nitroGLYCERIN (NITROSTAT) 0.4 MG SL tablet Place 1 tablet (0.4 mg total) under the  tongue every 5 (five) minutes as needed for chest pain. 05/04/21   [name], MD  rosuvastatin (CRESTOR) 40 MG tablet Take 40 mg by mouth daily.    [provider]  sertraline (ZOLOFT) 50 MG tablet Take 50 mg by mouth daily.  03/17/18   [provider]    Physical Exam    Vital Signs:  Erica [name] does not have vital signs available for review today.  Given telephonic nature of communication, physical exam is limited. AAOx3. NAD. Normal affect.  Speech and respirations are unlabored.  Accessory Clinical Findings    None  Assessment & Plan    1.  Preoperative Cardiovascular Risk Assessment:  Ms. Gladue perioperative risk of a major cardiac event is 0.9% according to the Revised Cardiac Risk Index  (RCRI).  Therefore, she is at low risk for perioperative complications.   Her functional capacity is good at 5.07 METs according to the [place] Activity Status Index (DASI). Recommendations: According to ACC/AHA guidelines, no further cardiovascular testing needed.  The patient may proceed to surgery at acceptable risk.   Antiplatelet and/or Anticoagulation Recommendations: Clopidogrel (Plavix) can be held for 5 days prior to her surgery and resumed as soon as possible post op.   A copy of this note will be routed to requesting surgeon.  Time:   Today, I have spent 15 minutes with the patient with telehealth technology discussing medical history, symptoms, and management plan.     Elgie Collard, PA-C  05/31/2022, 10:05 AM

## 2022-06-07 ENCOUNTER — Ambulatory Visit (HOSPITAL_BASED_OUTPATIENT_CLINIC_OR_DEPARTMENT_OTHER)
Admission: RE | Admit: 2022-06-07 | Discharge: 2022-06-07 | Disposition: A | Discharge status: Home / Self Care | Payer: Medicare HMO | Source: Ambulatory Visit | Attending: Cardiology | Admitting: Cardiology

## 2022-06-07 ENCOUNTER — Ambulatory Visit (HOSPITAL_COMMUNITY)
Admission: RE | Admit: 2022-06-07 | Discharge: 2022-06-07 | Disposition: A | Discharge status: Home / Self Care | Payer: Medicare HMO | Source: Ambulatory Visit | Attending: Cardiology | Admitting: Cardiology

## 2022-06-07 DIAGNOSIS — Z95828 Presence of other vascular implants and grafts: Secondary | ICD-10-CM | POA: Diagnosis not present

## 2022-06-07 DIAGNOSIS — I779 Disorder of arteries and arterioles, unspecified: Secondary | ICD-10-CM | POA: Insufficient documentation

## 2022-06-07 DIAGNOSIS — I739 Peripheral vascular disease, unspecified: Secondary | ICD-10-CM | POA: Diagnosis not present

## 2022-06-07 DIAGNOSIS — I6523 Occlusion and stenosis of bilateral carotid arteries: Secondary | ICD-10-CM | POA: Diagnosis not present

## 2022-06-11 ENCOUNTER — Other Ambulatory Visit: Payer: Self-pay | Admitting: *Deleted

## 2022-06-11 DIAGNOSIS — I739 Peripheral vascular disease, unspecified: Secondary | ICD-10-CM

## 2022-06-15 DIAGNOSIS — Z08 Encounter for follow-up examination after completed treatment for malignant neoplasm: Secondary | ICD-10-CM | POA: Diagnosis not present

## 2022-06-15 DIAGNOSIS — Z85820 Personal history of malignant melanoma of skin: Secondary | ICD-10-CM | POA: Diagnosis not present

## 2022-06-15 DIAGNOSIS — Z888 Allergy status to other drugs, medicaments and biological substances status: Secondary | ICD-10-CM | POA: Diagnosis not present

## 2022-06-15 DIAGNOSIS — L989 Disorder of the skin and subcutaneous tissue, unspecified: Secondary | ICD-10-CM | POA: Diagnosis not present

## 2022-06-15 DIAGNOSIS — Z79899 Other long term (current) drug therapy: Secondary | ICD-10-CM | POA: Diagnosis not present

## 2022-06-15 DIAGNOSIS — Z7902 Long term (current) use of antithrombotics/antiplatelets: Secondary | ICD-10-CM | POA: Diagnosis not present

## 2022-06-15 DIAGNOSIS — Z79890 Hormone replacement therapy (postmenopausal): Secondary | ICD-10-CM | POA: Diagnosis not present

## 2022-06-15 DIAGNOSIS — Z885 Allergy status to narcotic agent status: Secondary | ICD-10-CM | POA: Diagnosis not present

## 2022-06-15 DIAGNOSIS — C4339 Malignant melanoma of other parts of face: Secondary | ICD-10-CM | POA: Diagnosis not present

## 2022-06-19 ENCOUNTER — Encounter: Payer: Self-pay | Admitting: *Deleted

## 2022-06-25 DIAGNOSIS — E782 Mixed hyperlipidemia: Secondary | ICD-10-CM | POA: Diagnosis not present

## 2022-06-25 DIAGNOSIS — R413 Other amnesia: Secondary | ICD-10-CM | POA: Diagnosis not present

## 2022-06-25 DIAGNOSIS — G5600 Carpal tunnel syndrome, unspecified upper limb: Secondary | ICD-10-CM | POA: Diagnosis not present

## 2022-06-25 DIAGNOSIS — E1169 Type 2 diabetes mellitus with other specified complication: Secondary | ICD-10-CM | POA: Diagnosis not present

## 2022-06-25 DIAGNOSIS — M8580 Other specified disorders of bone density and structure, unspecified site: Secondary | ICD-10-CM | POA: Diagnosis not present

## 2022-06-25 DIAGNOSIS — N183 Chronic kidney disease, stage 3 unspecified: Secondary | ICD-10-CM | POA: Diagnosis not present

## 2022-06-25 DIAGNOSIS — I119 Hypertensive heart disease without heart failure: Secondary | ICD-10-CM | POA: Diagnosis not present

## 2022-06-25 DIAGNOSIS — E039 Hypothyroidism, unspecified: Secondary | ICD-10-CM | POA: Diagnosis not present

## 2022-08-19 ENCOUNTER — Other Ambulatory Visit: Payer: Self-pay | Admitting: Physician Assistant

## 2022-08-22 ENCOUNTER — Other Ambulatory Visit (HOSPITAL_COMMUNITY): Payer: Self-pay | Admitting: Cardiovascular Disease

## 2022-08-22 DIAGNOSIS — I779 Disorder of arteries and arterioles, unspecified: Secondary | ICD-10-CM

## 2022-08-30 ENCOUNTER — Encounter: Payer: Self-pay | Admitting: Cardiovascular Disease

## 2022-08-30 ENCOUNTER — Ambulatory Visit: Payer: Medicare HMO | Attending: Cardiovascular Disease | Admitting: Cardiovascular Disease

## 2022-08-30 VITALS — BP 158/70 | HR 49 | Ht 61.0 in | Wt 125.0 lb

## 2022-08-30 DIAGNOSIS — I10 Essential (primary) hypertension: Secondary | ICD-10-CM | POA: Diagnosis not present

## 2022-08-30 DIAGNOSIS — I739 Peripheral vascular disease, unspecified: Secondary | ICD-10-CM

## 2022-08-30 DIAGNOSIS — E782 Mixed hyperlipidemia: Secondary | ICD-10-CM

## 2022-08-30 DIAGNOSIS — I25119 Atherosclerotic heart disease of native coronary artery with unspecified angina pectoris: Secondary | ICD-10-CM

## 2022-08-30 DIAGNOSIS — I6523 Occlusion and stenosis of bilateral carotid arteries: Secondary | ICD-10-CM | POA: Diagnosis not present

## 2022-08-30 DIAGNOSIS — Z1231 Encounter for screening mammogram for malignant neoplasm of breast: Secondary | ICD-10-CM | POA: Diagnosis not present

## 2022-08-30 NOTE — Patient Instructions (Signed)
Medication Instructions:  Your physician recommends that you continue on your current medications as directed. Please refer to the Current Medication list given to you today.  *If you need a refill on your cardiac medications before your next appointment, please call your pharmacy*   Lab Work: NONE If you have labs (blood work) drawn today and your tests are completely normal, you will receive your results only by: MyChart Message (if you have MyChart) OR A paper copy in the mail If you have any lab test that is abnormal or we need to change your treatment, we will call you to review the results.   Testing/Procedures: NONE   Follow-Up: At [facility], you and your health needs are our priority.  As part of our continuing mission to provide you with exceptional heart care, we have created designated Provider Care Teams.  These Care Teams include your primary Cardiologist (physician) and Advanced Practice Providers (APPs -  Physician Assistants and Nurse Practitioners) who all work together to provide you with the care you need, when you need it.  We recommend signing up for the patient portal called "MyChart".  Sign up information is provided on this After Visit Summary.  MyChart is used to connect with patients for Virtual Visits (Telemedicine).  Patients are able to view lab/test results, encounter notes, upcoming appointments, etc.  Non-urgent messages can be sent to your provider as well.   To learn more about what you can do with MyChart, go to [email or web address].    Your next appointment:   1 year(s)  The format for your next appointment:   In Person  Provider:   Michael Cooper, MD  or APP     Important Information About Sugar       
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
## 2022-09-10 DIAGNOSIS — H43812 Vitreous degeneration, left eye: Secondary | ICD-10-CM | POA: Diagnosis not present

## 2022-09-10 DIAGNOSIS — H353131 Nonexudative age-related macular degeneration, bilateral, early dry stage: Secondary | ICD-10-CM | POA: Diagnosis not present

## 2022-09-10 DIAGNOSIS — D3132 Benign neoplasm of left choroid: Secondary | ICD-10-CM | POA: Diagnosis not present

## 2022-09-10 DIAGNOSIS — Z961 Presence of intraocular lens: Secondary | ICD-10-CM | POA: Diagnosis not present

## 2022-09-13 DIAGNOSIS — R300 Dysuria: Secondary | ICD-10-CM | POA: Diagnosis not present

## 2022-10-16 ENCOUNTER — Ambulatory Visit: Payer: Medicare HMO | Attending: Cardiovascular Disease | Admitting: Cardiovascular Disease

## 2022-10-16 ENCOUNTER — Encounter: Payer: Self-pay | Admitting: Cardiovascular Disease

## 2022-10-16 VITALS — BP 128/68 | HR 97 | Ht 61.0 in | Wt 123.4 lb

## 2022-10-16 DIAGNOSIS — E782 Mixed hyperlipidemia: Secondary | ICD-10-CM | POA: Diagnosis not present

## 2022-10-16 DIAGNOSIS — I779 Disorder of arteries and arterioles, unspecified: Secondary | ICD-10-CM | POA: Diagnosis not present

## 2022-10-16 DIAGNOSIS — I739 Peripheral vascular disease, unspecified: Secondary | ICD-10-CM | POA: Diagnosis not present

## 2022-10-16 DIAGNOSIS — I701 Atherosclerosis of renal artery: Secondary | ICD-10-CM

## 2022-10-16 DIAGNOSIS — I2510 Atherosclerotic heart disease of native coronary artery without angina pectoris: Secondary | ICD-10-CM

## 2022-10-16 DIAGNOSIS — I10 Essential (primary) hypertension: Secondary | ICD-10-CM

## 2022-10-16 NOTE — Patient Instructions (Signed)
Medication Instructions:  No changes *If you need a refill on your cardiac medications before your next appointment, please call your pharmacy*   Lab Work: None ordered If you have labs (blood work) drawn today and your tests are completely normal, you will receive your results only by: [place] (if you have MyChart) OR A paper copy in the mail If you have any lab test that is abnormal or we need to change your treatment, we will call you to review the results.   Testing/Procedures: Your physician has requested that you have an Aorta/Iliac Duplex. This will be take place at [place], [address] in July 2024.  No food after 11PM the night before.  Water is OK. (Don't drink liquids if you have been instructed not to for ANOTHER test) Avoid foods that produce bowel gas, for 24 hours prior to exam (see below). No breakfast, no chewing gum, no smoking or carbonated beverages. Patient may take morning medications with water. Come in for test at least 15 minutes early to register.  Your physician has requested that you have an ankle brachial index (ABI) in July 2024. During this test an ultrasound and blood pressure cuff are used to evaluate the arteries that supply the arms and legs with blood. Allow thirty minutes for this exam. There are no restrictions or special instructions. This will take place at [place], [address].     Follow-Up: At [facility], you and your health needs are our priority.  As part of our continuing mission to provide you with exceptional heart care, we have created designated Provider Care Teams.  These Care Teams include your primary Cardiologist (physician) and Advanced Practice Providers (APPs -  Physician Assistants and Nurse Practitioners) who all work together to provide you with the care you need, when you need it.  We recommend signing up for the patient portal called "MyChart".  Sign up information is provided on this After  Visit Summary.  MyChart is used to connect with patients for Virtual Visits (Telemedicine).  Patients are able to view lab/test results, encounter notes, upcoming appointments, etc.  Non-urgent messages can be sent to your provider as well.   To learn more about what you can do with MyChart, go to [email or web address].    Your next appointment:   12 month(s)  The format for your next appointment:   In Person  Provider:   Dr. Fletcher Anon  Important Information About Sugar

## 2022-10-16 NOTE — Progress Notes (Signed)
Cardiology Office Note   Date:  09/23/2018   ID:  Erica [name], DOB [birth year], MRN [id number]  PCP:  Mayra Neer, MD  Cardiologist:  Dr. Burt Knack   Chief Complaint  Patient presents with   Follow-up    pt denied chest pain      History of Present Illness: Erica [name] is a 81 y.o. female who is here today for follow-up visit regarding peripheral arterial disease.    She has known history of coronary artery disease status post CABG in 2011, mild to moderate carotid disease, hyperlipidemia and essential hypertension.  She quit smoking at the age of 72. Cardiac catheterization in July,2019 showed significant restenosis in the right coronary artery which was treated with cutting balloon angioplasty.  There was significant calcified LAD disease which was treated with staged atherectomy and drug-eluting stent placement in August.  She is status post bilateral common iliac artery kissing stent placement in 2019 for severe claudication.  No evidence of infrainguinal disease. . She has been doing well with no chest pain, shortness of breath or palpitations.  No lower extremity claudication.  Diagnosis Date   Anxiety    Chronic mid back pain    Coronary artery disease    status post multiple percutaneous interventions   Depression    History of kidney stones 1990s X 1; 2016    Hyperlipidemia    Hypertension    Hypothyroidism    Myocardial infarction ([place]) 01/2010   Osteoarthritis    "hands, back" (06/04/2018)    Past Surgical History:  Procedure Laterality Date   CARDIAC CATHETERIZATION  12-2008   CATARACT EXTRACTION W/ INTRAOCULAR LENS IMPLANT Bilateral    CORONARY ANGIOPLASTY WITH STENT PLACEMENT  2006    right coronary artery with drug-eluting stent   CORONARY ANGIOPLASTY WITH [place]    bare-metal stent to the right coronary artery   CORONARY ANGIOPLASTY WITH STENT PLACEMENT  2008   drug-eluting stent placed in the left circumflex   CORONARY ARTERY  BYPASS GRAFT  01-25-10   CABG X4   CORONARY ATHERECTOMY N/A 07/16/2018   Procedure: CORONARY ATHERECTOMY;  Surgeon: [name], MD;  Location: [facility];  Service: Cardiovascular;  Laterality: N/A;   CORONARY BALLOON ANGIOPLASTY  06/04/2018   cutting balloon   CORONARY BALLOON ANGIOPLASTY N/A 06/04/2018   Procedure: CORONARY BALLOON ANGIOPLASTY;  Surgeon: [name], MD;  Location: [facility];  Service: Cardiovascular;  Laterality: N/A;   CYSTOSCOPY WITH RETROGRADE PYELOGRAM, URETEROSCOPY AND STENT PLACEMENT Right 10/18/2015   Procedure: CYSTOSCOPY WITH RETROGRADE PYELOGRAM,  AND STENT PLACEMENT;  Surgeon: Festus Aloe, MD;  [facility]: [facility];  Service: Urology;  Laterality: Right;   CYSTOSCOPY/URETEROSCOPY/HOLMIUM LASER/STENT PLACEMENT Right 11/29/2015   Procedure: RIGHT URETEROSCOPY/RIGHT RETROGRADE PYELOGRAM/ RIGHT STENT REPLACEMENT;  Surgeon: Festus Aloe, MD;  Location: [facility];  Service: Urology;  Laterality: Right;   FOOT SURGERY Bilateral 2008-2016   "bones shortened"   INTRAVASCULAR PRESSURE WIRE/FFR STUDY N/A 06/04/2018   Procedure: INTRAVASCULAR PRESSURE WIRE/FFR STUDY;  Surgeon: [name], MD;  Location: [facility];  Service: Cardiovascular;  Laterality: N/A;   LEFT HEART CATH AND CORS/GRAFTS ANGIOGRAPHY N/A 06/04/2018   Procedure: LEFT HEART CATH AND CORS/GRAFTS ANGIOGRAPHY;  Surgeon: [name], MD;  Location: [facility];  Service: Cardiovascular;  Laterality: N/A;   ULTRASOUND GUIDANCE FOR VASCULAR ACCESS  06/04/2018   Procedure: Ultrasound Guidance For Vascular Access;  Surgeon: [name], MD;  Location: [facility];  Service: Cardiovascular;;     Current Outpatient Medications  Medication Sig Dispense Refill   amLODipine (NORVASC) 2.5 MG tablet Take 1 tablet (2.5 mg total) by mouth daily. 90 tablet 3   aspirin 81 MG tablet Take 1 tablet (81 mg total) by mouth daily. 30 tablet 0   atenolol  (TENORMIN) 25 MG tablet Take 0.5 tablets (12.5 mg total) by mouth daily. 30 tablet 2   atorvastatin (LIPITOR) 80 MG tablet Take 80 mg by mouth daily.     clopidogrel (PLAVIX) 75 MG tablet Take 1 tablet (75 mg total) by mouth daily with breakfast. 90 tablet 2   colesevelam (WELCHOL) 625 MG tablet Take 1,250 mg by mouth 2 (two) times daily with a meal.     hydrochlorothiazide (HYDRODIURIL) 25 MG tablet Take 25 mg by mouth daily.     levothyroxine (SYNTHROID, LEVOTHROID) 100 MCG tablet Take 100 mcg by mouth daily.     Multiple Vitamins-Minerals (WOMENS MULTIVITAMIN PO) Take 1 tablet by mouth 3 (three) times a week.     nitroGLYCERIN (NITROSTAT) 0.4 MG SL tablet Place 1 tablet (0.4 mg total) under the tongue every 5 (five) minutes as needed for chest pain. 25 tablet 3   sertraline (ZOLOFT) 50 MG tablet Take 50 mg by mouth daily.      No current facility-administered medications for this visit.     Allergies:   Morphine and related    Social History:  The patient  reports that she quit smoking about 21 years ago. Her smoking use included cigarettes. She has a 37.00 pack-year smoking history. She has never used smokeless tobacco. She reports that she drank alcohol. She reports that she does not use drugs.   Family History:  The patient's family history includes Other in her unknown relative; Other (age of onset: 34) in her mother.    ROS:  Please see the history of present illness.   Otherwise, review of systems are positive for none.   All other systems are reviewed and negative.    PHYSICAL EXAM: VS:  BP (!) 143/62   Pulse (!) 49   Ht '5\' 2"'$  (1.575 m)   Wt 125 lb 12.8 oz (57.1 kg)   BMI 23.01 kg/m  , BMI Body mass index is 23.01 kg/m. GEN: Well nourished, well developed, in no acute distress  HEENT: normal  Neck: no JVD, or masses.  Faint right carotid bruit Cardiac: RRR; no murmurs, rubs, or gallops,no edema  Respiratory:  clear to auscultation bilaterally, normal work of  breathing GI: soft, nontender, nondistended, + BS MS: no deformity or atrophy  Skin: warm and dry, no rash Neuro:  Strength and sensation are intact Psych: euthymic mood, full affect Vascular: Femoral pulses +2 bilaterally.  Distal pulses are palpable on both sides.  Slightly stronger on the right side than the left.   EKG:  EKG is not ordered today.     ASSESSMENT AND PLAN:  1.  Peripheral arterial disease: Status post bilateral common iliac artery stent placement.  No recurrent claudications.  I reviewed most recent Doppler studies with her which were done in July this year and showed normal ABI bilaterally with patent iliac stents.  Repeat studies in July of next year.  2.  Coronary artery disease involving native coronary arteries without angina: Continue medical therapy .    3.  Renal artery stenosis: Noted on CTA on the right side.  No indication for revascularization given that her blood pressure is  controlled with no heart failure or progressive chronic kidney disease.  4.  Essential hypertension: Blood pressure is controlled with amlodipine.    5.   Hyperlipidemia: She is currently on high-dose rosuvastatin and Zetia.  I reviewed most recent lipid profile done in July which showed an LDL of 63 and triglyceride of 73.  Continue same treatment.  6.  Carotid artery disease with moderate right ICA stenosis.  Most recent Doppler studies in July showed mild bilateral disease.   Disposition:   FU with me in 12 months  Signed,  Kathlyn Sacramento, MD  09/23/2018 10:26 AM    [place]

## 2022-10-23 DIAGNOSIS — D225 Melanocytic nevi of trunk: Secondary | ICD-10-CM | POA: Diagnosis not present

## 2022-10-23 DIAGNOSIS — L821 Other seborrheic keratosis: Secondary | ICD-10-CM | POA: Diagnosis not present

## 2022-10-23 DIAGNOSIS — L814 Other melanin hyperpigmentation: Secondary | ICD-10-CM | POA: Diagnosis not present

## 2022-10-23 DIAGNOSIS — Z85820 Personal history of malignant melanoma of skin: Secondary | ICD-10-CM | POA: Diagnosis not present

## 2022-10-23 DIAGNOSIS — D1801 Hemangioma of skin and subcutaneous tissue: Secondary | ICD-10-CM | POA: Diagnosis not present

## 2022-10-23 DIAGNOSIS — L570 Actinic keratosis: Secondary | ICD-10-CM | POA: Diagnosis not present

## 2022-11-09 DIAGNOSIS — Z23 Encounter for immunization: Secondary | ICD-10-CM | POA: Diagnosis not present

## 2022-11-09 DIAGNOSIS — I700 Atherosclerosis of aorta: Secondary | ICD-10-CM | POA: Diagnosis not present

## 2022-11-09 DIAGNOSIS — N183 Chronic kidney disease, stage 3 unspecified: Secondary | ICD-10-CM | POA: Diagnosis not present

## 2022-11-09 DIAGNOSIS — I1310 Hypertensive heart and chronic kidney disease without heart failure, with stage 1 through stage 4 chronic kidney disease, or unspecified chronic kidney disease: Secondary | ICD-10-CM | POA: Diagnosis not present

## 2022-11-09 DIAGNOSIS — Z0000 Encounter for general adult medical examination without abnormal findings: Secondary | ICD-10-CM | POA: Diagnosis not present

## 2022-11-09 DIAGNOSIS — H3530 Unspecified macular degeneration: Secondary | ICD-10-CM | POA: Diagnosis not present

## 2022-11-09 DIAGNOSIS — I25119 Atherosclerotic heart disease of native coronary artery with unspecified angina pectoris: Secondary | ICD-10-CM | POA: Diagnosis not present

## 2022-11-09 DIAGNOSIS — F322 Major depressive disorder, single episode, severe without psychotic features: Secondary | ICD-10-CM | POA: Diagnosis not present

## 2022-11-09 DIAGNOSIS — E1169 Type 2 diabetes mellitus with other specified complication: Secondary | ICD-10-CM | POA: Diagnosis not present

## 2022-12-13 DIAGNOSIS — Z79890 Hormone replacement therapy (postmenopausal): Secondary | ICD-10-CM | POA: Diagnosis not present

## 2022-12-13 DIAGNOSIS — Z888 Allergy status to other drugs, medicaments and biological substances status: Secondary | ICD-10-CM | POA: Diagnosis not present

## 2022-12-13 DIAGNOSIS — Z79899 Other long term (current) drug therapy: Secondary | ICD-10-CM | POA: Diagnosis not present

## 2022-12-13 DIAGNOSIS — C779 Secondary and unspecified malignant neoplasm of lymph node, unspecified: Secondary | ICD-10-CM | POA: Diagnosis not present

## 2022-12-13 DIAGNOSIS — Z7902 Long term (current) use of antithrombotics/antiplatelets: Secondary | ICD-10-CM | POA: Diagnosis not present

## 2022-12-13 DIAGNOSIS — Z885 Allergy status to narcotic agent status: Secondary | ICD-10-CM | POA: Diagnosis not present

## 2022-12-13 DIAGNOSIS — C4339 Malignant melanoma of other parts of face: Secondary | ICD-10-CM | POA: Diagnosis not present

## 2022-12-13 DIAGNOSIS — L234 Allergic contact dermatitis due to dyes: Secondary | ICD-10-CM | POA: Diagnosis not present

## 2023-01-07 DIAGNOSIS — R300 Dysuria: Secondary | ICD-10-CM | POA: Diagnosis not present

## 2023-01-09 ENCOUNTER — Other Ambulatory Visit: Payer: Self-pay

## 2023-01-09 MED ORDER — NITROGLYCERIN 0.4 MG SL SUBL: 0.4000 mg | SUBLINGUAL_TABLET | SUBLINGUAL | 2 refills | Status: DC | PRN

## 2023-04-23 DIAGNOSIS — L814 Other melanin hyperpigmentation: Secondary | ICD-10-CM | POA: Diagnosis not present

## 2023-04-23 DIAGNOSIS — Z85820 Personal history of malignant melanoma of skin: Secondary | ICD-10-CM | POA: Diagnosis not present

## 2023-04-23 DIAGNOSIS — D0472 Carcinoma in situ of skin of left lower limb, including hip: Secondary | ICD-10-CM | POA: Diagnosis not present

## 2023-04-23 DIAGNOSIS — L821 Other seborrheic keratosis: Secondary | ICD-10-CM | POA: Diagnosis not present

## 2023-04-23 DIAGNOSIS — L570 Actinic keratosis: Secondary | ICD-10-CM | POA: Diagnosis not present

## 2023-04-23 DIAGNOSIS — Z85828 Personal history of other malignant neoplasm of skin: Secondary | ICD-10-CM | POA: Diagnosis not present

## 2023-04-23 DIAGNOSIS — C44622 Squamous cell carcinoma of skin of right upper limb, including shoulder: Secondary | ICD-10-CM | POA: Diagnosis not present

## 2023-05-03 DIAGNOSIS — E039 Hypothyroidism, unspecified: Secondary | ICD-10-CM | POA: Diagnosis not present

## 2023-05-03 DIAGNOSIS — I25119 Atherosclerotic heart disease of native coronary artery with unspecified angina pectoris: Secondary | ICD-10-CM | POA: Diagnosis not present

## 2023-05-03 DIAGNOSIS — L659 Nonscarring hair loss, unspecified: Secondary | ICD-10-CM | POA: Diagnosis not present

## 2023-05-03 DIAGNOSIS — E1122 Type 2 diabetes mellitus with diabetic chronic kidney disease: Secondary | ICD-10-CM | POA: Diagnosis not present

## 2023-05-03 DIAGNOSIS — I700 Atherosclerosis of aorta: Secondary | ICD-10-CM | POA: Diagnosis not present

## 2023-05-03 DIAGNOSIS — E782 Mixed hyperlipidemia: Secondary | ICD-10-CM | POA: Diagnosis not present

## 2023-05-03 DIAGNOSIS — I1310 Hypertensive heart and chronic kidney disease without heart failure, with stage 1 through stage 4 chronic kidney disease, or unspecified chronic kidney disease: Secondary | ICD-10-CM | POA: Diagnosis not present

## 2023-05-03 DIAGNOSIS — F325 Major depressive disorder, single episode, in full remission: Secondary | ICD-10-CM | POA: Diagnosis not present

## 2023-05-03 DIAGNOSIS — N183 Chronic kidney disease, stage 3 unspecified: Secondary | ICD-10-CM | POA: Diagnosis not present

## 2023-05-03 LAB — LAB REPORT - SCANNED
A1c: 6.7
EGFR: 76

## 2023-05-14 DIAGNOSIS — M810 Age-related osteoporosis without current pathological fracture: Secondary | ICD-10-CM | POA: Diagnosis not present

## 2023-06-10 ENCOUNTER — Ambulatory Visit (HOSPITAL_BASED_OUTPATIENT_CLINIC_OR_DEPARTMENT_OTHER)
Admission: RE | Admit: 2023-06-10 | Discharge: 2023-06-10 | Disposition: A | Discharge status: Home / Self Care | Payer: Medicare HMO | Source: Ambulatory Visit | Attending: Cardiovascular Disease | Admitting: Cardiovascular Disease

## 2023-06-10 ENCOUNTER — Ambulatory Visit (HOSPITAL_COMMUNITY)
Admission: RE | Admit: 2023-06-10 | Discharge: 2023-06-10 | Disposition: A | Discharge status: Home / Self Care | Payer: Medicare HMO | Source: Ambulatory Visit | Attending: Cardiology | Admitting: Cardiology

## 2023-06-10 DIAGNOSIS — I779 Disorder of arteries and arterioles, unspecified: Secondary | ICD-10-CM

## 2023-06-10 DIAGNOSIS — I739 Peripheral vascular disease, unspecified: Secondary | ICD-10-CM | POA: Insufficient documentation

## 2023-06-10 DIAGNOSIS — Z95828 Presence of other vascular implants and grafts: Secondary | ICD-10-CM | POA: Insufficient documentation

## 2023-06-10 DIAGNOSIS — I6523 Occlusion and stenosis of bilateral carotid arteries: Secondary | ICD-10-CM

## 2023-06-11 LAB — VAS US ABI WITH/WO TBI
Left ABI: 1.05
Right ABI: 1.09

## 2023-06-18 ENCOUNTER — Other Ambulatory Visit (HOSPITAL_COMMUNITY): Payer: Self-pay | Admitting: *Deleted

## 2023-06-18 DIAGNOSIS — I739 Peripheral vascular disease, unspecified: Secondary | ICD-10-CM

## 2023-06-20 DIAGNOSIS — I6523 Occlusion and stenosis of bilateral carotid arteries: Secondary | ICD-10-CM | POA: Diagnosis not present

## 2023-06-20 DIAGNOSIS — C4339 Malignant melanoma of other parts of face: Secondary | ICD-10-CM | POA: Diagnosis not present

## 2023-06-20 DIAGNOSIS — N200 Calculus of kidney: Secondary | ICD-10-CM | POA: Diagnosis not present

## 2023-06-20 DIAGNOSIS — I700 Atherosclerosis of aorta: Secondary | ICD-10-CM | POA: Diagnosis not present

## 2023-06-20 DIAGNOSIS — I6522 Occlusion and stenosis of left carotid artery: Secondary | ICD-10-CM | POA: Diagnosis not present

## 2023-06-20 DIAGNOSIS — Z7902 Long term (current) use of antithrombotics/antiplatelets: Secondary | ICD-10-CM | POA: Diagnosis not present

## 2023-06-20 DIAGNOSIS — C770 Secondary and unspecified malignant neoplasm of lymph nodes of head, face and neck: Secondary | ICD-10-CM | POA: Diagnosis not present

## 2023-06-20 DIAGNOSIS — Z79899 Other long term (current) drug therapy: Secondary | ICD-10-CM | POA: Diagnosis not present

## 2023-06-20 DIAGNOSIS — M4316 Spondylolisthesis, lumbar region: Secondary | ICD-10-CM | POA: Diagnosis not present

## 2023-06-20 DIAGNOSIS — C779 Secondary and unspecified malignant neoplasm of lymph node, unspecified: Secondary | ICD-10-CM | POA: Diagnosis not present

## 2023-08-21 ENCOUNTER — Other Ambulatory Visit: Payer: Self-pay | Admitting: Cardiovascular Disease

## 2023-09-06 DIAGNOSIS — Z1231 Encounter for screening mammogram for malignant neoplasm of breast: Secondary | ICD-10-CM | POA: Diagnosis not present

## 2023-09-11 DIAGNOSIS — Z961 Presence of intraocular lens: Secondary | ICD-10-CM | POA: Diagnosis not present

## 2023-09-11 DIAGNOSIS — H43812 Vitreous degeneration, left eye: Secondary | ICD-10-CM | POA: Diagnosis not present

## 2023-09-11 DIAGNOSIS — H353131 Nonexudative age-related macular degeneration, bilateral, early dry stage: Secondary | ICD-10-CM | POA: Diagnosis not present

## 2023-09-11 DIAGNOSIS — D3132 Benign neoplasm of left choroid: Secondary | ICD-10-CM | POA: Diagnosis not present

## 2023-09-11 DIAGNOSIS — H524 Presbyopia: Secondary | ICD-10-CM | POA: Diagnosis not present

## 2023-09-19 DIAGNOSIS — C779 Secondary and unspecified malignant neoplasm of lymph node, unspecified: Secondary | ICD-10-CM | POA: Diagnosis not present

## 2023-09-19 DIAGNOSIS — C4339 Malignant melanoma of other parts of face: Secondary | ICD-10-CM | POA: Diagnosis not present

## 2023-09-19 DIAGNOSIS — C770 Secondary and unspecified malignant neoplasm of lymph nodes of head, face and neck: Secondary | ICD-10-CM | POA: Diagnosis not present

## 2023-09-23 ENCOUNTER — Ambulatory Visit: Payer: Medicare HMO | Attending: Cardiovascular Disease | Admitting: Cardiovascular Disease

## 2023-09-23 ENCOUNTER — Encounter: Payer: Self-pay | Admitting: Cardiovascular Disease

## 2023-09-23 VITALS — BP 158/62 | HR 60 | Ht 61.5 in | Wt 120.2 lb

## 2023-09-23 DIAGNOSIS — I25118 Atherosclerotic heart disease of native coronary artery with other forms of angina pectoris: Secondary | ICD-10-CM | POA: Diagnosis not present

## 2023-09-23 DIAGNOSIS — I10 Essential (primary) hypertension: Secondary | ICD-10-CM | POA: Diagnosis not present

## 2023-09-23 DIAGNOSIS — E782 Mixed hyperlipidemia: Secondary | ICD-10-CM

## 2023-09-23 MED ORDER — ISOSORBIDE MONONITRATE ER 30 MG PO TB24: 30.0000 mg | ORAL_TABLET | Freq: Every day | ORAL | 3 refills | Status: DC

## 2023-09-23 NOTE — Assessment & Plan Note (Signed)
Continue amlodipine, increase isosorbide.

## 2023-09-23 NOTE — Progress Notes (Signed)
Cardiology Office Note:    Date:  09/23/2023   ID:  DANDRE BARINGER, DOB [birth year], MRN [id number]  PCP:  Lupita Raider, MD   [facility] Providers Cardiologist:  Tonny Bollman, MD     Referring MD: Lupita Raider, MD   Chief Complaint  Patient presents with   Coronary Artery Disease    History of Present Illness:    Erica [name] is a 82 y.o. female with a hx of:  Coronary artery disease  S/p CABG in 2011 S/p multiple PCIs S/p POBA to RCA due to ISR + staged atherectomy/stenting to LAD in 2019  Carotid artery dz Hyperlipidemia  Hypertension  Peripheral arterial disease  S/p bilat Iliac artery stenting in 2019  R RAS on prior CT >>med Rx  Melanoma s/p resection  Hypothyroidism  Bradycardia >> beta-blocker DC'd   The patient is here alone today.  She has been experiencing some nocturnal chest pain.  States that she gets a tightness in her chest when lying down to sleep at night.  It reminds her of past angina.  She takes sublingual nitroglycerin with good relief.  She has not had to take a second nitroglycerin with this.  This happens less than once per week.  She does not have exertional symptoms and she is able to exercise without chest pain or pressure.  She denies orthopnea or PND.  No leg edema.  She has problems with her balance.  She denies heart palpitations.  Current Medications: Current Meds  Medication Sig   amLODipine (NORVASC) 5 MG tablet Take 5 mg by mouth daily.   Calcium Carb-Cholecalciferol (CALCIUM + VITAMIN D3 PO) Take 1 tablet by mouth daily.    calcium citrate (CALCITRATE - DOSED IN MG ELEMENTAL CALCIUM) 950 (200 Ca) MG tablet Take by mouth.   Cholecalciferol (VITAMIN D) 50 MCG (2000 UT) tablet Take 1 tablet by mouth daily.   clopidogrel (PLAVIX) 75 MG tablet Take 1 tablet (75 mg total) by mouth daily.   denosumab (PROLIA) 60 MG/ML SOSY injection Inject 1 mL into the skin every 6 (six) months.   ezetimibe (ZETIA) 10 MG tablet TAKE  ONE TABLET BY MOUTH DAILY   imiquimod (ALDARA) 5 % cream Apply topically.   isosorbide mononitrate (IMDUR) 30 MG 24 hr tablet Take 1 tablet (30 mg total) by mouth daily.   levothyroxine (SYNTHROID, LEVOTHROID) 100 MCG tablet Take 100 mcg by mouth daily before breakfast.    minoxidil (LONITEN) 2.5 MG tablet Take by mouth.   Multiple Vitamins-Minerals (WOMENS MULTIVITAMIN PO) Take 1 tablet by mouth daily.    nitroGLYCERIN (NITROSTAT) 0.4 MG SL tablet Place 1 tablet (0.4 mg total) under the tongue every 5 (five) minutes as needed for chest pain.   rosuvastatin (CRESTOR) 40 MG tablet Take 40 mg by mouth daily.   sertraline (ZOLOFT) 50 MG tablet Take 50 mg by mouth daily.    [DISCONTINUED] isosorbide mononitrate (IMDUR) 30 MG 24 hr tablet TAKE 1/2 TABLET ONE TIME DAILY     Allergies:   Boniva [ibandronic acid], Isosulfan blue, and Morphine and codeine   ROS:   Please see the history of present illness.    All other systems reviewed and are negative.  EKGs/Labs/Other Studies Reviewed:    The following studies were reviewed today: Cardiac Studies & Procedures   CARDIAC CATHETERIZATION  CARDIAC CATHETERIZATION 07/16/2018  Narrative Successful PCI of the LAD using orbital atherectomy and stenting with a 2.75x20 mm Synergy DES  Recommend uninterrupted dual antiplatelet therapy  Cardiology Office Note:    Date:  09/23/2023   ID:  DANDRE BARINGER, DOB [birth year], MRN [id number]  PCP:  Lupita Raider, MD   [facility] Providers Cardiologist:  Tonny Bollman, MD     Referring MD: Lupita Raider, MD   Chief Complaint  Patient presents with   Coronary Artery Disease    History of Present Illness:    Erica [name] is a 82 y.o. female with a hx of:  Coronary artery disease  S/p CABG in 2011 S/p multiple PCIs S/p POBA to RCA due to ISR + staged atherectomy/stenting to LAD in 2019  Carotid artery dz Hyperlipidemia  Hypertension  Peripheral arterial disease  S/p bilat Iliac artery stenting in 2019  R RAS on prior CT >>med Rx  Melanoma s/p resection  Hypothyroidism  Bradycardia >> beta-blocker DC'd   The patient is here alone today.  She has been experiencing some nocturnal chest pain.  States that she gets a tightness in her chest when lying down to sleep at night.  It reminds her of past angina.  She takes sublingual nitroglycerin with good relief.  She has not had to take a second nitroglycerin with this.  This happens less than once per week.  She does not have exertional symptoms and she is able to exercise without chest pain or pressure.  She denies orthopnea or PND.  No leg edema.  She has problems with her balance.  She denies heart palpitations.  Current Medications: Current Meds  Medication Sig   amLODipine (NORVASC) 5 MG tablet Take 5 mg by mouth daily.   Calcium Carb-Cholecalciferol (CALCIUM + VITAMIN D3 PO) Take 1 tablet by mouth daily.    calcium citrate (CALCITRATE - DOSED IN MG ELEMENTAL CALCIUM) 950 (200 Ca) MG tablet Take by mouth.   Cholecalciferol (VITAMIN D) 50 MCG (2000 UT) tablet Take 1 tablet by mouth daily.   clopidogrel (PLAVIX) 75 MG tablet Take 1 tablet (75 mg total) by mouth daily.   denosumab (PROLIA) 60 MG/ML SOSY injection Inject 1 mL into the skin every 6 (six) months.   ezetimibe (ZETIA) 10 MG tablet TAKE  ONE TABLET BY MOUTH DAILY   imiquimod (ALDARA) 5 % cream Apply topically.   isosorbide mononitrate (IMDUR) 30 MG 24 hr tablet Take 1 tablet (30 mg total) by mouth daily.   levothyroxine (SYNTHROID, LEVOTHROID) 100 MCG tablet Take 100 mcg by mouth daily before breakfast.    minoxidil (LONITEN) 2.5 MG tablet Take by mouth.   Multiple Vitamins-Minerals (WOMENS MULTIVITAMIN PO) Take 1 tablet by mouth daily.    nitroGLYCERIN (NITROSTAT) 0.4 MG SL tablet Place 1 tablet (0.4 mg total) under the tongue every 5 (five) minutes as needed for chest pain.   rosuvastatin (CRESTOR) 40 MG tablet Take 40 mg by mouth daily.   sertraline (ZOLOFT) 50 MG tablet Take 50 mg by mouth daily.    [DISCONTINUED] isosorbide mononitrate (IMDUR) 30 MG 24 hr tablet TAKE 1/2 TABLET ONE TIME DAILY     Allergies:   Boniva [ibandronic acid], Isosulfan blue, and Morphine and codeine   ROS:   Please see the history of present illness.    All other systems reviewed and are negative.  EKGs/Labs/Other Studies Reviewed:    The following studies were reviewed today: Cardiac Studies & Procedures   CARDIAC CATHETERIZATION  CARDIAC CATHETERIZATION 07/16/2018  Narrative Successful PCI of the LAD using orbital atherectomy and stenting with a 2.75x20 mm Synergy DES  Recommend uninterrupted dual antiplatelet therapy  minimum of 6 months (stable ischemic heart disease - Class I recommendation).  Findings Coronary Findings Diagnostic  Dominance: Right  Left Anterior Descending There is mild diffuse disease throughout the vessel. Ost LAD to Prox LAD lesion is 75% stenosed. The lesion is eccentric. The lesion is severely calcified. moderately severe stenosis with heavy calcification. FFR = 0.91 at rest and 0.77 with IV adenosine  Left Circumflex Ost Cx to Prox Cx lesion is 100% stenosed.  Right Coronary Artery Ost RCA to Prox RCA lesion is 40% stenosed. Mid RCA lesion is 75% stenosed. The lesion is calcified. The lesion was previously treated using a bare metal stent and a drug eluting stent over 2 years ago. Old bare metal stent and overlapping Taxus DES in same segment with focal 75% stenosis. FFR at rest = 0.91, FFR with IV adenosine = 0.75  Saphenous Graft To Ost 1st Mrg SVG and is normal  in caliber. SVG-OM patent  LIMA LIMA Graft To Mid LAD LIMA. The graft is atretic.  LIMA-LAD is atretic  Sequential Jump Graft Graft To RPDA, 1st RPL Seq SVG-. 100% occlusion at the ostium of the graft Origin lesion before RPDA  is 100% stenosed.  Intervention  Mid RCA lesion Angioplasty Lesion length:  10 mm. Scoring balloon angioplasty was performed using a BALLOON WOLVERINE 2.50X10. Maximum pressure: 12 atm. An AL-1 guide is used. A comet wire is used for FFR (0.75). The lesion has overlapping DES inside of a BMS platform. The lesion is treated with a 2.5 mm Wolverine cutting balloon to 12 atm (burst pressure) on 2 inflations. There is 10% residual stenosis and TIMI-3 flow. Post-Intervention Lesion Assessment The intervention was successful. Pre-interventional TIMI flow is 3. Post-intervention TIMI flow is 3. No complications occurred at this lesion. There is a 10% residual stenosis post intervention.   STRESS TESTS  MYOCARDIAL PERFUSION IMAGING 05/24/2021  Narrative  The left ventricular ejection fraction is normal (55-65%).  Nuclear stress EF: 65%.  There was no ST segment deviation noted during stress.  No T wave inversion was noted during stress.  The study is normal.  This is a low risk study.  There was brief but significant bradycardia with lexiscan injection, returned to baseline after several seconds. Low risk study without evidence of ischemia.   ECHOCARDIOGRAM  ECHOCARDIOGRAM COMPLETE 09/08/2021  Narrative ECHOCARDIOGRAM REPORT    Patient Name:   ASHNI LEINBERGER Date of Exam: 09/08/2021 Medical Rec #:  [id number]       Height:       61.0 in Accession #:    [id number]      Weight:       131.2 lb Date of Birth:  [birth year]       BSA:          1.579 m Patient Age:    80 years        BP:           130/50 mmHg Patient Gender: F               HR:           50 bpm. Exam Location:  [address]  Procedure: 2D Echo, Cardiac Doppler and Color  Doppler  Indications:    R06.02 SOB; I25.10 Coronary artery disease  History:        Patient has prior history of Echocardiogram examinations, most recent 10/18/2015. PAD, Signs/Symptoms:Chest Pain; Risk Factors:Hypertension and Dyslipidemia. Carotid bruit. Pleural effusion. Diastolic dysfunction.  Sonographer:    Marylene Land  minimum of 6 months (stable ischemic heart disease - Class I recommendation).  Findings Coronary Findings Diagnostic  Dominance: Right  Left Anterior Descending There is mild diffuse disease throughout the vessel. Ost LAD to Prox LAD lesion is 75% stenosed. The lesion is eccentric. The lesion is severely calcified. moderately severe stenosis with heavy calcification. FFR = 0.91 at rest and 0.77 with IV adenosine  Left Circumflex Ost Cx to Prox Cx lesion is 100% stenosed.  Right Coronary Artery Ost RCA to Prox RCA lesion is 40% stenosed. Mid RCA lesion is 75% stenosed. The lesion is calcified. The lesion was previously treated using a bare metal stent and a drug eluting stent over 2 years ago. Old bare metal stent and overlapping Taxus DES in same segment with focal 75% stenosis. FFR at rest = 0.91, FFR with IV adenosine = 0.75  Saphenous Graft To Ost 1st Mrg SVG and is normal  in caliber. SVG-OM patent  LIMA LIMA Graft To Mid LAD LIMA. The graft is atretic.  LIMA-LAD is atretic  Sequential Jump Graft Graft To RPDA, 1st RPL Seq SVG-. 100% occlusion at the ostium of the graft Origin lesion before RPDA  is 100% stenosed.  Intervention  Mid RCA lesion Angioplasty Lesion length:  10 mm. Scoring balloon angioplasty was performed using a BALLOON WOLVERINE 2.50X10. Maximum pressure: 12 atm. An AL-1 guide is used. A comet wire is used for FFR (0.75). The lesion has overlapping DES inside of a BMS platform. The lesion is treated with a 2.5 mm Wolverine cutting balloon to 12 atm (burst pressure) on 2 inflations. There is 10% residual stenosis and TIMI-3 flow. Post-Intervention Lesion Assessment The intervention was successful. Pre-interventional TIMI flow is 3. Post-intervention TIMI flow is 3. No complications occurred at this lesion. There is a 10% residual stenosis post intervention.   STRESS TESTS  MYOCARDIAL PERFUSION IMAGING 05/24/2021  Narrative  The left ventricular ejection fraction is normal (55-65%).  Nuclear stress EF: 65%.  There was no ST segment deviation noted during stress.  No T wave inversion was noted during stress.  The study is normal.  This is a low risk study.  There was brief but significant bradycardia with lexiscan injection, returned to baseline after several seconds. Low risk study without evidence of ischemia.   ECHOCARDIOGRAM  ECHOCARDIOGRAM COMPLETE 09/08/2021  Narrative ECHOCARDIOGRAM REPORT    Patient Name:   ASHNI LEINBERGER Date of Exam: 09/08/2021 Medical Rec #:  [id number]       Height:       61.0 in Accession #:    [id number]      Weight:       131.2 lb Date of Birth:  [birth year]       BSA:          1.579 m Patient Age:    80 years        BP:           130/50 mmHg Patient Gender: F               HR:           50 bpm. Exam Location:  [address]  Procedure: 2D Echo, Cardiac Doppler and Color  Doppler  Indications:    R06.02 SOB; I25.10 Coronary artery disease  History:        Patient has prior history of Echocardiogram examinations, most recent 10/18/2015. PAD, Signs/Symptoms:Chest Pain; Risk Factors:Hypertension and Dyslipidemia. Carotid bruit. Pleural effusion. Diastolic dysfunction.  Sonographer:    Marylene Land  Cardiology Office Note:    Date:  09/23/2023   ID:  DANDRE BARINGER, DOB [birth year], MRN [id number]  PCP:  Lupita Raider, MD   [facility] Providers Cardiologist:  Tonny Bollman, MD     Referring MD: Lupita Raider, MD   Chief Complaint  Patient presents with   Coronary Artery Disease    History of Present Illness:    Erica [name] is a 82 y.o. female with a hx of:  Coronary artery disease  S/p CABG in 2011 S/p multiple PCIs S/p POBA to RCA due to ISR + staged atherectomy/stenting to LAD in 2019  Carotid artery dz Hyperlipidemia  Hypertension  Peripheral arterial disease  S/p bilat Iliac artery stenting in 2019  R RAS on prior CT >>med Rx  Melanoma s/p resection  Hypothyroidism  Bradycardia >> beta-blocker DC'd   The patient is here alone today.  She has been experiencing some nocturnal chest pain.  States that she gets a tightness in her chest when lying down to sleep at night.  It reminds her of past angina.  She takes sublingual nitroglycerin with good relief.  She has not had to take a second nitroglycerin with this.  This happens less than once per week.  She does not have exertional symptoms and she is able to exercise without chest pain or pressure.  She denies orthopnea or PND.  No leg edema.  She has problems with her balance.  She denies heart palpitations.  Current Medications: Current Meds  Medication Sig   amLODipine (NORVASC) 5 MG tablet Take 5 mg by mouth daily.   Calcium Carb-Cholecalciferol (CALCIUM + VITAMIN D3 PO) Take 1 tablet by mouth daily.    calcium citrate (CALCITRATE - DOSED IN MG ELEMENTAL CALCIUM) 950 (200 Ca) MG tablet Take by mouth.   Cholecalciferol (VITAMIN D) 50 MCG (2000 UT) tablet Take 1 tablet by mouth daily.   clopidogrel (PLAVIX) 75 MG tablet Take 1 tablet (75 mg total) by mouth daily.   denosumab (PROLIA) 60 MG/ML SOSY injection Inject 1 mL into the skin every 6 (six) months.   ezetimibe (ZETIA) 10 MG tablet TAKE  ONE TABLET BY MOUTH DAILY   imiquimod (ALDARA) 5 % cream Apply topically.   isosorbide mononitrate (IMDUR) 30 MG 24 hr tablet Take 1 tablet (30 mg total) by mouth daily.   levothyroxine (SYNTHROID, LEVOTHROID) 100 MCG tablet Take 100 mcg by mouth daily before breakfast.    minoxidil (LONITEN) 2.5 MG tablet Take by mouth.   Multiple Vitamins-Minerals (WOMENS MULTIVITAMIN PO) Take 1 tablet by mouth daily.    nitroGLYCERIN (NITROSTAT) 0.4 MG SL tablet Place 1 tablet (0.4 mg total) under the tongue every 5 (five) minutes as needed for chest pain.   rosuvastatin (CRESTOR) 40 MG tablet Take 40 mg by mouth daily.   sertraline (ZOLOFT) 50 MG tablet Take 50 mg by mouth daily.    [DISCONTINUED] isosorbide mononitrate (IMDUR) 30 MG 24 hr tablet TAKE 1/2 TABLET ONE TIME DAILY     Allergies:   Boniva [ibandronic acid], Isosulfan blue, and Morphine and codeine   ROS:   Please see the history of present illness.    All other systems reviewed and are negative.  EKGs/Labs/Other Studies Reviewed:    The following studies were reviewed today: Cardiac Studies & Procedures   CARDIAC CATHETERIZATION  CARDIAC CATHETERIZATION 07/16/2018  Narrative Successful PCI of the LAD using orbital atherectomy and stenting with a 2.75x20 mm Synergy DES  Recommend uninterrupted dual antiplatelet therapy  minimum of 6 months (stable ischemic heart disease - Class I recommendation).  Findings Coronary Findings Diagnostic  Dominance: Right  Left Anterior Descending There is mild diffuse disease throughout the vessel. Ost LAD to Prox LAD lesion is 75% stenosed. The lesion is eccentric. The lesion is severely calcified. moderately severe stenosis with heavy calcification. FFR = 0.91 at rest and 0.77 with IV adenosine  Left Circumflex Ost Cx to Prox Cx lesion is 100% stenosed.  Right Coronary Artery Ost RCA to Prox RCA lesion is 40% stenosed. Mid RCA lesion is 75% stenosed. The lesion is calcified. The lesion was previously treated using a bare metal stent and a drug eluting stent over 2 years ago. Old bare metal stent and overlapping Taxus DES in same segment with focal 75% stenosis. FFR at rest = 0.91, FFR with IV adenosine = 0.75  Saphenous Graft To Ost 1st Mrg SVG and is normal  in caliber. SVG-OM patent  LIMA LIMA Graft To Mid LAD LIMA. The graft is atretic.  LIMA-LAD is atretic  Sequential Jump Graft Graft To RPDA, 1st RPL Seq SVG-. 100% occlusion at the ostium of the graft Origin lesion before RPDA  is 100% stenosed.  Intervention  Mid RCA lesion Angioplasty Lesion length:  10 mm. Scoring balloon angioplasty was performed using a BALLOON WOLVERINE 2.50X10. Maximum pressure: 12 atm. An AL-1 guide is used. A comet wire is used for FFR (0.75). The lesion has overlapping DES inside of a BMS platform. The lesion is treated with a 2.5 mm Wolverine cutting balloon to 12 atm (burst pressure) on 2 inflations. There is 10% residual stenosis and TIMI-3 flow. Post-Intervention Lesion Assessment The intervention was successful. Pre-interventional TIMI flow is 3. Post-intervention TIMI flow is 3. No complications occurred at this lesion. There is a 10% residual stenosis post intervention.   STRESS TESTS  MYOCARDIAL PERFUSION IMAGING 05/24/2021  Narrative  The left ventricular ejection fraction is normal (55-65%).  Nuclear stress EF: 65%.  There was no ST segment deviation noted during stress.  No T wave inversion was noted during stress.  The study is normal.  This is a low risk study.  There was brief but significant bradycardia with lexiscan injection, returned to baseline after several seconds. Low risk study without evidence of ischemia.   ECHOCARDIOGRAM  ECHOCARDIOGRAM COMPLETE 09/08/2021  Narrative ECHOCARDIOGRAM REPORT    Patient Name:   ASHNI LEINBERGER Date of Exam: 09/08/2021 Medical Rec #:  [id number]       Height:       61.0 in Accession #:    [id number]      Weight:       131.2 lb Date of Birth:  [birth year]       BSA:          1.579 m Patient Age:    80 years        BP:           130/50 mmHg Patient Gender: F               HR:           50 bpm. Exam Location:  [address]  Procedure: 2D Echo, Cardiac Doppler and Color  Doppler  Indications:    R06.02 SOB; I25.10 Coronary artery disease  History:        Patient has prior history of Echocardiogram examinations, most recent 10/18/2015. PAD, Signs/Symptoms:Chest Pain; Risk Factors:Hypertension and Dyslipidemia. Carotid bruit. Pleural effusion. Diastolic dysfunction.  Sonographer:    Marylene Land

## 2023-09-23 NOTE — Patient Instructions (Signed)
Medication Instructions:  INCREASE Imdur/Isosorbide to 30mg  daily *If you need a refill on your cardiac medications before your next appointment, please call your pharmacy*  Follow-Up: At [facility], you and your health needs are our priority.  As part of our continuing mission to provide you with exceptional heart care, we have created designated Provider Care Teams.  These Care Teams include your primary Cardiologist (physician) and Advanced Practice Providers (APPs -  Physician Assistants and Nurse Practitioners) who all work together to provide you with the care you need, when you need it.  Your next appointment:   2 month(s)  Provider:   Tonny Bollman, MD

## 2023-09-23 NOTE — Assessment & Plan Note (Signed)
The patient is having nocturnal angina.  She will continue on amlodipine.  I advised her to increase isosorbide to 30 mg daily.  She will continue on clopidogrel for antiplatelet therapy.  She will continue on high intensity statin drug.  I will see her back in 2 months for follow-up to assess her clinical response and consider further testing if indicated.  She understands to seek immediate medical attention if symptoms worsen or become nitroglycerin refractory.  We discussed consideration of stress testing but she is not inclined to have a pharmacologic stress test and states that she is unable to walk on a treadmill.  Will try medical therapy with close follow-up.

## 2023-10-22 ENCOUNTER — Ambulatory Visit: Payer: Medicare HMO | Attending: Cardiovascular Disease | Admitting: Cardiovascular Disease

## 2023-10-22 ENCOUNTER — Encounter: Payer: Self-pay | Admitting: Cardiovascular Disease

## 2023-10-22 VITALS — BP 118/58 | HR 55 | Ht 61.0 in | Wt 123.4 lb

## 2023-10-22 DIAGNOSIS — E785 Hyperlipidemia, unspecified: Secondary | ICD-10-CM

## 2023-10-22 DIAGNOSIS — I10 Essential (primary) hypertension: Secondary | ICD-10-CM

## 2023-10-22 DIAGNOSIS — I739 Peripheral vascular disease, unspecified: Secondary | ICD-10-CM

## 2023-10-22 DIAGNOSIS — I2510 Atherosclerotic heart disease of native coronary artery without angina pectoris: Secondary | ICD-10-CM | POA: Diagnosis not present

## 2023-10-22 DIAGNOSIS — I701 Atherosclerosis of renal artery: Secondary | ICD-10-CM | POA: Diagnosis not present

## 2023-10-22 DIAGNOSIS — I779 Disorder of arteries and arterioles, unspecified: Secondary | ICD-10-CM

## 2023-10-22 NOTE — Patient Instructions (Signed)

## 2023-10-22 NOTE — Progress Notes (Signed)
Cardiology Office Note   Date:  10/22/2023   ID:  Erica [name], DOB 1941-10-15, MRN [id number]  PCP:  Lupita Raider, MD  Cardiologist:  Dr. Excell Seltzer  No chief complaint on file.     History of Present Illness: Erica [name] is a 82 y.o. female who presents for a follow-up visit regarding peripheral arterial disease.    She has known history of coronary artery disease status post CABG in 2011, mild to moderate carotid disease, hyperlipidemia and essential hypertension.  She quit smoking at the age of 41. Cardiac catheterization in July,2019 showed significant restenosis in the right coronary artery which was treated with cutting balloon angioplasty.  There was significant calcified LAD disease which was treated with staged atherectomy and drug-eluting stent placement .  She is status post bilateral common iliac artery kissing stent placement in 2019 for severe claudication.  No evidence of infrainguinal disease. . She has been doing well with no chest pain, shortness of breath or palpitations.  No lower extremity claudication.   Past Medical History:  Diagnosis Date   Anxiety    Chronic mid back pain    Coronary artery disease    status post multiple percutaneous interventions // Echocardiogram 10/22: EF 60-65, no RWMA, normal RVSF, RVSP 27.6, trivial MR   Depression    History of kidney stones 1990s X 1; 2016    Hyperlipidemia    Hypertension    Hypothyroidism    Myocardial infarction (HCC) 01/2010   Osteoarthritis    "hands, back" (10/15/2018)    Past Surgical History:  Procedure Laterality Date   ABDOMINAL AORTOGRAM W/LOWER EXTREMITY  10/15/2018   ABDOMINAL AORTOGRAM W/LOWER EXTREMITY N/A 10/15/2018   Procedure: ABDOMINAL AORTOGRAM W/LOWER EXTREMITY;  Surgeon: Iran Ouch, MD;  Location: [facility] IN[facility]ASIVE CV [facility];  Service: Cardiovascular;  Laterality: N/A;   CARDIAC CATHETERIZATION  12-2008   CATARACT EXTRACTION W/ INTRAOCULAR LENS IMPLANT Bilateral    CORONARY  ANGIOPLASTY WITH STENT PLACEMENT  2006    right coronary artery with drug-eluting stent   CORONARY ANGIOPLASTY WITH STENT PLACEMENT  1998    bare-metal stent to the right coronary artery   CORONARY ANGIOPLASTY WITH STENT PLACEMENT  2008   drug-eluting stent placed in the left circumflex   CORONARY ARTERY BYPASS GRAFT  01-25-10   CABG X4   CORONARY ATHERECTOMY N/A 07/16/2018   Procedure: CORONARY ATHERECTOMY;  Surgeon: Tonny Bollman, MD;  Location: [facility] INVASIVE CV LAB;  Service: Cardiovascular;  Laterality: N/A;   CORONARY BALLOON ANGIOPLASTY N/A 06/04/2018   Procedure: CORONARY BALLOON ANGIOPLASTY;  Surgeon: Tonny Bollman, MD;  Location: [facility] INVASIVE CV LAB;  Service: Cardiovascular;  Laterality: N/A;   CORONARY PRESSURE/FFR STUDY N/A 06/04/2018   Procedure: INTRAVASCULAR PRESSURE WIRE/FFR STUDY;  Surgeon: Tonny Bollman, MD;  Location: [facility] INVASIVE CV LAB;  Service: Cardiovascular;  Laterality: N/A;   CYSTOSCOPY WITH RETROGRADE PYELOGRAM, URETEROSCOPY AND STENT PLACEMENT Right 10/18/2015   Procedure: CYSTOSCOPY WITH RETROGRADE PYELOGRAM,  AND STENT PLACEMENT;  Surgeon: Jerilee Field, MD;  Location: [facility];  Service: Urology;  Laterality: Right;   CYSTOSCOPY/URETEROSCOPY/HOLMIUM LASER/STENT PLACEMENT Right 11/29/2015   Procedure: RIGHT URETEROSCOPY/RIGHT RETROGRADE PYELOGRAM/ RIGHT STENT REPLACEMENT;  Surgeon: Jerilee Field, MD;  Location: [facility];  Service: Urology;  Laterality: Right;   FOOT SURGERY Bilateral 2008-2016   "bones shortened prox to  great toes"   HAMMER TOE SURGERY Right 2008   2nd and 3rd digits   LEFT HEART CATH AND CORS/GRAFTS ANGIOGRAPHY N/A 06/04/2018  Procedure: LEFT HEART CATH AND CORS/GRAFTS ANGIOGRAPHY;  Surgeon: Tonny Bollman, MD;  Location: [facility] INVASIVE CV LAB;  Service: Cardiovascular;  Laterality: N/A;   PERIPHERAL VASCULAR INTERVENTION Bilateral 10/15/2018   PERIPHERAL VASCULAR INTERVENTION Bilateral 10/15/2018   Procedure: PERIPHERAL  VASCULAR INTERVENTION;  Surgeon: Iran Ouch, MD;  Location: [facility] INVASIVE CV [facility];  Service: Cardiovascular;  Laterality: Bilateral;   ULTRASOUND GUIDANCE FOR VASCULAR ACCESS  06/04/2018   Procedure: Ultrasound Guidance For Vascular Access;  Surgeon: Tonny Bollman, MD;  Location: [facility] INVASIVE CV LAB;  Service: Cardiovascular;;     Current Outpatient Medications  Medication Sig Dispense Refill   amLODipine (NORVASC) 5 MG tablet Take 5 mg by mouth daily.     Calcium Carb-Cholecalciferol (CALCIUM + VITAMIN D3 PO) Take 1 tablet by mouth daily.      calcium citrate (CALCITRATE - DOSED IN MG ELEMENTAL CALCIUM) 950 (200 Ca) MG tablet Take by mouth.     Cholecalciferol (VITAMIN D) 50 MCG (2000 UT) tablet Take 1 tablet by mouth daily.     clopidogrel (PLAVIX) 75 MG tablet Take 1 tablet (75 mg total) by mouth daily. 90 tablet 3   denosumab (PROLIA) 60 MG/ML SOSY injection Inject 1 mL into the skin every 6 (six) months.     ezetimibe (ZETIA) 10 MG tablet TAKE ONE TABLET BY MOUTH DAILY 90 tablet 3   isosorbide mononitrate (IMDUR) 30 MG 24 hr tablet Take 1 tablet (30 mg total) by mouth daily. 90 tablet 3   levothyroxine (SYNTHROID, LEVOTHROID) 100 MCG tablet Take 100 mcg by mouth daily before breakfast.      minoxidil (LONITEN) 2.5 MG tablet Take by mouth.     Multiple Vitamins-Minerals (WOMENS MULTIVITAMIN PO) Take 1 tablet by mouth daily.      nitroGLYCERIN (NITROSTAT) 0.4 MG SL tablet Place 1 tablet (0.4 mg total) under the tongue every 5 (five) minutes as needed for chest pain. 75 tablet 2   rosuvastatin (CRESTOR) 40 MG tablet Take 40 mg by mouth daily.     sertraline (ZOLOFT) 50 MG tablet Take 50 mg by mouth daily.      imiquimod (ALDARA) 5 % cream Apply topically.     No current facility-administered medications for this visit.    Allergies:   Boniva [ibandronic acid], Isosulfan blue, and Morphine and codeine    Social History:  The patient  reports that she quit smoking about 26 years ago.  Her smoking use included cigarettes. She started smoking about 63 years ago. She has a 37 pack-year smoking history. She has never used smokeless tobacco. She reports current alcohol use. She reports that she does not use drugs.   Family History:  The patient's family history includes Other in an other family member; Other (age of onset: 39) in her mother.    ROS:  Please see the history of present illness.   Otherwise, review of systems are positive for none.   All other systems are reviewed and negative.    PHYSICAL EXAM: VS:  BP (!) 118/58 (BP Location: Left Arm, Patient Position: Sitting, Cuff Size: Normal)   Pulse (!) 55   Ht 5\' 1"  (1.549 m)   Wt 123 lb 6.4 oz (56 kg)   SpO2 98%   BMI 23.32 kg/m  , BMI Body mass index is 23.32 kg/m. GEN: Well nourished, well developed, in no acute distress  HEENT: normal  Neck: no JVD, or masses.  Bilateral carotid bruits Cardiac: RRR; no murmurs, rubs, or gallops,no edema  Respiratory:  clear to auscultation bilaterally, normal work of breathing GI: soft, nontender, nondistended, + BS MS: no deformity or atrophy  Skin: warm and dry, no rash Neuro:  Strength and sensation are intact Psych: euthymic mood, full affect   EKG:  EKG is not ordered today.    Recent Labs: No results found for requested labs within last 365 days.    Lipid Panel    Component Value Date/Time   CHOL 148 02/15/2020 1109   TRIG 103 02/15/2020 1109   HDL 69 02/15/2020 1109   CHOLHDL 2.1 02/15/2020 1109   CHOLHDL 2.7 01/09/2010 0506   VLDL 17 01/09/2010 0506   LDLCALC 60 02/15/2020 1109      Wt Readings from Last 3 Encounters:  10/22/23 123 lb 6.4 oz (56 kg)  09/23/23 120 lb 3.2 oz (54.5 kg)  10/16/22 123 lb 6.4 oz (56 kg)           No data to display            ASSESSMENT AND PLAN:  1.  Peripheral arterial disease: Status post bilateral common iliac artery stent placement.  No recurrent claudications.  Send Doppler studies in July showed  normal ABI bilaterally with patent iliac stents.  Repeat studies in July of next year.  2.  Coronary artery disease involving native coronary arteries without angina: Continue medical therapy .    3.  Renal artery stenosis: Right renal artery stenosis noted on CTA .  No indication for revascularization given that her blood pressure is controlled with no heart failure or progressive chronic kidney disease.  4.  Essential hypertension: Blood pressure is controlled with amlodipine.    5.   Hyperlipidemia: She is currently on high-dose rosuvastatin and Zetia.  Her LDL has been below 70.  6.  Carotid artery disease: She does have bilateral carotid bruits but most recent carotid Doppler in July showed mild nonobstructive disease.  No need to repeat at this time.    Disposition:   FU with me in 1 year  Signed,  Lorine Bears, MD  10/22/2023 1:05 PM    [facility]

## 2023-10-24 DIAGNOSIS — D225 Melanocytic nevi of trunk: Secondary | ICD-10-CM | POA: Diagnosis not present

## 2023-10-24 DIAGNOSIS — L821 Other seborrheic keratosis: Secondary | ICD-10-CM | POA: Diagnosis not present

## 2023-10-24 DIAGNOSIS — Z85820 Personal history of malignant melanoma of skin: Secondary | ICD-10-CM | POA: Diagnosis not present

## 2023-10-24 DIAGNOSIS — L814 Other melanin hyperpigmentation: Secondary | ICD-10-CM | POA: Diagnosis not present

## 2023-10-24 DIAGNOSIS — L570 Actinic keratosis: Secondary | ICD-10-CM | POA: Diagnosis not present

## 2023-10-24 DIAGNOSIS — Z85828 Personal history of other malignant neoplasm of skin: Secondary | ICD-10-CM | POA: Diagnosis not present

## 2023-11-14 DIAGNOSIS — I700 Atherosclerosis of aorta: Secondary | ICD-10-CM | POA: Diagnosis not present

## 2023-11-14 DIAGNOSIS — I1310 Hypertensive heart and chronic kidney disease without heart failure, with stage 1 through stage 4 chronic kidney disease, or unspecified chronic kidney disease: Secondary | ICD-10-CM | POA: Diagnosis not present

## 2023-11-14 DIAGNOSIS — I25119 Atherosclerotic heart disease of native coronary artery with unspecified angina pectoris: Secondary | ICD-10-CM | POA: Diagnosis not present

## 2023-11-14 DIAGNOSIS — M810 Age-related osteoporosis without current pathological fracture: Secondary | ICD-10-CM | POA: Diagnosis not present

## 2023-11-14 DIAGNOSIS — R059 Cough, unspecified: Secondary | ICD-10-CM | POA: Diagnosis not present

## 2023-11-14 DIAGNOSIS — N183 Chronic kidney disease, stage 3 unspecified: Secondary | ICD-10-CM | POA: Diagnosis not present

## 2023-11-14 DIAGNOSIS — Z20822 Contact with and (suspected) exposure to covid-19: Secondary | ICD-10-CM | POA: Diagnosis not present

## 2023-11-14 DIAGNOSIS — J988 Other specified respiratory disorders: Secondary | ICD-10-CM | POA: Diagnosis not present

## 2023-11-14 DIAGNOSIS — Z0000 Encounter for general adult medical examination without abnormal findings: Secondary | ICD-10-CM | POA: Diagnosis not present

## 2023-11-14 DIAGNOSIS — F322 Major depressive disorder, single episode, severe without psychotic features: Secondary | ICD-10-CM | POA: Diagnosis not present

## 2023-11-14 DIAGNOSIS — E782 Mixed hyperlipidemia: Secondary | ICD-10-CM | POA: Diagnosis not present

## 2023-11-14 DIAGNOSIS — E1122 Type 2 diabetes mellitus with diabetic chronic kidney disease: Secondary | ICD-10-CM | POA: Diagnosis not present

## 2023-11-14 DIAGNOSIS — Z20818 Contact with and (suspected) exposure to other bacterial communicable diseases: Secondary | ICD-10-CM | POA: Diagnosis not present

## 2023-11-14 DIAGNOSIS — E039 Hypothyroidism, unspecified: Secondary | ICD-10-CM | POA: Diagnosis not present

## 2023-11-14 DIAGNOSIS — R053 Chronic cough: Secondary | ICD-10-CM | POA: Diagnosis not present

## 2023-11-14 LAB — LAB REPORT - SCANNED
A1c: 6.5
Creatinine, POC: 121 mg/dL
EGFR: 68
Microalb Creat Ratio: 33.8
Microalbumin, Urine: 4.08

## 2023-11-15 ENCOUNTER — Encounter: Payer: Self-pay | Admitting: Cardiovascular Disease

## 2023-11-15 ENCOUNTER — Ambulatory Visit: Payer: Medicare HMO | Attending: Cardiovascular Disease | Admitting: Cardiovascular Disease

## 2023-11-15 VITALS — BP 120/70 | HR 60 | Ht 61.5 in | Wt 124.2 lb

## 2023-11-15 DIAGNOSIS — I10 Essential (primary) hypertension: Secondary | ICD-10-CM

## 2023-11-15 DIAGNOSIS — I25118 Atherosclerotic heart disease of native coronary artery with other forms of angina pectoris: Secondary | ICD-10-CM

## 2023-11-15 DIAGNOSIS — E782 Mixed hyperlipidemia: Secondary | ICD-10-CM | POA: Diagnosis not present

## 2023-11-15 NOTE — Assessment & Plan Note (Signed)
Blood pressure well-controlled.  Continue current medicines.

## 2023-11-15 NOTE — Patient Instructions (Signed)
Follow-Up: At [facility], you and your health needs are our priority.  As part of our continuing mission to provide you with exceptional heart care, we have created designated Provider Care Teams.  These Care Teams include your primary Cardiologist (physician) and Advanced Practice Providers (APPs -  Physician Assistants and Nurse Practitioners) who all work together to provide you with the care you need, when you need it.  Your next appointment:   6 month(s)  Provider:   Tonny Bollman, MD

## 2023-11-15 NOTE — Progress Notes (Signed)
Cardiology Office Note:    Date:  11/15/2023   ID:  Erica [name], DOB 28-Dec-1940, MRN [id number]  PCP:  Lupita Raider, MD   [facility] Providers Cardiologist:  Tonny Bollman, MD     Referring MD: Lupita Raider, MD   Chief Complaint  Patient presents with   Coronary Artery Disease    History of Present Illness:    Erica [name] is a 82 y.o. female with a hx of:  Coronary artery disease  S/p CABG in 2011 S/p multiple PCIs S/p POBA to RCA due to ISR + staged atherectomy/stenting to LAD in 2019  Carotid artery dz Hyperlipidemia  Hypertension  Peripheral arterial disease  S/p bilat Iliac artery stenting in 2019  R RAS on prior CT >>med Rx  Melanoma s/p resection  Hypothyroidism  Bradycardia >> beta-blocker DC'd   The patient was seen in October 2024 and she had developed nocturnal angina and that reminded her previous cardiac pain.  I increased her isosorbide and scheduled her to return today for follow-up evaluation.  She is here alone today.  Overall she is doing pretty well.  She has only taken 1 sublingual nitroglycerin since her last visit here a few months ago.  She is not having any exertional symptoms.  She denies shortness of breath, orthopnea, or PND.  Her rare episodes of angina occur at nighttime and feel like a substernal chest discomfort.  She reports some problems with her balance.  Otherwise she has no complaints today.  Current Medications: Current Meds  Medication Sig   amLODipine (NORVASC) 5 MG tablet Take 5 mg by mouth daily.   calcium citrate (CALCITRATE - DOSED IN MG ELEMENTAL CALCIUM) 950 (200 Ca) MG tablet Take by mouth.   clopidogrel (PLAVIX) 75 MG tablet Take 1 tablet (75 mg total) by mouth daily.   denosumab (PROLIA) 60 MG/ML SOSY injection Inject 1 mL into the skin every 6 (six) months.   ezetimibe (ZETIA) 10 MG tablet TAKE ONE TABLET BY MOUTH DAILY   isosorbide mononitrate (IMDUR) 30 MG 24 hr tablet Take 1 tablet (30 mg total)  by mouth daily.   levothyroxine (SYNTHROID, LEVOTHROID) 100 MCG tablet Take 100 mcg by mouth daily before breakfast.    minoxidil (LONITEN) 2.5 MG tablet Take by mouth.   Multiple Vitamins-Minerals (WOMENS MULTIVITAMIN PO) Take 1 tablet by mouth daily.    nitroGLYCERIN (NITROSTAT) 0.4 MG SL tablet Place 1 tablet (0.4 mg total) under the tongue every 5 (five) minutes as needed for chest pain.   rosuvastatin (CRESTOR) 40 MG tablet Take 40 mg by mouth daily.   sertraline (ZOLOFT) 50 MG tablet Take 50 mg by mouth daily.    [DISCONTINUED] Calcium Carb-Cholecalciferol (CALCIUM + VITAMIN D3 PO) Take 1 tablet by mouth daily.      Allergies:   Boniva [ibandronic acid], Isosulfan blue, and Morphine and codeine   ROS:   Please see the history of present illness.    All other systems reviewed and are negative.  EKGs/Labs/Other Studies Reviewed:    The following studies were reviewed today: Cardiac Studies & Procedures   CARDIAC CATHETERIZATION  CARDIAC CATHETERIZATION 07/16/2018  Narrative Successful PCI of the LAD using orbital atherectomy and stenting with a 2.75x20 mm Synergy DES  Recommend uninterrupted dual antiplatelet therapy with Aspirin 81mg  daily and Clopidogrel 75mg  daily for a minimum of 12 months with proximal LAD stenting and recent RCA angioplasty of severe in-stent restenosis.  Findings Coronary Findings Diagnostic  Dominance: Right  Left  Anterior Descending There is mild diffuse disease throughout the vessel. Ost LAD to Prox LAD lesion is 75% stenosed. The lesion is eccentric. The lesion is severely calcified. moderately severe stenosis with heavy calcification. FFR = 0.91 at rest and 0.77 with IV adenosine  Left Circumflex Ost Cx to Prox Cx lesion is 100% stenosed.  Right Coronary Artery Ost RCA to Prox RCA lesion is 40% stenosed. Mid RCA lesion is 10% stenosed. The lesion is calcified. The lesion was previously treated using a bare metal stent and a drug eluting stent  over 2 years ago. Old bare metal stent and overlapping Taxus DES in same segment with focal 75% stenosis. FFR at rest = 0.91, FFR with IV adenosine = 0.75  Saphenous Graft To Ost 1st Mrg SVG and is normal in caliber. SVG-OM patent  LIMA LIMA Graft To Mid LAD LIMA. The graft is atretic.  LIMA-LAD is atretic  Sequential Jump Graft Graft To RPDA, 1st RPL Seq SVG-. 100% occlusion at the ostium of the graft Origin lesion before RPDA  is 100% stenosed.  Intervention  Ost LAD to Prox LAD lesion Stent CATH VISTA GUIDE 6FR XBLAD3.5 guide catheter was inserted. Lesion crossed with guidewire using a WIRE [employer] .012TIP. Pre-stent angioplasty was performed using a BALLOON SAPPHIRE [place] 2.5X15. A drug-eluting stent was successfully placed using a STENT SYNERGY DES 2.75X20. Post-stent angioplasty was performed using a BALLOON SAPPHIRE [place] 3.0X12. The patient is adequately treated with long-term ASA and plavix. Angiomax is used for anticoagulation and a therapeutic ACT is achieved. Atherectomy is performed with a 1.25 mm CSI orbital atherectomy classic crown. After atherectomy, PTCA and stenting are performed. There are no complications. There is 0% residual stenosis at the proximal LAD lesion site and TIMI-3 flow both pre- and post-PCI. Post-Intervention Lesion Assessment The intervention was successful. Pre-interventional TIMI flow is 3. Post-intervention TIMI flow is 3. No complications occurred at this lesion. There is a 0% residual stenosis post intervention.   CARDIAC CATHETERIZATION  CARDIAC CATHETERIZATION 06/04/2018  Narrative  Ost Cx to Prox Cx lesion is 100% stenosed.  SVG and is normal in caliber.  LIMA.  Ost LAD to Prox LAD lesion is 75% stenosed.  Mid RCA lesion is 75% stenosed.  Scoring balloon angioplasty was performed using a BALLOON WOLVERINE 2.50X10.  Post intervention, there is a 10% residual stenosis.  Ost RCA to Prox RCA lesion is 40% stenosed.  Seq SVG-.   Origin lesion before RPDA is 100% stenosed.  The left ventricular systolic function is normal.  LV end diastolic pressure is normal.  1. Severe multivessel CAD with total occlusion of the LCx, severe in-stent restenosis in the RCA, and moderately severe proximal LAD stenosis 2. 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 Mild segmental LV contraction abnormality with preserved LVEF of 55% 4. FFR of the proximal LAD = 0.77 5. FFR of the mid-RCA = 0.75, treated successfully with cutting balloon angioplasty  Recommend: Staged PCI of the LAD - will require orbital atherectomy and stenting Should be OK for DC home tomorrow am and will schedule for outpatient PCI of the LAD with atherectomy  Recommend uninterrupted dual antiplatelet therapy with Aspirin 81mg  daily and Clopidogrel 75mg  daily for a minimum of 6 months (stable ischemic heart disease - Class I recommendation).  Findings Coronary Findings Diagnostic  Dominance: Right  Left Anterior Descending There is mild diffuse disease throughout the vessel. Ost LAD to Prox  LAD lesion is 75% stenosed. The lesion is eccentric. The lesion is severely calcified. moderately severe stenosis with heavy calcification. FFR = 0.91 at rest and 0.77 with IV adenosine  Left Circumflex Ost Cx to Prox Cx lesion is 100% stenosed.  Right Coronary Artery Ost RCA to Prox RCA lesion is 40% stenosed. Mid RCA lesion is 75% stenosed. The lesion is calcified. The lesion was previously treated using a bare metal stent and a drug eluting stent over 2 years ago. Old bare metal stent and overlapping Taxus DES in same segment with focal 75% stenosis. FFR at rest = 0.91, FFR with IV adenosine = 0.75  Saphenous Graft To Ost 1st Mrg SVG and is normal in caliber. SVG-OM patent  LIMA LIMA Graft To Mid LAD LIMA. The graft is atretic.  LIMA-LAD is atretic  Sequential Jump Graft Graft To RPDA, 1st  RPL Seq SVG-. 100% occlusion at the ostium of the graft Origin lesion before RPDA  is 100% stenosed.  Intervention  Mid RCA lesion Angioplasty Lesion length:  10 mm. Scoring balloon angioplasty was performed using a BALLOON WOLVERINE 2.50X10. Maximum pressure: 12 atm. An AL-1 guide is used. A comet wire is used for FFR (0.75). The lesion has overlapping DES inside of a BMS platform. The lesion is treated with a 2.5 mm Wolverine cutting balloon to 12 atm (burst pressure) on 2 inflations. There is 10% residual stenosis and TIMI-3 flow. Post-Intervention Lesion Assessment The intervention was successful. Pre-interventional TIMI flow is 3. Post-intervention TIMI flow is 3. No complications occurred at this lesion. There is a 10% residual stenosis post intervention.   STRESS TESTS  MYOCARDIAL PERFUSION IMAGING 05/24/2021  Narrative  The left ventricular ejection fraction is normal (55-65%).  Nuclear stress EF: 65%.  There was no ST segment deviation noted during stress.  No T wave inversion was noted during stress.  The study is normal.  This is a low risk study.  There was brief but significant bradycardia with lexiscan injection, returned to baseline after several seconds. Low risk study without evidence of ischemia.  ECHOCARDIOGRAM  ECHOCARDIOGRAM COMPLETE 09/08/2021  Narrative ECHOCARDIOGRAM REPORT    Patient Name:   LAFONDRA HESTERBERG Date of Exam: 09/08/2021 Medical Rec #:  [id number]       Height:       61.0 in Accession #:    [id number]      Weight:       131.2 lb Date of Birth:  [birth year]       BSA:          1.579 m Patient Age:    80 years        BP:           130/50 mmHg Patient Gender: F               HR:           50 bpm. Exam Location:  [address]  Procedure: 2D Echo, Cardiac Doppler and Color Doppler  Indications:    R06.02 SOB; I25.10 Coronary artery disease  History:        Patient has prior history of Echocardiogram examinations, most recent 10/18/2015.  PAD, Signs/Symptoms:Chest Pain; Risk Factors:Hypertension and Dyslipidemia. Carotid bruit. Pleural effusion. Diastolic dysfunction.  Sonographer:    Cathie Beams RCS Referring Phys: Evern Bio WEAVER  IMPRESSIONS   1. Left ventricular ejection fraction, by estimation, is 60 to 65%. The left ventricle has normal function. The left ventricle has no regional wall motion abnormalities.  Left ventricular diastolic parameters were normal. 2. Right ventricular systolic function is normal. The right ventricular size is normal. There is normal pulmonary artery systolic pressure. The estimated right ventricular systolic pressure is 27.6 mmHg. 3. The mitral valve is normal in structure. Trivial mitral valve regurgitation. No evidence of mitral stenosis. 4. The aortic valve is normal in structure. Aortic valve regurgitation is not visualized. No aortic stenosis is present. 5. The inferior vena cava is normal in size with greater than 50% respiratory variability, suggesting right atrial pressure of 3 mmHg.  FINDINGS Left Ventricle: Left ventricular ejection fraction, by estimation, is 60 to 65%. The left ventricle has normal function. The left ventricle has no regional wall motion abnormalities. The left ventricular internal cavity size was normal in size. There is no left ventricular hypertrophy. Left ventricular diastolic parameters were normal. Normal left ventricular filling pressure.  Right Ventricle: The right ventricular size is normal. No increase in right ventricular wall thickness. Right ventricular systolic function is normal. There is normal pulmonary artery systolic pressure. The tricuspid regurgitant velocity is 2.48 m/s, and with an assumed right atrial pressure of 3 mmHg, the estimated right ventricular systolic pressure is 27.6 mmHg.  Left Atrium: Left atrial size was normal in size.  Right Atrium: Right atrial size was normal in size.  Pericardium: There is no evidence of pericardial  effusion.  Mitral Valve: The mitral valve is normal in structure. Trivial mitral valve regurgitation. No evidence of mitral valve stenosis.  Tricuspid Valve: The tricuspid valve is normal in structure. Tricuspid valve regurgitation is mild . No evidence of tricuspid stenosis.  Aortic Valve: The aortic valve is normal in structure. Aortic valve regurgitation is not visualized. No aortic stenosis is present.  Pulmonic Valve: The pulmonic valve was normal in structure. Pulmonic valve regurgitation is trivial. No evidence of pulmonic stenosis.  Aorta: The aortic root is normal in size and structure.  Venous: The inferior vena cava is normal in size with greater than 50% respiratory variability, suggesting right atrial pressure of 3 mmHg.  IAS/Shunts: No atrial level shunt detected by color flow Doppler.   LEFT VENTRICLE PLAX 2D LVIDd:         4.60 cm   Diastology LVIDs:         2.70 cm   LV e' medial:    8.59 cm/s LV PW:         0.70 cm   LV E/e' medial:  14.7 LV IVS:        0.90 cm   LV e' lateral:   17.00 cm/s LVOT diam:     1.85 cm   LV E/e' lateral: 7.4 LV SV:         81 LV SV Index:   51 LVOT Area:     2.69 cm   RIGHT VENTRICLE RV Basal diam:  3.00 cm RV S prime:     9.07 cm/s TAPSE (M-mode): 1.4 cm RVSP:           27.6 mmHg  LEFT ATRIUM             Index        RIGHT ATRIUM           Index LA diam:        3.70 cm 2.34 cm/m   RA Pressure: 3.00 mmHg LA Vol (A2C):   73.3 ml 46.42 ml/m  RA Area:     18.00 cm LA Vol (A4C):   57.1 ml 36.16 ml/m  RA  Volume:   52.30 ml  33.12 ml/m LA Biplane Vol: 67.0 ml 42.43 ml/m AORTIC VALVE LVOT Vmax:   115.00 cm/s LVOT Vmean:  68.000 cm/s LVOT VTI:    0.301 m  AORTA Ao Root diam: 2.80 cm Ao Asc diam:  2.80 cm  MITRAL VALVE                TRICUSPID VALVE MV Area (PHT): 4.60 cm     TR Peak grad:   24.6 mmHg MV Decel Time: 165 msec     TR Vmax:        248.00 cm/s MV E velocity: 126.00 cm/s  Estimated RAP:  3.00 mmHg MV A  velocity: 86.90 cm/s   RVSP:           27.6 mmHg MV E/A ratio:  1.45 SHUNTS Systemic VTI:  0.30 m Systemic Diam: 1.85 cm  Rachelle Hora Croitoru MD Electronically signed by Thurmon Fair MD Signature Date/Time: 09/08/2021/3:59:43 PM    Final             EKG:        Recent Labs: No results found for requested labs within last 365 days.  Recent Lipid Panel    Component Value Date/Time   CHOL 148 02/15/2020 1109   TRIG 103 02/15/2020 1109   HDL 69 02/15/2020 1109   CHOLHDL 2.1 02/15/2020 1109   CHOLHDL 2.7 01/09/2010 0506   VLDL 17 01/09/2010 0506   LDLCALC 60 02/15/2020 1109     Risk Assessment/Calculations:                Physical Exam:    VS:  BP 120/70   Pulse 60   Ht 5' 1.5" (1.562 m)   Wt 124 lb 3.2 oz (56.3 kg)   SpO2 98%   BMI 23.09 kg/m     Wt Readings from Last 3 Encounters:  11/15/23 124 lb 3.2 oz (56.3 kg)  10/22/23 123 lb 6.4 oz (56 kg)  09/23/23 120 lb 3.2 oz (54.5 kg)     GEN:  Well nourished, well developed in no acute distress HEENT: Normal NECK: No JVD; No carotid bruits LYMPHATICS: No lymphadenopathy CARDIAC: RRR, no murmurs, rubs, gallops RESPIRATORY:  Clear to auscultation without rales, wheezing or rhonchi  ABDOMEN: Soft, non-tender, non-distended MUSCULOSKELETAL:  No edema; No deformity  SKIN: Warm and dry NEUROLOGIC:  Alert and oriented x 3 PSYCHIATRIC:  Normal affect   Assessment & Plan Atherosclerosis of native coronary artery of native heart with other form of angina pectoris (HCC) The patient is improved with an increased dose of isosorbide.  She has only taken 1 sublingual nitroglycerin in the past 2 to 3 months.  She will continue her current medical program which includes clopidogrel for antiplatelet therapy as well as antianginal treatment with amlodipine and isosorbide. Essential hypertension Blood pressure well-controlled.  Continue current medicines. Mixed hyperlipidemia Labs drawn yesterday at [facility] primary care with  a cholesterol 157, HDL 60, triglycerides 96, LDL 79.  Hemoglobin A1c is 6.5.  AST and ALT are 19 and 17, respectively.  Continue rosuvastatin 40 mg daily and Zetia 10 mg daily.    Medication Adjustments/Labs and Tests Ordered: Current medicines are reviewed at length with the patient today.  Concerns regarding medicines are outlined above.  No orders of the defined types were placed in this encounter.  No orders of the defined types were placed in this encounter.   Patient Instructions  Follow-Up: At [facility], you and your health needs  are our priority.  As part of our continuing mission to provide you with exceptional heart care, we have created designated Provider Care Teams.  These Care Teams include your primary Cardiologist (physician) and Advanced Practice Providers (APPs -  Physician Assistants and Nurse Practitioners) who all work together to provide you with the care you need, when you need it.  Your next appointment:   6 month(s)  Provider:   Tonny Bollman, MD        Signed, Tonny Bollman, MD  11/15/2023 4:52 PM    [facility]

## 2023-11-15 NOTE — Assessment & Plan Note (Signed)
The patient is improved with an increased dose of isosorbide.  She has only taken 1 sublingual nitroglycerin in the past 2 to 3 months.  She will continue her current medical program which includes clopidogrel for antiplatelet therapy as well as antianginal treatment with amlodipine and isosorbide.

## 2023-11-18 ENCOUNTER — Encounter: Payer: Self-pay | Admitting: Family Medicine

## 2023-12-27 DIAGNOSIS — C770 Secondary and unspecified malignant neoplasm of lymph nodes of head, face and neck: Secondary | ICD-10-CM | POA: Diagnosis not present

## 2023-12-27 DIAGNOSIS — C779 Secondary and unspecified malignant neoplasm of lymph node, unspecified: Secondary | ICD-10-CM | POA: Diagnosis not present

## 2023-12-27 DIAGNOSIS — C4339 Malignant melanoma of other parts of face: Secondary | ICD-10-CM | POA: Diagnosis not present

## 2023-12-31 DIAGNOSIS — R051 Acute cough: Secondary | ICD-10-CM | POA: Diagnosis not present

## 2023-12-31 DIAGNOSIS — J210 Acute bronchiolitis due to respiratory syncytial virus: Secondary | ICD-10-CM | POA: Diagnosis not present

## 2023-12-31 DIAGNOSIS — R0902 Hypoxemia: Secondary | ICD-10-CM | POA: Diagnosis not present

## 2024-01-01 ENCOUNTER — Encounter (HOSPITAL_BASED_OUTPATIENT_CLINIC_OR_DEPARTMENT_OTHER): Payer: Self-pay | Admitting: Emergency Medicine

## 2024-01-01 ENCOUNTER — Other Ambulatory Visit: Payer: Self-pay

## 2024-01-01 ENCOUNTER — Inpatient Hospital Stay (HOSPITAL_BASED_OUTPATIENT_CLINIC_OR_DEPARTMENT_OTHER)
Admission: EM | Admit: 2024-01-01 | Discharge: 2024-01-03 | DRG: 193 | Disposition: A | Discharge status: Home / Self Care | Payer: Medicare HMO | Attending: Family Medicine | Admitting: Family Medicine

## 2024-01-01 ENCOUNTER — Emergency Department (HOSPITAL_BASED_OUTPATIENT_CLINIC_OR_DEPARTMENT_OTHER): Payer: Medicare HMO

## 2024-01-01 DIAGNOSIS — J210 Acute bronchiolitis due to respiratory syncytial virus: Secondary | ICD-10-CM | POA: Diagnosis present

## 2024-01-01 DIAGNOSIS — R0602 Shortness of breath: Secondary | ICD-10-CM | POA: Diagnosis not present

## 2024-01-01 DIAGNOSIS — Z85820 Personal history of malignant melanoma of skin: Secondary | ICD-10-CM | POA: Diagnosis not present

## 2024-01-01 DIAGNOSIS — F32A Depression, unspecified: Secondary | ICD-10-CM | POA: Diagnosis present

## 2024-01-01 DIAGNOSIS — J9601 Acute respiratory failure with hypoxia: Principal | ICD-10-CM | POA: Diagnosis present

## 2024-01-01 DIAGNOSIS — Z888 Allergy status to other drugs, medicaments and biological substances status: Secondary | ICD-10-CM | POA: Diagnosis not present

## 2024-01-01 DIAGNOSIS — Z951 Presence of aortocoronary bypass graft: Secondary | ICD-10-CM | POA: Diagnosis not present

## 2024-01-01 DIAGNOSIS — Z885 Allergy status to narcotic agent status: Secondary | ICD-10-CM

## 2024-01-01 DIAGNOSIS — E039 Hypothyroidism, unspecified: Secondary | ICD-10-CM | POA: Diagnosis present

## 2024-01-01 DIAGNOSIS — I252 Old myocardial infarction: Secondary | ICD-10-CM

## 2024-01-01 DIAGNOSIS — I700 Atherosclerosis of aorta: Secondary | ICD-10-CM | POA: Diagnosis not present

## 2024-01-01 DIAGNOSIS — R591 Generalized enlarged lymph nodes: Secondary | ICD-10-CM | POA: Diagnosis present

## 2024-01-01 DIAGNOSIS — Z7902 Long term (current) use of antithrombotics/antiplatelets: Secondary | ICD-10-CM | POA: Diagnosis not present

## 2024-01-01 DIAGNOSIS — I10 Essential (primary) hypertension: Secondary | ICD-10-CM | POA: Diagnosis not present

## 2024-01-01 DIAGNOSIS — Z955 Presence of coronary angioplasty implant and graft: Secondary | ICD-10-CM

## 2024-01-01 DIAGNOSIS — Z79890 Hormone replacement therapy (postmenopausal): Secondary | ICD-10-CM

## 2024-01-01 DIAGNOSIS — I517 Cardiomegaly: Secondary | ICD-10-CM | POA: Diagnosis not present

## 2024-01-01 DIAGNOSIS — Z961 Presence of intraocular lens: Secondary | ICD-10-CM | POA: Diagnosis present

## 2024-01-01 DIAGNOSIS — E785 Hyperlipidemia, unspecified: Secondary | ICD-10-CM | POA: Diagnosis not present

## 2024-01-01 DIAGNOSIS — Z1152 Encounter for screening for COVID-19: Secondary | ICD-10-CM

## 2024-01-01 DIAGNOSIS — R918 Other nonspecific abnormal finding of lung field: Secondary | ICD-10-CM | POA: Diagnosis not present

## 2024-01-01 DIAGNOSIS — Z79899 Other long term (current) drug therapy: Secondary | ICD-10-CM | POA: Diagnosis not present

## 2024-01-01 DIAGNOSIS — Z87891 Personal history of nicotine dependence: Secondary | ICD-10-CM

## 2024-01-01 DIAGNOSIS — Z86718 Personal history of other venous thrombosis and embolism: Secondary | ICD-10-CM

## 2024-01-01 DIAGNOSIS — Z91041 Radiographic dye allergy status: Secondary | ICD-10-CM

## 2024-01-01 DIAGNOSIS — J439 Emphysema, unspecified: Secondary | ICD-10-CM | POA: Diagnosis not present

## 2024-01-01 DIAGNOSIS — I2510 Atherosclerotic heart disease of native coronary artery without angina pectoris: Secondary | ICD-10-CM | POA: Diagnosis not present

## 2024-01-01 DIAGNOSIS — J121 Respiratory syncytial virus pneumonia: Secondary | ICD-10-CM | POA: Diagnosis not present

## 2024-01-01 DIAGNOSIS — J189 Pneumonia, unspecified organism: Secondary | ICD-10-CM | POA: Insufficient documentation

## 2024-01-01 DIAGNOSIS — R590 Localized enlarged lymph nodes: Secondary | ICD-10-CM | POA: Diagnosis not present

## 2024-01-01 DIAGNOSIS — R0902 Hypoxemia: Secondary | ICD-10-CM | POA: Diagnosis not present

## 2024-01-01 DIAGNOSIS — I739 Peripheral vascular disease, unspecified: Secondary | ICD-10-CM | POA: Diagnosis not present

## 2024-01-01 LAB — CBC WITH DIFFERENTIAL/PLATELET
Abs Immature Granulocytes: 0.01 10*3/uL (ref 0.00–0.07)
Basophils Absolute: 0 10*3/uL (ref 0.0–0.1)
Basophils Relative: 0 %
Eosinophils Absolute: 0.1 10*3/uL (ref 0.0–0.5)
Eosinophils Relative: 1 %
HCT: 37.7 % (ref 36.0–46.0)
Hemoglobin: 12.6 g/dL (ref 12.0–15.0)
Immature Granulocytes: 0 %
Lymphocytes Relative: 24 %
Lymphs Abs: 1.2 10*3/uL (ref 0.7–4.0)
MCH: 29.2 pg (ref 26.0–34.0)
MCHC: 33.4 g/dL (ref 30.0–36.0)
MCV: 87.5 fL (ref 80.0–100.0)
Monocytes Absolute: 0.6 10*3/uL (ref 0.1–1.0)
Monocytes Relative: 13 %
Neutro Abs: 3 10*3/uL (ref 1.7–7.7)
Neutrophils Relative %: 62 %
Platelets: 194 10*3/uL (ref 150–400)
RBC: 4.31 MIL/uL (ref 3.87–5.11)
RDW: 13 % (ref 11.5–15.5)
WBC: 5 10*3/uL (ref 4.0–10.5)
nRBC: 0 % (ref 0.0–0.2)

## 2024-01-01 LAB — BASIC METABOLIC PANEL
Anion gap: 8 (ref 5–15)
BUN: 18 mg/dL (ref 8–23)
CO2: 27 mmol/L (ref 22–32)
Calcium: 8.7 mg/dL — ABNORMAL LOW (ref 8.9–10.3)
Chloride: 103 mmol/L (ref 98–111)
Creatinine, Ser: 0.87 mg/dL (ref 0.44–1.00)
GFR, Estimated: >60 mL/min (ref >60)
Glucose, Bld: 104 mg/dL — ABNORMAL HIGH (ref 70–99)
Potassium: 3.7 mmol/L (ref 3.5–5.1)
Sodium: 138 mmol/L (ref 135–145)

## 2024-01-01 MED ORDER — ALBUTEROL SULFATE HFA 108 (90 BASE) MCG/ACT IN AERS: 2.0000 | INHALATION_SPRAY | RESPIRATORY_TRACT | Status: DC | PRN

## 2024-01-01 MED ORDER — SODIUM CHLORIDE 0.9 % IV SOLN
1.0000 g | Freq: Once | INTRAVENOUS | Status: AC
  Administered 2024-01-01: 1 g via INTRAVENOUS
  Filled 2024-01-01: qty 10

## 2024-01-01 MED ORDER — IOHEXOL 350 MG/ML SOLN
100.0000 mL | Freq: Once | INTRAVENOUS | Status: AC | PRN
  Administered 2024-01-01: 75 mL via INTRAVENOUS

## 2024-01-01 MED ORDER — AZITHROMYCIN 250 MG PO TABS
500.0000 mg | ORAL_TABLET | Freq: Once | ORAL | Status: AC
  Administered 2024-01-01: 500 mg via ORAL
  Filled 2024-01-01: qty 2

## 2024-01-01 NOTE — Progress Notes (Addendum)
Plan of Care Note for accepted transfer  Patient: Erica [name]              ZOX:[id number]  DOA: 01/01/2024     Facility requesting transfer: [facility] emergency department Requesting Provider: Fayrene Helper, PA  Reason for transfer: Acute hypoxic respiratory failure and pneumonia in the setting of RSV infection.  Facility course: 83 year old female history of melanoma, hypertension, anxiety, depression, CAD presents to ED for evaluation of shortness of breath.  Patient report 3 days ago she developed some sinus congestion, runny nose and cough.  Yesterday she felt more shortness of breath and went to urgent care for her symptoms.  She tested positive for RSV.  Today she states her shortness of breath has increased prompting this ER visit.  She does not endorse any fever chills no hemoptysis no nausea vomiting diarrhea no significant wheezing.  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  She also has history of melanoma on her face status post surgical resection.  She denies any history of PE.  She is currently on Plavix for prior cardiac stenting.  She does not endorse any active chest pain of pleuritic component.   At presentation to ED patient O2 sat dropped to 86% room air which has been improved to 95% on 2 L.  Blood pressure is borderline soft 95/83 heart rate around 55-60. BMP and CBC unremarkable.  CTA chest no evidence of PE.  However it showed bilateral lower lobe peribronchial plugging, mild patchy bilateral groundglass opacities concerning for infection.  Mild cardiomegaly.  Reactive lymphadenopathy.  Chest x-ray remarkable.  In the ED patient has been treated with azithromycin and ceftriaxone. Recommend to check the respiratory 20 panel.  Hospitalist has been contacted for further evaluation and management of acute hypoxic respiratory failure and pneumonia in the setting of RSV infection.   Plan of care: The patient is accepted for admission for inpatient  status to Progressive unit, at [facility].  Check [email or web address] for on-call coverage.  [facility] will assume care on arrival to accepting facility. Until arrival, medical decision making responsibilities remain with the EDP.  However, [facility] available 24/7 for questions and assistance.   Nursing staff please page [facility] Admits and Consults [phone]) as soon as the patient arrives to the hospital.    Author: Tereasa Coop, MD  01/01/2024  [facility]

## 2024-01-01 NOTE — ED Notes (Signed)
Patient transported to CT

## 2024-01-01 NOTE — ED Provider Notes (Signed)
Erica [name]   CSN: [id number] Arrival date & time: 01/01/24  3557     History  Chief Complaint  Patient presents with   Cough   Shortness of Breath    Erica [name] is a 83 y.o. female.  The history is provided by the patient and medical records. No language interpreter was used.  Cough Associated symptoms: shortness of breath   Shortness of Breath Associated symptoms: cough     83 year old female history of melanoma, hypertension, anxiety, depression, CAD presents to ED for evaluation of shortness of breath.  Patient report 3 days ago she developed some sinus congestion, runny nose and cough.  Yesterday she felt more shortness of breath and went to urgent care for her symptoms.  She tested positive for RSV.  Today she states her shortness of breath has increased prompting this ER visit.  She does not endorse any fever chills no hemoptysis no nausea vomiting diarrhea no significant wheezing.  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  She also has history of melanoma on her face status post surgical resection.  She denies any history of PE.  She is currently on Plavix for prior cardiac stenting.  She does not endorse any active chest pain of pleuritic component.  Home Medications Prior to Admission medications   Medication Sig Start Date End Date Taking? Authorizing Provider  amLODipine (NORVASC) 5 MG tablet Take 5 mg by mouth daily.    [provider]  calcium citrate (CALCITRATE - DOSED IN MG ELEMENTAL CALCIUM) 950 (200 Ca) MG tablet Take by mouth.    [provider]  clopidogrel (PLAVIX) 75 MG tablet Take 1 tablet (75 mg total) by mouth daily. 05/04/21   Tonny Bollman, MD  denosumab (PROLIA) 60 MG/ML SOSY injection Inject 1 mL into the skin every 6 (six) months.    [provider]  ezetimibe (ZETIA) 10 MG tablet TAKE ONE TABLET BY MOUTH DAILY 08/03/20   Tonny Bollman, MD   isosorbide mononitrate (IMDUR) 30 MG 24 hr tablet Take 1 tablet (30 mg total) by mouth daily. 09/23/23 12/22/23  Tonny Bollman, MD  levothyroxine (SYNTHROID, LEVOTHROID) 100 MCG tablet Take 100 mcg by mouth daily before breakfast.  08/01/15   [provider]  minoxidil (LONITEN) 2.5 MG tablet Take by mouth. 07/10/23   [provider]  Multiple Vitamins-Minerals (WOMENS MULTIVITAMIN PO) Take 1 tablet by mouth daily.     [provider]  nitroGLYCERIN (NITROSTAT) 0.4 MG SL tablet Place 1 tablet (0.4 mg total) under the tongue every 5 (five) minutes as needed for chest pain. 01/09/23   Tonny Bollman, MD  rosuvastatin (CRESTOR) 40 MG tablet Take 40 mg by mouth daily.    [provider]  sertraline (ZOLOFT) 50 MG tablet Take 50 mg by mouth daily.  03/17/18   [provider]      Allergies    Boniva [ibandronic acid], Isosulfan blue, and Morphine and codeine    Review of Systems   Review of Systems  Respiratory:  Positive for cough and shortness of breath.   All other systems reviewed and are negative.   Physical Exam Updated Vital Signs BP 111/61 (BP Location: Left Arm)   Pulse 65   Temp 98.2 F (36.8 C)   Resp 20   Ht 5' 1.5" (1.562 m)   Wt 56.4 kg   SpO2 (!) 88%   BMI 23.10 kg/m  Physical Exam Vitals  and nursing [name] reviewed.  Constitutional:      General: She is not in acute distress.    Appearance: She is well-developed.     Comments: Patient laying in bed, wearing supplemental oxygen, able to speak in complete sentences.  HENT:     Head: Atraumatic.  Eyes:     Conjunctiva/sclera: Conjunctivae normal.  Neck:     Vascular: No JVD.  Cardiovascular:     Rate and Rhythm: Normal rate and regular rhythm.  Pulmonary:     Effort: Pulmonary effort is normal.     Breath sounds: Wheezing (Mild faint expiratory wheezes heard) present. No rhonchi or rales.  Musculoskeletal:     Cervical back: Neck supple.     Right lower leg: No edema.      Left lower leg: No edema.  Skin:    Capillary Refill: Capillary refill takes less than 2 seconds.     Findings: No rash.  Neurological:     Mental Status: She is alert and oriented to person, place, and time.  Psychiatric:        Mood and Affect: Mood normal.     ED Results / Procedures / Treatments   Labs (all labs ordered are listed, but only abnormal results are displayed) Labs Reviewed  BASIC METABOLIC PANEL - Abnormal; Notable for the following components:      Result Value   Glucose, Bld 104 (*)    Calcium 8.7 (*)    All other components within normal limits  RESPIRATORY PANEL BY PCR  CBC WITH DIFFERENTIAL/PLATELET    EKG EKG Interpretation Date/Time:  Wednesday January 01 2024 16:47:11 EST Ventricular Rate:  66 PR Interval:  166 QRS Duration:  78 QT Interval:  424 QTC Calculation: 444 R Axis:   73  Text Interpretation: Sinus rhythm with occasional Premature ventricular complexes Septal infarct (cited on or before 23-Sep-2023) Abnormal ECG Interpretation limited secondary to artifact Confirmed by Vonita Moss [phone]) on 01/01/2024 5:55:28 PM  Radiology CT Angio Chest PE W and/or Wo Contrast Result Date: 01/01/2024 CLINICAL DATA:  High probability for PE. EXAM: CT ANGIOGRAPHY CHEST WITH CONTRAST TECHNIQUE: Multidetector CT imaging of the chest was performed using the standard protocol during bolus administration of intravenous contrast. Multiplanar CT image reconstructions and MIPs were obtained to evaluate the vascular anatomy. RADIATION DOSE REDUCTION: This exam was performed according to the departmental dose-optimization program which includes automated exposure control, adjustment of the mA and/or kV according to patient size and/or use of iterative reconstruction technique. CONTRAST:  75mL OMNIPAQUE IOHEXOL 350 MG/ML SOLN COMPARISON:  CT chest 02/23/2010 FINDINGS: Cardiovascular: Heart is mildly enlarged. There is no pericardial effusion. Aorta is normal in  size. There are atherosclerotic calcifications of the aorta. Patient is status post cardiac surgery. There is adequate opacification of the pulmonary arteries to the segmental level. There is no pulmonary embolism identified. Mediastinum/Nodes: There is an enlarged subcarinal lymph node measuring 13 mm short axis. There are nonenlarged AP window and hilar lymph nodes. Visualized esophagus and thyroid gland are within normal limits. Lungs/Pleura: Mild emphysema present. There is scarring in both lung apices. There is bilateral lower lobe peribronchial wall thickening with mucous plugging. There some mild patchy ground-glass and airspace opacity in the bilateral lower lobes. There is no pleural effusion or pneumothorax. Upper Abdomen: No acute abnormality. Musculoskeletal: Sternotomy wires are present. No acute fractures are seen. Degenerative changes affect the spine. Review of the MIP images confirms the above findings. IMPRESSION: 1. No evidence for pulmonary embolism.  2. Bilateral lower lobe peribronchial wall thickening with mucous plugging. There is also mild patchy ground-glass and airspace opacity in the bilateral lower lobes worrisome for infection. 3. Mild cardiomegaly. 4. Mildly enlarged subcarinal lymph node, likely reactive. Aortic Atherosclerosis (ICD10-I70.0) and Emphysema (ICD10-J43.9). Electronically Signed   By: Darliss Cheney M.D.   On: 01/01/2024 20:20   DG Chest Port 1 View Result Date: 01/01/2024 CLINICAL DATA:  Shortness of breath, low O2 saturation. EXAM: PORTABLE CHEST 1 VIEW COMPARISON:  12/31/2023 and CT chest 02/23/2010. FINDINGS: Trachea is midline. Heart is at the upper limits of normal in size. Atherosclerotic calcification of the aorta. Lungs are clear. No pleural fluid. IMPRESSION: No acute findings. Electronically Signed   By: Leanna Battles M.D.   On: 01/01/2024 18:31    Procedures .Critical Care  Performed by: Fayrene Helper, PA-C Authorized by: Fayrene Helper, PA-C   Critical  care provider statement:    Critical care time (minutes):  40   Critical care was time spent personally by me on the following activities:  Development of treatment plan with patient or surrogate, discussions with consultants, evaluation of patient's response to treatment, examination of patient, ordering and review of laboratory studies, ordering and review of radiographic studies, ordering and performing treatments and interventions, pulse oximetry, re-evaluation of patient's condition and review of old charts     Medications Ordered in ED Medications  albuterol (VENTOLIN HFA) 108 (90 Base) MCG/ACT inhaler 2 puff (has no administration in time range)  cefTRIAXone (ROCEPHIN) 1 g in sodium chloride 0.9 % 100 mL IVPB (1 g Intravenous New Bag/Given 01/01/24 2056)  iohexol (OMNIPAQUE) 350 MG/ML injection 100 mL (75 mLs Intravenous Contrast Given 01/01/24 1832)  azithromycin (ZITHROMAX) tablet 500 mg (500 mg Oral Given 01/01/24 2052)    ED Course/ Medical Decision Making/ A&P                                 Medical Decision Making Amount and/or Complexity of Data Reviewed Labs: ordered. Radiology: ordered.  Risk Prescription drug management.   BP 111/61 (BP Location: Left Arm)   Pulse 65   Temp 98.2 F (36.8 C)   Resp 20   Ht 5' 1.5" (1.562 m)   Wt 56.4 kg   SpO2 (!) 88%   BMI 23.10 kg/m   41:57 PM 83 year old female history of melanoma, hypertension, anxiety, depression, CAD presents to ED for evaluation of shortness of breath.  Patient report 3 days ago she developed some sinus congestion, runny nose and cough.  Yesterday she felt more shortness of breath and went to urgent care for her symptoms.  She tested positive for RSV.  Today she states her shortness of breath has increased prompting this ER visit.  She does not endorse any fever chills no hemoptysis no nausea vomiting diarrhea no significant wheezing.  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  She also  has history of melanoma on her face status post surgical resection.  She denies any history of PE.  She is currently on Plavix for prior cardiac stenting.  She does not endorse any active chest pain of pleuritic component.  On exam, elderly female laying in bed wearing supplemental oxygen, able to speak in complete sentences.  Exam notable for faint expiratory wheezes without other significant adventitious breath sounds.  No evidence of peripheral edema to suggest CHF.  Patient initially was hypoxic with an O2 sats  of 84% on room air, improved to 97% on 3 L of supplemental oxygen.  This is likely an acute respiratory failure with hypoxia in the setting of RSV.  However, patient has remote history of DVT, as well as history of cancer which puts her at a high risk for developing PE.  Will obtain chest CT angiogram to rule out active PE.  Breathing treatment provided.  Care discussed with Dr. Eloise Harman.  -Labs ordered, independently viewed and interpreted by me.  Labs remarkable for normal lab values.  EMR reviewed, pt tested positive for RSV yesterday -The patient was maintained on a cardiac monitor.  I personally viewed and interpreted the cardiac monitored which showed an underlying rhythm of: NSR -Imaging independently viewed and interpreted by me and I agree with radiologist's interpretation.  Result remarkable for chest CTA showing no PE but evidence of pna.  This is likely viral infection, but given hypoxia will initiate abx including rocephin/zithromax -This patient presents to the ED for concern of sob, this involves an extensive number of treatment options, and is a complaint that carries with it a high risk of complications and morbidity.  The differential diagnosis includes covid, flu, rsv, pneumonia, pe -Co morbidities that complicate the patient evaluation includes HTN, anxiety, CAD -Treatment includes duonebs, rocephin, zithromax, supplemental O2 -Reevaluation of the patient after these  medicines showed that the patient improved -PCP office notes or outside notes reviewed -Discussion with specialist [facility] Dr. Janalyn Shy who agrees to admit pt.  She request for a 20plex viral resp panel -Escalation to admission/observation considered: patient is agreeable with hospital admission to [facility] .        Final Clinical Impression(s) / ED Diagnoses Final diagnoses:  Acute hypoxemic respiratory failure (HCC)  RSV (acute bronchiolitis due to respiratory syncytial virus)    Rx / DC Orders ED Discharge Orders     None         Fayrene Helper, PA-C 01/01/24 2113    Rondel Baton, MD 01/06/24 [phone]

## 2024-01-01 NOTE — ED Notes (Signed)
Pt placed on 3L O2 via [place] in triage.

## 2024-01-01 NOTE — ED Notes (Signed)
Pt. Ambulated down hall to BR and back. Pt. Denies dizziness. Pt. Pulse upon return to bed was 86% RA. Pt. Placed back on 2LNC. Pusle ox up to 94%.

## 2024-01-01 NOTE — ED Triage Notes (Signed)
Pt via pov from home with low O2 saturation at home. She reports that she went to UC yesterday with cough and sob; has been checking her oxygenation at home since then. Pt had RSV positive test at walk in clinic yesterday. Pt alert & oriented, nad noted.

## 2024-01-02 ENCOUNTER — Encounter (HOSPITAL_COMMUNITY): Payer: Self-pay | Admitting: Internal Medicine

## 2024-01-02 DIAGNOSIS — J9601 Acute respiratory failure with hypoxia: Secondary | ICD-10-CM

## 2024-01-02 DIAGNOSIS — J210 Acute bronchiolitis due to respiratory syncytial virus: Secondary | ICD-10-CM

## 2024-01-02 DIAGNOSIS — I10 Essential (primary) hypertension: Secondary | ICD-10-CM

## 2024-01-02 DIAGNOSIS — I2510 Atherosclerotic heart disease of native coronary artery without angina pectoris: Secondary | ICD-10-CM

## 2024-01-02 DIAGNOSIS — J189 Pneumonia, unspecified organism: Secondary | ICD-10-CM | POA: Insufficient documentation

## 2024-01-02 LAB — CBC WITH DIFFERENTIAL/PLATELET
Abs Immature Granulocytes: 0.02 10*3/uL (ref 0.00–0.07)
Basophils Absolute: 0 10*3/uL (ref 0.0–0.1)
Basophils Relative: 1 %
Eosinophils Absolute: 0.1 10*3/uL (ref 0.0–0.5)
Eosinophils Relative: 3 %
HCT: 35.9 % — ABNORMAL LOW (ref 36.0–46.0)
Hemoglobin: 12 g/dL (ref 12.0–15.0)
Immature Granulocytes: 1 %
Lymphocytes Relative: 37 %
Lymphs Abs: 1.6 10*3/uL (ref 0.7–4.0)
MCH: 29.7 pg (ref 26.0–34.0)
MCHC: 33.4 g/dL (ref 30.0–36.0)
MCV: 88.9 fL (ref 80.0–100.0)
Monocytes Absolute: 0.6 10*3/uL (ref 0.1–1.0)
Monocytes Relative: 14 %
Neutro Abs: 2 10*3/uL (ref 1.7–7.7)
Neutrophils Relative %: 44 %
Platelets: 193 10*3/uL (ref 150–400)
RBC: 4.04 MIL/uL (ref 3.87–5.11)
RDW: 13.1 % (ref 11.5–15.5)
WBC: 4.4 10*3/uL (ref 4.0–10.5)
nRBC: 0 % (ref 0.0–0.2)

## 2024-01-02 LAB — RESPIRATORY PANEL BY PCR

## 2024-01-02 LAB — COMPREHENSIVE METABOLIC PANEL
ALT: 17 U/L (ref 0–44)
AST: 20 U/L (ref 15–41)
Albumin: 3.4 g/dL — ABNORMAL LOW (ref 3.5–5.0)
Alkaline Phosphatase: 46 U/L (ref 38–126)
Anion gap: 8 (ref 5–15)
BUN: 18 mg/dL (ref 8–23)
CO2: 23 mmol/L (ref 22–32)
Calcium: 8.1 mg/dL — ABNORMAL LOW (ref 8.9–10.3)
Chloride: 106 mmol/L (ref 98–111)
Creatinine, Ser: 0.75 mg/dL (ref 0.44–1.00)
GFR, Estimated: >60 mL/min (ref >60)
Glucose, Bld: 89 mg/dL (ref 70–99)
Potassium: 3.4 mmol/L — ABNORMAL LOW (ref 3.5–5.1)
Sodium: 137 mmol/L (ref 135–145)
Total Bilirubin: 0.7 mg/dL (ref 0.0–1.2)
Total Protein: 6 g/dL — ABNORMAL LOW (ref 6.5–8.1)

## 2024-01-02 LAB — SARS CORONAVIRUS 2 BY RT PCR: SARS Coronavirus 2 by RT PCR: NEGATIVE

## 2024-01-02 LAB — TROPONIN I (HIGH SENSITIVITY): Troponin I (High Sensitivity): 8 ng/L (ref <18)

## 2024-01-02 LAB — MAGNESIUM: Magnesium: 2.2 mg/dL (ref 1.7–2.4)

## 2024-01-02 LAB — TSH: TSH: 1.798 u[IU]/mL (ref 0.350–4.500)

## 2024-01-02 LAB — BRAIN NATRIURETIC PEPTIDE: B Natriuretic Peptide: 90.5 pg/mL (ref 0.0–100.0)

## 2024-01-02 MED ORDER — ISOSORBIDE MONONITRATE ER 30 MG PO TB24
30.0000 mg | ORAL_TABLET | Freq: Every day | ORAL | Status: DC
  Administered 2024-01-02 – 2024-01-03 (×2): 30 mg via ORAL
  Filled 2024-01-02 (×2): qty 1

## 2024-01-02 MED ORDER — MINOXIDIL 2.5 MG PO TABS
2.5000 mg | ORAL_TABLET | Freq: Every day | ORAL | Status: DC
  Administered 2024-01-02 – 2024-01-03 (×2): 2.5 mg via ORAL
  Filled 2024-01-02 (×2): qty 1

## 2024-01-02 MED ORDER — SODIUM CHLORIDE 0.9 % IV SOLN
500.0000 mg | INTRAVENOUS | Status: DC
  Administered 2024-01-02: 500 mg via INTRAVENOUS
  Filled 2024-01-02: qty 5

## 2024-01-02 MED ORDER — NITROGLYCERIN 0.4 MG SL SUBL: 0.4000 mg | SUBLINGUAL_TABLET | SUBLINGUAL | Status: DC | PRN

## 2024-01-02 MED ORDER — AMLODIPINE BESYLATE 10 MG PO TABS
5.0000 mg | ORAL_TABLET | Freq: Every day | ORAL | Status: DC
  Administered 2024-01-02 – 2024-01-03 (×2): 5 mg via ORAL
  Filled 2024-01-02 (×2): qty 1

## 2024-01-02 MED ORDER — ROSUVASTATIN CALCIUM 20 MG PO TABS
40.0000 mg | ORAL_TABLET | Freq: Every day | ORAL | Status: DC
  Administered 2024-01-02 – 2024-01-03 (×2): 40 mg via ORAL
  Filled 2024-01-02 (×2): qty 2

## 2024-01-02 MED ORDER — EZETIMIBE 10 MG PO TABS
10.0000 mg | ORAL_TABLET | Freq: Every day | ORAL | Status: DC
  Administered 2024-01-02 – 2024-01-03 (×2): 10 mg via ORAL
  Filled 2024-01-02 (×2): qty 1

## 2024-01-02 MED ORDER — SALINE SPRAY 0.65 % NA SOLN
1.0000 | NASAL | Status: DC | PRN
  Administered 2024-01-02: 1 via NASAL
  Filled 2024-01-02: qty 44

## 2024-01-02 MED ORDER — ACETAMINOPHEN 650 MG RE SUPP
650.0000 mg | Freq: Four times a day (QID) | RECTAL | Status: DC | PRN
Start: 2024-01-02 — End: 2024-01-03

## 2024-01-02 MED ORDER — CLOPIDOGREL BISULFATE 75 MG PO TABS
75.0000 mg | ORAL_TABLET | Freq: Every day | ORAL | Status: DC
  Administered 2024-01-02 – 2024-01-03 (×2): 75 mg via ORAL
  Filled 2024-01-02 (×2): qty 1

## 2024-01-02 MED ORDER — SERTRALINE HCL 50 MG PO TABS
50.0000 mg | ORAL_TABLET | Freq: Every day | ORAL | Status: DC
  Administered 2024-01-02 – 2024-01-03 (×2): 50 mg via ORAL
  Filled 2024-01-02 (×2): qty 1

## 2024-01-02 MED ORDER — LEVOTHYROXINE SODIUM 100 MCG PO TABS
100.0000 ug | ORAL_TABLET | Freq: Every day | ORAL | Status: DC
  Administered 2024-01-02 – 2024-01-03 (×2): 100 ug via ORAL
  Filled 2024-01-02 (×2): qty 1

## 2024-01-02 MED ORDER — ENOXAPARIN SODIUM 40 MG/0.4ML IJ SOSY
40.0000 mg | PREFILLED_SYRINGE | INTRAMUSCULAR | Status: DC
Start: 2024-01-02 — End: 2024-01-02
  Administered 2024-01-02: 40 mg via SUBCUTANEOUS
  Filled 2024-01-02: qty 0.4

## 2024-01-02 MED ORDER — SODIUM CHLORIDE 0.9 % IV SOLN
2.0000 g | INTRAVENOUS | Status: DC
  Administered 2024-01-02: 2 g via INTRAVENOUS
  Filled 2024-01-02: qty 20

## 2024-01-02 MED ORDER — ACETAMINOPHEN 325 MG PO TABS: 650.0000 mg | ORAL_TABLET | Freq: Four times a day (QID) | ORAL | Status: DC | PRN

## 2024-01-02 NOTE — Progress Notes (Signed)
  Progress Note   Patient: Erica [name] WGN:[id number] DOB: 06-30-1941 DOA: 01/01/2024     1 DOS: the patient was seen and examined on 01/02/2024   Brief hospital course: 83 year old woman presented with shortness of breath.  Admitted for hypoxia and RSV pneumonia.  Consultants None   Procedures/Events None   Assessment and Plan: Acute respiratory failure with hypoxia secondary to bilateral pneumonia in the setting of RSV infection presently on Zithromax and ceftriaxone.  Improving.  CAD status post CABG and PCI denies any chest pain.  Continue with antiplatelet agents statins Zetia and Imdur.  Not on beta-blockers due to history of bradycardia.  PAD status post bilateral iliac artery stenting continue statins and antiplatelet agents.  Hypertension on amlodipine and Imdur.  Hypothyroidism on Synthroid.  History of anxiety and depression on Zoloft.         Subjective:  Feels ok  Physical Exam: Vitals:   01/01/24 2307 01/02/24 0320 01/02/24 0817 01/02/24 1335  BP: 132/64 (!) 116/56 (!) 116/48 (!) 107/50  Pulse: (!) 56 (!) 53 (!) 48 (!) 57  Resp: 20 19 20  (!) 22  Temp: 98.4 F (36.9 C) 98.4 F (36.9 C) 98.4 F (36.9 C) 98 F (36.7 C)  TempSrc: Oral Oral Oral Oral  SpO2: 97% 95% 94% 95%  Weight:      Height:       Physical Exam Vitals reviewed.  Constitutional:      General: She is not in acute distress.    Appearance: She is not ill-appearing or toxic-appearing.  Cardiovascular:     Rate and Rhythm: Normal rate and regular rhythm.     Heart sounds: No murmur heard. Pulmonary:     Effort: Pulmonary effort is normal. No respiratory distress.     Breath sounds: No wheezing, rhonchi or rales.  Neurological:     Mental Status: She is alert.  Psychiatric:        Mood and Affect: Mood normal.     Data Reviewed: K+ 3.4 CBC stable  Family Communication: none  Disposition: Status is: Inpatient Remains inpatient appropriate because: RSV     Time  spent: 20 minutes  Author: Brendia Sacks, MD 01/02/2024 6:12 PM  For on call review [email or web address].

## 2024-01-02 NOTE — Hospital Course (Addendum)
83 year old woman presented with shortness of breath.  Admitted for hypoxia and RSV pneumonia.  Consultants None   Procedures/Events None

## 2024-01-02 NOTE — H&P (Signed)
History and Physical    Erica [name] HQI:[id number] DOB: [birth year] DOA: 01/01/2024  Patient coming from: Home.  Chief Complaint: Shortness of breath.  HPI: Erica [name] is a 83 y.o. female with with history of CAD status post CABG and PCI, peripheral artery disease status post bilateral iliac artery stenting in 2019, history of melanoma, hypothyroidism, hypertension presents to the ER with complaints of worsening shortness of breath for the last 3 days with cough.  Denies any chest pain.  Patient had gone to urgent care and was diagnosed with RSV infection day before and since symptoms did not get better patient presents to the ER.  ED Course: In the ER patient was hypoxic requiring 2 L oxygen.  CT angiogram of the chest was done which was negative for PE but did show bilateral pneumonia.  Patient was started on empiric antibiotics and admitted for further observation.  Review of Systems: As per HPI, rest all negative.   Past Medical History:  Diagnosis Date   Anxiety    Chronic mid back pain    Coronary artery disease    status post multiple percutaneous interventions // Echocardiogram 10/22: EF 60-65, no RWMA, normal RVSF, RVSP 27.6, trivial MR   Depression    History of kidney stones 1990s X 1; 2016    Hyperlipidemia    Hypertension    Hypothyroidism    Myocardial infarction (HCC) 01/2010   Osteoarthritis    "hands, back" (10/15/2018)    Past Surgical History:  Procedure Laterality Date   ABDOMINAL AORTOGRAM W/LOWER EXTREMITY  10/15/2018   ABDOMINAL AORTOGRAM W/LOWER EXTREMITY N/A 10/15/2018   Procedure: ABDOMINAL AORTOGRAM W/LOWER EXTREMITY;  Surgeon: Iran Ouch, MD;  Location: [facility]IVE [facility];  Service: Cardiovascular;  Laterality: N/A;   CARDIAC CATHETERIZATION  12-2008   CATARACT EXTRACTION W/ INTRAOCULAR LENS IMPLANT Bilateral    CORONARY ANGIOPLASTY WITH STENT PLACEMENT  2006    right coronary artery with drug-eluting stent   CORONARY ANGIOPLASTY WITH  STENT PLACEMENT  1998    bare-metal stent to the right coronary artery   CORONARY ANGIOPLASTY WITH STENT PLACEMENT  2008   drug-eluting stent placed in the left circumflex   CORONARY ARTERY BYPASS GRAFT  01-25-10   CABG X4   CORONARY ATHERECTOMY N/A 07/16/2018   Procedure: CORONARY ATHERECTOMY;  Surgeon: Tonny Bollman, MD;  Location: [facility] INVASIVE CV LAB;  Service: Cardiovascular;  Laterality: N/A;   CORONARY BALLOON ANGIOPLASTY N/A 06/04/2018   Procedure: CORONARY BALLOON ANGIOPLASTY;  Surgeon: Tonny Bollman, MD;  Location: [facility] INVASIVE CV LAB;  Service: Cardiovascular;  Laterality: N/A;   CORONARY PRESSURE/FFR STUDY N/A 06/04/2018   Procedure: INTRAVASCULAR PRESSURE WIRE/FFR STUDY;  Surgeon: Tonny Bollman, MD;  Location: [facility] INVASIVE CV LAB;  Service: Cardiovascular;  Laterality: N/A;   CYSTOSCOPY WITH RETROGRADE PYELOGRAM, URETEROSCOPY AND STENT PLACEMENT Right 10/18/2015   Procedure: CYSTOSCOPY WITH RETROGRADE PYELOGRAM,  AND STENT PLACEMENT;  Surgeon: Jerilee Field, MD;  Location: [facility];  Service: Urology;  Laterality: Right;   CYSTOSCOPY/URETEROSCOPY/HOLMIUM LASER/STENT PLACEMENT Right 11/29/2015   Procedure: RIGHT URETEROSCOPY/RIGHT RETROGRADE PYELOGRAM/ RIGHT STENT REPLACEMENT;  Surgeon: Jerilee Field, MD;  Location: [facility];  Service: Urology;  Laterality: Right;   FOOT SURGERY Bilateral 2008-2016   "bones shortened prox to  great toes"   HAMMER TOE SURGERY Right 2008   2nd and 3rd digits   LEFT HEART CATH AND CORS/GRAFTS ANGIOGRAPHY N/A 06/04/2018   Procedure: LEFT HEART CATH AND CORS/GRAFTS ANGIOGRAPHY;  Surgeon: Tonny Bollman, MD;  Location: [facility] INV[facility]IVE CV [facility];  Service: Cardiovascular;  Laterality: N/A;   PERIPHERAL VASCULAR INTERVENTION Bilateral 10/15/2018   PERIPHERAL VASCULAR INTERVENTION Bilateral 10/15/2018   Procedure: PERIPHERAL VASCULAR INTERVENTION;  Surgeon: Iran Ouch, MD;  Location: [facility] INV[facility] CV [facility];  Service: Cardiovascular;   Laterality: Bilateral;   ULTRASOUND GUIDANCE FOR VASCULAR ACCESS  06/04/2018   Procedure: Ultrasound Guidance For Vascular Access;  Surgeon: Tonny Bollman, MD;  Location: [facility] INVASIVE CV LAB;  Service: Cardiovascular;;     reports that she quit smoking about 27 years ago. Her smoking use included cigarettes. She started smoking about 64 years ago. She has a 37 pack-year smoking history. She has never used smokeless tobacco. She reports that she does not currently use alcohol. She reports that she does not use drugs.  Allergies  Allergen Reactions   Boniva [Ibandronic Acid]    Isosulfan Blue    Morphine And Codeine Nausea And Vomiting    Family History  Problem Relation Age of Onset   Other Mother 2       accident   Other Other        Patient was adopted and has no information about her siblings    Prior to Admission medications   Medication Sig Start Date End Date Taking? Authorizing Provider  amLODipine (NORVASC) 5 MG tablet Take 5 mg by mouth daily.    [provider]  calcium citrate (CALCITRATE - DOSED IN MG ELEMENTAL CALCIUM) 950 (200 Ca) MG tablet Take by mouth.    [provider]  clopidogrel (PLAVIX) 75 MG tablet Take 1 tablet (75 mg total) by mouth daily. 05/04/21   Tonny Bollman, MD  denosumab (PROLIA) 60 MG/ML SOSY injection Inject 1 mL into the skin every 6 (six) months.    [provider]  ezetimibe (ZETIA) 10 MG tablet TAKE ONE TABLET BY MOUTH DAILY 08/03/20   Tonny Bollman, MD  isosorbide mononitrate (IMDUR) 30 MG 24 hr tablet Take 1 tablet (30 mg total) by mouth daily. 09/23/23 12/22/23  Tonny Bollman, MD  levothyroxine (SYNTHROID, LEVOTHROID) 100 MCG tablet Take 100 mcg by mouth daily before breakfast.  08/01/15   [provider]  minoxidil (LONITEN) 2.5 MG tablet Take by mouth. 07/10/23   [provider]  Multiple Vitamins-Minerals (WOMENS MULTIVITAMIN PO) Take 1 tablet by mouth daily.     [provider]   nitroGLYCERIN (NITROSTAT) 0.4 MG SL tablet Place 1 tablet (0.4 mg total) under the tongue every 5 (five) minutes as needed for chest pain. 01/09/23   Tonny Bollman, MD  rosuvastatin (CRESTOR) 40 MG tablet Take 40 mg by mouth daily.    [provider]  sertraline (ZOLOFT) 50 MG tablet Take 50 mg by mouth daily.  03/17/18   [provider]    Physical Exam: Constitutional: Moderately built and nourished. Vitals:   01/01/24 2021 01/01/24 2234 01/01/24 2240 01/01/24 2307  BP:   (!) 148/62 132/64  Pulse: 60  (!) 57 (!) 56  Resp:   16 20  Temp:  98.7 F (37.1 C)  98.4 F (36.9 C)  TempSrc:  Oral  Oral  SpO2: 95%  95% 97%  Weight:      Height:       Eyes: Anicteric no pallor. ENMT: No discharge from the ears eyes nose or mouth. Neck: No mass felt.  No neck rigidity. Respiratory: No rhonchi or crepitations. Cardiovascular: S1 S2 heard. Abdomen: Soft nontender bowel sounds present. Musculoskeletal: No edema. Skin: No rash. Neurologic: Alert  awake oriented time place and person.  Moves all extremities. Psychiatric: Appears normal.  Normal affect.   Labs on Admission: I have personally reviewed following labs and imaging studies  CBC: Recent Labs  Lab 01/01/24 1721  WBC 5.0  NEUTROABS 3.0  HGB 12.6  HCT 37.7  MCV 87.5  PLT 194   Basic Metabolic Panel: Recent Labs  Lab 01/01/24 1721  NA 138  K 3.7  CL 103  CO2 27  GLUCOSE 104*  BUN 18  CREATININE 0.87  CALCIUM 8.7*   GFR: Estimated Creatinine Clearance: 38.6 mL/min (by C-G formula based on SCr of 0.87 mg/dL). Liver Function Tests: No results for input(s): "AST", "ALT", "ALKPHOS", "BILITOT", "PROT", "ALBUMIN" in the last 168 hours. No results for input(s): "LIPASE", "AMYLASE" in the last 168 hours. No results for input(s): "AMMONIA" in the last 168 hours. Coagulation Profile: No results for input(s): "INR", "PROTIME" in the last 168 hours. Cardiac Enzymes: No results for input(s): "CKTOTAL",  "CKMB", "CKMBINDEX", "TROPONINI" in the last 168 hours. BNP (last 3 results) No results for input(s): "PROBNP" in the last 8760 hours. HbA1C: No results for input(s): "HGBA1C" in the last 72 hours. CBG: No results for input(s): "GLUCAP" in the last 168 hours. Lipid Profile: No results for input(s): "CHOL", "HDL", "LDLCALC", "TRIG", "CHOLHDL", "LDLDIRECT" in the last 72 hours. Thyroid Function Tests: No results for input(s): "TSH", "T4TOTAL", "FREET4", "T3FREE", "THYROIDAB" in the last 72 hours. Anemia Panel: No results for input(s): "VITAMINB12", "FOLATE", "FERRITIN", "TIBC", "IRON", "RETICCTPCT" in the last 72 hours. Urine analysis:    Component Value Date/Time   COLORURINE YELLOW 10/17/2015 1547   APPEARANCEUR TURBID (A) 10/17/2015 1547   LABSPEC 1.014 10/17/2015 1547   PHURINE 5.5 10/17/2015 1547   GLUCOSEU NEGATIVE 10/17/2015 1547   HGBUR MODERATE (A) 10/17/2015 1547   BILIRUBINUR NEGATIVE 10/17/2015 1547   KETONESUR NEGATIVE 10/17/2015 1547   PROTEINUR 100 (A) 10/17/2015 1547   UROBILINOGEN 0.2 10/17/2015 1547   NITRITE NEGATIVE 10/17/2015 1547   LEUKOCYTESUR MODERATE (A) 10/17/2015 1547   Sepsis Labs: @LABRCNTIP (procalcitonin:4,lacticidven:4) )No results found for this or any previous visit (from the past 240 hours).   Radiological Exams on Admission: CT Angio Chest PE W and/or Wo Contrast Result Date: 01/01/2024 CLINICAL DATA:  High probability for PE. EXAM: CT ANGIOGRAPHY CHEST WITH CONTRAST TECHNIQUE: Multidetector CT imaging of the chest was performed using the standard protocol during bolus administration of intravenous contrast. Multiplanar CT image reconstructions and MIPs were obtained to evaluate the vascular anatomy. RADIATION DOSE REDUCTION: This exam was performed according to the departmental dose-optimization program which includes automated exposure control, adjustment of the mA and/or kV according to patient size and/or use of iterative reconstruction  technique. CONTRAST:  75mL OMNIPAQUE IOHEXOL 350 MG/ML SOLN COMPARISON:  CT chest 02/23/2010 FINDINGS: Cardiovascular: Heart is mildly enlarged. There is no pericardial effusion. Aorta is normal in size. There are atherosclerotic calcifications of the aorta. Patient is status post cardiac surgery. There is adequate opacification of the pulmonary arteries to the segmental level. There is no pulmonary embolism identified. Mediastinum/Nodes: There is an enlarged subcarinal lymph node measuring 13 mm short axis. There are nonenlarged AP window and hilar lymph nodes. Visualized esophagus and thyroid gland are within normal limits. Lungs/Pleura: Mild emphysema present. There is scarring in both lung apices. There is bilateral lower lobe peribronchial wall thickening with mucous plugging. There some mild patchy ground-glass and airspace opacity in the bilateral lower lobes. There is no pleural effusion or pneumothorax. Upper Abdomen: No acute  abnormality. Musculoskeletal: Sternotomy wires are present. No acute fractures are seen. Degenerative changes affect the spine. Review of the MIP images confirms the above findings. IMPRESSION: 1. No evidence for pulmonary embolism. 2. Bilateral lower lobe peribronchial wall thickening with mucous plugging. There is also mild patchy ground-glass and airspace opacity in the bilateral lower lobes worrisome for infection. 3. Mild cardiomegaly. 4. Mildly enlarged subcarinal lymph node, likely reactive. Aortic Atherosclerosis (ICD10-I70.0) and Emphysema (ICD10-J43.9). Electronically Signed   By: Darliss Cheney M.D.   On: 01/01/2024 20:20   DG Chest Port 1 View Result Date: 01/01/2024 CLINICAL DATA:  Shortness of breath, low O2 saturation. EXAM: PORTABLE CHEST 1 VIEW COMPARISON:  12/31/2023 and CT chest 02/23/2010. FINDINGS: Trachea is midline. Heart is at the upper limits of normal in size. Atherosclerotic calcification of the aorta. Lungs are clear. No pleural fluid. IMPRESSION: No  acute findings. Electronically Signed   By: Leanna Battles M.D.   On: 01/01/2024 18:31    EKG: Independently reviewed.  Normal sinus rhythm.  Assessment/Plan Principal Problem:   Acute hypoxic respiratory failure (HCC) Active Problems:   Essential hypertension   CAD, NATIVE VESSEL   PAD (peripheral artery disease) (HCC)   Acute respiratory failure with hypoxia (HCC)    Acute respiratory failure with hypoxia secondary to bilateral pneumonia in the setting of RSV infection presently on Zithromax and ceftriaxone.  COVID test is pending.   CAD status post CABG and PCI denies any chest pain.  Continue with antiplatelet agents statins Zetia and Imdur.  Not on beta-blockers due to history of bradycardia. History of peripheral artery disease status post bilateral iliac artery stenting continue statins and antiplatelet agents. Hypertension on amlodipine and Imdur. Hypothyroidism on Synthroid. History of anxiety and depression on Zoloft.  Since patient has acute respiratory failure with hypoxia with bilateral pneumonia will need close monitoring and more than 2 midnight stay in inpatient status.   DVT prophylaxis: Lovenox. Code Status: Full code. Family Communication: Discussed with patient. Disposition Plan: Medical floor. Consults called: None. Admission status: Inpatient.

## 2024-01-03 DIAGNOSIS — J121 Respiratory syncytial virus pneumonia: Secondary | ICD-10-CM | POA: Diagnosis not present

## 2024-01-03 DIAGNOSIS — J9601 Acute respiratory failure with hypoxia: Secondary | ICD-10-CM | POA: Diagnosis not present

## 2024-01-03 DIAGNOSIS — J189 Pneumonia, unspecified organism: Secondary | ICD-10-CM

## 2024-01-03 LAB — PROCALCITONIN: Procalcitonin: <0.1 ng/mL

## 2024-01-03 MED ORDER — AZITHROMYCIN 250 MG PO TABS: 500.0000 mg | ORAL_TABLET | Freq: Every day | ORAL | Status: DC

## 2024-01-03 NOTE — Discharge Summary (Signed)
Physician Discharge Summary   Patient: Erica [name] MRN: [id number] DOB: [birth year]  Admit date:     01/01/2024  Discharge date: 01/03/24  Discharge Physician: Brendia Sacks   PCP: Lupita Raider, MD   Recommendations at discharge:   Resolution of RSV  Discharge Diagnoses: Principal Problem:   Acute hypoxic respiratory failure (HCC) Active Problems:   Essential hypertension   CAD, NATIVE VESSEL   PAD (peripheral artery disease) (HCC)   Acute respiratory failure with hypoxia (HCC)   CAP (community acquired pneumonia)   RSV (respiratory syncytial virus pneumonia)  Resolved Problems:   * No resolved hospital problems. *  Hospital Course: 83 year old woman presented with shortness of breath.  Admitted for hypoxia and RSV pneumonia.  Consultants None   Procedures/Events None   Acute respiratory failure with hypoxia secondary to RSV bilateral pneumonia Procalcitonin <0.10. Stop abx. Much improved, respiratory back to baseline Hypoxia resolved. Writer personally tested oxygenation on RA -- 94% at rest, 93-94% with ambulation.   CAD status post CABG and PCI Stable. Continue with antiplatelet agents statins Zetia and Imdur.  Not on beta-blockers due to history of bradycardia.   PAD status post bilateral iliac artery stenting continue statins and antiplatelet agents.   Hypertension on amlodipine and Imdur.   Hypothyroidism on Synthroid.   History of anxiety and depression on Zoloft.   Disposition: Home Diet recommendation:  Discharge Diet Orders (From admission, onward)     Start     Ordered   01/03/24 0000  Diet general        01/03/24 1333           Regular diet DISCHARGE MEDICATION: Allergies as of 01/03/2024       Reactions   Boniva [ibandronic Acid]    Unknown    Isosulfan Blue    Unknown    Morphine And Codeine Nausea And Vomiting        Medication List     TAKE these medications    acetaminophen 500 MG tablet Commonly known as:  TYLENOL Take 1,000 mg by mouth every 6 (six) hours as needed for mild pain (pain score 1-3) or moderate pain (pain score 4-6).   amLODipine 5 MG tablet Commonly known as: NORVASC Take 5 mg by mouth daily.   calcium citrate 950 (200 Ca) MG tablet Commonly known as: CALCITRATE - dosed in mg elemental calcium Take by mouth.   clopidogrel 75 MG tablet Commonly known as: PLAVIX Take 1 tablet (75 mg total) by mouth daily.   ezetimibe 10 MG tablet Commonly known as: ZETIA TAKE ONE TABLET BY MOUTH DAILY   isosorbide mononitrate 30 MG 24 hr tablet Commonly known as: IMDUR Take 1 tablet (30 mg total) by mouth daily.   levothyroxine 100 MCG tablet Commonly known as: SYNTHROID Take 100 mcg by mouth daily before breakfast.   minoxidil 2.5 MG tablet Commonly known as: LONITEN Take 0.5 tablets by mouth daily.   nitroGLYCERIN 0.4 MG SL tablet Commonly known as: NITROSTAT Place 1 tablet (0.4 mg total) under the tongue every 5 (five) minutes as needed for chest pain.   Prolia 60 MG/ML Sosy injection Generic drug: denosumab Inject 1 mL into the skin every 6 (six) months.   rosuvastatin 40 MG tablet Commonly known as: CRESTOR Take 40 mg by mouth daily.   sertraline 50 MG tablet Commonly known as: ZOLOFT Take 50 mg by mouth daily.   WOMENS MULTIVITAMIN PO Take 1 tablet by mouth daily.  Follow-up Information     Lupita Raider, MD. Schedule an appointment as soon as possible for a visit in 1 week(s).   Specialty: Family Medicine Why: As needed Contact information: [address] [place] [address] [phone]                Feels better Breathing back to normal  Discharge Exam: Filed Weights   01/01/24 1638  Weight: 56.4 kg   Physical Exam Vitals reviewed.  Constitutional:      General: She is not in acute distress.    Appearance: She is not ill-appearing or toxic-appearing.  Cardiovascular:     Rate and Rhythm: Normal rate and  regular rhythm.     Heart sounds: No murmur heard. Pulmonary:     Effort: Pulmonary effort is normal. No respiratory distress.     Breath sounds: No wheezing, rhonchi or rales.  Neurological:     Mental Status: She is alert.  Psychiatric:        Mood and Affect: Mood normal.        Behavior: Behavior normal.      Condition at discharge: good  The results of significant diagnostics from this hospitalization (including imaging, microbiology, ancillary and laboratory) are listed below for reference.   Imaging Studies: CT Angio Chest PE W and/or Wo Contrast Result Date: 01/01/2024 CLINICAL DATA:  High probability for PE. EXAM: CT ANGIOGRAPHY CHEST WITH CONTRAST TECHNIQUE: Multidetector CT imaging of the chest was performed using the standard protocol during bolus administration of intravenous contrast. Multiplanar CT image reconstructions and MIPs were obtained to evaluate the vascular anatomy. RADIATION DOSE REDUCTION: This exam was performed according to the departmental dose-optimization program which includes automated exposure control, adjustment of the mA and/or kV according to patient size and/or use of iterative reconstruction technique. CONTRAST:  75mL OMNIPAQUE IOHEXOL 350 MG/ML SOLN COMPARISON:  CT chest 02/23/2010 FINDINGS: Cardiovascular: Heart is mildly enlarged. There is no pericardial effusion. Aorta is normal in size. There are atherosclerotic calcifications of the aorta. Patient is status post cardiac surgery. There is adequate opacification of the pulmonary arteries to the segmental level. There is no pulmonary embolism identified. Mediastinum/Nodes: There is an enlarged subcarinal lymph node measuring 13 mm short axis. There are nonenlarged AP window and hilar lymph nodes. Visualized esophagus and thyroid gland are within normal limits. Lungs/Pleura: Mild emphysema present. There is scarring in both lung apices. There is bilateral lower lobe peribronchial wall thickening with  mucous plugging. There some mild patchy ground-glass and airspace opacity in the bilateral lower lobes. There is no pleural effusion or pneumothorax. Upper Abdomen: No acute abnormality. Musculoskeletal: Sternotomy wires are present. No acute fractures are seen. Degenerative changes affect the spine. Review of the MIP images confirms the above findings. IMPRESSION: 1. No evidence for pulmonary embolism. 2. Bilateral lower lobe peribronchial wall thickening with mucous plugging. There is also mild patchy ground-glass and airspace opacity in the bilateral lower lobes worrisome for infection. 3. Mild cardiomegaly. 4. Mildly enlarged subcarinal lymph node, likely reactive. Aortic Atherosclerosis (ICD10-I70.0) and Emphysema (ICD10-J43.9). Electronically Signed   By: Darliss Cheney M.D.   On: 01/01/2024 20:20   DG Chest Port 1 View Result Date: 01/01/2024 CLINICAL DATA:  Shortness of breath, low O2 saturation. EXAM: PORTABLE CHEST 1 VIEW COMPARISON:  12/31/2023 and CT chest 02/23/2010. FINDINGS: Trachea is midline. Heart is at the upper limits of normal in size. Atherosclerotic calcification of the aorta. Lungs are clear. No pleural fluid. IMPRESSION: No acute findings.  Electronically Signed   By: Leanna Battles M.D.   On: 01/01/2024 18:31    Microbiology: Results for orders placed or performed during the hospital encounter of 01/01/24  Respiratory (~20 pathogens) panel by PCR     Status: Abnormal   Collection Time: 01/01/24  6:20 AM   Specimen: Nasopharyngeal Swab; Respiratory  Result Value Ref Range Status   Adenovirus NOT DETECTED NOT DETECTED Final   Coronavirus 229E NOT DETECTED NOT DETECTED Final    Comment: (NOTE) The Coronavirus on the Respiratory Panel, DOES NOT test for the novel  Coronavirus (2019 nCoV)    Coronavirus HKU1 NOT DETECTED NOT DETECTED Final   Coronavirus NL63 NOT DETECTED NOT DETECTED Final   Coronavirus OC43 NOT DETECTED NOT DETECTED Final   Metapneumovirus NOT DETECTED NOT  DETECTED Final   Rhinovirus / Enterovirus NOT DETECTED NOT DETECTED Final   Influenza A NOT DETECTED NOT DETECTED Final   Influenza B NOT DETECTED NOT DETECTED Final   Parainfluenza Virus 1 NOT DETECTED NOT DETECTED Final   Parainfluenza Virus 2 NOT DETECTED NOT DETECTED Final   Parainfluenza Virus 3 NOT DETECTED NOT DETECTED Final   Parainfluenza Virus 4 NOT DETECTED NOT DETECTED Final   Respiratory Syncytial Virus DETECTED (A) NOT DETECTED Final   Bordetella pertussis NOT DETECTED NOT DETECTED Final   Bordetella Parapertussis NOT DETECTED NOT DETECTED Final   Chlamydophila pneumoniae NOT DETECTED NOT DETECTED Final   Mycoplasma pneumoniae NOT DETECTED NOT DETECTED Final    Comment: Performed at [facility], [address], [place], [place] [address]  SARS Coronavirus 2 by RT PCR (hospital order, performed in [facility] hospital lab) *cepheid single result test* Nasopharyngeal Swab     Status: None   Collection Time: 01/02/24  6:20 AM   Specimen: Nasopharyngeal Swab; Nasal Swab  Result Value Ref Range Status   SARS Coronavirus 2 by RT PCR NEGATIVE NEGATIVE Final    Comment: (NOTE) SARS-CoV-2 target nucleic acids are NOT DETECTED.  The SARS-CoV-2 RNA is generally detectable in upper and lower respiratory specimens during the acute phase of infection. The lowest concentration of SARS-CoV-2 viral copies this assay can detect is 250 copies / mL. A negative result does not preclude SARS-CoV-2 infection and should not be used as the sole basis for treatment or other patient management decisions.  A negative result may occur with improper specimen collection / handling, submission of specimen other than nasopharyngeal swab, presence of viral mutation(s) within the areas targeted by this assay, and inadequate number of viral copies (<250 copies / mL). A negative result must be combined with clinical observations, patient history, and epidemiological information.  Fact Sheet for  Patients:   RoadLapTop.co.za  Fact Sheet for Healthcare Providers: http://kim-miller.com/  This test is not yet approved or  cleared by the [place] FDA and has been authorized for detection and/or diagnosis of SARS-CoV-2 by FDA under an Emergency Use Authorization (EUA).  This EUA will remain in effect (meaning this test can be used) for the duration of the COVID-19 declaration under Section 564(b)(1) of the Act, 21 U.S.C. section 360bbb-3(b)(1), unless the authorization is terminated or revoked sooner.  Performed at [facility], [address] [place], [place] [address]     Labs: CBC: Recent Labs  Lab 01/01/24 1721 01/02/24 0409  WBC 5.0 4.4  NEUTROABS 3.0 2.0  HGB 12.6 12.0  HCT 37.7 35.9*  MCV 87.5 88.9  PLT 194 193   Basic Metabolic Panel: Recent Labs  Lab 01/01/24 1721 01/02/24 0409  NA 138 137  K 3.7 3.4*  CL 103 106  CO2 27 23  GLUCOSE 104* 89  BUN 18 18  CREATININE 0.87 0.75  CALCIUM 8.7* 8.1*  MG  --  2.2   Liver Function Tests: Recent Labs  Lab 01/02/24 0409  AST 20  ALT 17  ALKPHOS 46  BILITOT 0.7  PROT 6.0*  ALBUMIN 3.4*   CBG: No results for input(s): "GLUCAP" in the last 168 hours.  Discharge time spent: greater than 30 minutes.  Signed: Brendia Sacks, MD [facility] 01/03/2024

## 2024-01-07 DIAGNOSIS — J9601 Acute respiratory failure with hypoxia: Secondary | ICD-10-CM | POA: Diagnosis not present

## 2024-01-07 DIAGNOSIS — J121 Respiratory syncytial virus pneumonia: Secondary | ICD-10-CM | POA: Diagnosis not present

## 2024-01-07 DIAGNOSIS — I1310 Hypertensive heart and chronic kidney disease without heart failure, with stage 1 through stage 4 chronic kidney disease, or unspecified chronic kidney disease: Secondary | ICD-10-CM | POA: Diagnosis not present

## 2024-03-25 DIAGNOSIS — C4339 Malignant melanoma of other parts of face: Secondary | ICD-10-CM | POA: Diagnosis not present

## 2024-05-10 DIAGNOSIS — S199XXA Unspecified injury of neck, initial encounter: Secondary | ICD-10-CM | POA: Diagnosis not present

## 2024-05-10 DIAGNOSIS — S0990XA Unspecified injury of head, initial encounter: Secondary | ICD-10-CM | POA: Diagnosis not present

## 2024-05-10 DIAGNOSIS — N10 Acute tubulo-interstitial nephritis: Secondary | ICD-10-CM | POA: Diagnosis not present

## 2024-05-10 DIAGNOSIS — R051 Acute cough: Secondary | ICD-10-CM | POA: Diagnosis not present

## 2024-05-10 DIAGNOSIS — R8290 Unspecified abnormal findings in urine: Secondary | ICD-10-CM | POA: Diagnosis not present

## 2024-05-10 DIAGNOSIS — R6889 Other general symptoms and signs: Secondary | ICD-10-CM | POA: Diagnosis not present

## 2024-05-10 DIAGNOSIS — I10 Essential (primary) hypertension: Secondary | ICD-10-CM | POA: Diagnosis not present

## 2024-05-10 DIAGNOSIS — Z791 Long term (current) use of non-steroidal anti-inflammatories (NSAID): Secondary | ICD-10-CM | POA: Diagnosis not present

## 2024-05-10 DIAGNOSIS — N390 Urinary tract infection, site not specified: Secondary | ICD-10-CM | POA: Diagnosis not present

## 2024-05-10 DIAGNOSIS — E079 Disorder of thyroid, unspecified: Secondary | ICD-10-CM | POA: Diagnosis not present

## 2024-05-10 DIAGNOSIS — Z79890 Hormone replacement therapy (postmenopausal): Secondary | ICD-10-CM | POA: Diagnosis not present

## 2024-05-10 DIAGNOSIS — E7800 Pure hypercholesterolemia, unspecified: Secondary | ICD-10-CM | POA: Diagnosis not present

## 2024-05-10 DIAGNOSIS — R109 Unspecified abdominal pain: Secondary | ICD-10-CM | POA: Diagnosis not present

## 2024-05-10 DIAGNOSIS — R509 Fever, unspecified: Secondary | ICD-10-CM | POA: Diagnosis not present

## 2024-05-10 DIAGNOSIS — Z79899 Other long term (current) drug therapy: Secondary | ICD-10-CM | POA: Diagnosis not present

## 2024-05-10 DIAGNOSIS — R112 Nausea with vomiting, unspecified: Secondary | ICD-10-CM | POA: Diagnosis not present

## 2024-05-13 ENCOUNTER — Ambulatory Visit
Admission: RE | Admit: 2024-05-13 | Discharge: 2024-05-13 | Disposition: A | Discharge status: Home / Self Care | Source: Ambulatory Visit | Attending: Physician Assistant | Admitting: Physician Assistant

## 2024-05-13 ENCOUNTER — Other Ambulatory Visit: Payer: Self-pay | Admitting: Physician Assistant

## 2024-05-13 DIAGNOSIS — R296 Repeated falls: Secondary | ICD-10-CM | POA: Diagnosis not present

## 2024-05-13 DIAGNOSIS — I739 Peripheral vascular disease, unspecified: Secondary | ICD-10-CM | POA: Diagnosis not present

## 2024-05-13 DIAGNOSIS — S59909A Unspecified injury of unspecified elbow, initial encounter: Secondary | ICD-10-CM

## 2024-05-13 DIAGNOSIS — N200 Calculus of kidney: Secondary | ICD-10-CM | POA: Diagnosis not present

## 2024-05-13 DIAGNOSIS — S59901A Unspecified injury of right elbow, initial encounter: Secondary | ICD-10-CM | POA: Diagnosis not present

## 2024-05-13 DIAGNOSIS — I2510 Atherosclerotic heart disease of native coronary artery without angina pectoris: Secondary | ICD-10-CM | POA: Diagnosis not present

## 2024-05-13 DIAGNOSIS — N390 Urinary tract infection, site not specified: Secondary | ICD-10-CM | POA: Diagnosis not present

## 2024-05-13 DIAGNOSIS — I701 Atherosclerosis of renal artery: Secondary | ICD-10-CM | POA: Diagnosis not present

## 2024-05-13 DIAGNOSIS — W19XXXA Unspecified fall, initial encounter: Secondary | ICD-10-CM | POA: Diagnosis not present

## 2024-05-13 DIAGNOSIS — S0990XA Unspecified injury of head, initial encounter: Secondary | ICD-10-CM | POA: Diagnosis not present

## 2024-05-18 DIAGNOSIS — I701 Atherosclerosis of renal artery: Secondary | ICD-10-CM | POA: Diagnosis not present

## 2024-05-18 DIAGNOSIS — E782 Mixed hyperlipidemia: Secondary | ICD-10-CM | POA: Diagnosis not present

## 2024-05-18 DIAGNOSIS — I25119 Atherosclerotic heart disease of native coronary artery with unspecified angina pectoris: Secondary | ICD-10-CM | POA: Diagnosis not present

## 2024-05-18 DIAGNOSIS — M549 Dorsalgia, unspecified: Secondary | ICD-10-CM | POA: Diagnosis not present

## 2024-05-18 DIAGNOSIS — N183 Chronic kidney disease, stage 3 unspecified: Secondary | ICD-10-CM | POA: Diagnosis not present

## 2024-05-18 DIAGNOSIS — M810 Age-related osteoporosis without current pathological fracture: Secondary | ICD-10-CM | POA: Diagnosis not present

## 2024-05-18 DIAGNOSIS — E039 Hypothyroidism, unspecified: Secondary | ICD-10-CM | POA: Diagnosis not present

## 2024-05-18 DIAGNOSIS — I1310 Hypertensive heart and chronic kidney disease without heart failure, with stage 1 through stage 4 chronic kidney disease, or unspecified chronic kidney disease: Secondary | ICD-10-CM | POA: Diagnosis not present

## 2024-05-18 DIAGNOSIS — E1122 Type 2 diabetes mellitus with diabetic chronic kidney disease: Secondary | ICD-10-CM | POA: Diagnosis not present

## 2024-05-18 LAB — LAB REPORT - SCANNED
A1c: 7
EGFR: 64

## 2024-05-21 DIAGNOSIS — M549 Dorsalgia, unspecified: Secondary | ICD-10-CM | POA: Diagnosis not present

## 2024-05-21 DIAGNOSIS — R270 Ataxia, unspecified: Secondary | ICD-10-CM | POA: Diagnosis not present

## 2024-05-26 DIAGNOSIS — R270 Ataxia, unspecified: Secondary | ICD-10-CM | POA: Diagnosis not present

## 2024-05-26 DIAGNOSIS — M549 Dorsalgia, unspecified: Secondary | ICD-10-CM | POA: Diagnosis not present

## 2024-05-28 DIAGNOSIS — M549 Dorsalgia, unspecified: Secondary | ICD-10-CM | POA: Diagnosis not present

## 2024-05-28 DIAGNOSIS — R270 Ataxia, unspecified: Secondary | ICD-10-CM | POA: Diagnosis not present

## 2024-05-28 NOTE — Progress Notes (Signed)
 Cardiology Office Note:    Date:  05/29/2024   ID:  Erica [name], DOB May 08, 1941, MRN [id number]  PCP:  Loreli Kins, MD   [facility] Providers Cardiologist:  Ozell Fell, MD { Referring MD: Loreli Kins, MD   Chief Complaint  Patient presents with   Follow-up    Balance issues    History of Present Illness:    Erica [name] is a 83 y.o. female with a hx of CAD, carotid artery stenosis, hypertension, PAD, hyperlipidemia.  He has had multiple prior PCI's and is s/p CABG in 2011.  He underwent POBA to RCA due to ISR and staged atherectomy/stenting to LAD in 2019.  He is status post bilateral iliac artery stenting in 2019, follows with Dr. Darron.  She has not tolerated beta-blocker in the past due to bradycardia.  Due to nocturnal angina, Imdur  was increased.  When she was seen back for follow-up 11/2023, chest pain improved but still intermittent nitroglycerin .  She presents today stating she has had increased SOB at rest and has had balance issues. She has fallen several times. She was taken to the ER in [place] at [facility] for dizziness and fall. She was treated for UTI and started dry needling which has helped her balance. CTA negative for PE.   Recent labs with PCP showed mildly elevated TSH prompting increase in synthroid  which she is concerned about.   With further questioning, she had decreased activity when she was dizzy for 2 weeks.  She now gets short of breath when walking the dog in the heat outside but not when walking in the air conditioner, for example in big box stores.  She denies orthopnea, lower extremity swelling, and weight gain.   Past Medical History:  Diagnosis Date   Anxiety    Chronic mid back pain    Coronary artery disease    status post multiple percutaneous interventions // Echocardiogram 10/22: EF 60-65, no RWMA, normal RVSF, RVSP 27.6, trivial MR   Depression    History of kidney stones 1990s X 1; 2016    Hyperlipidemia     Hypertension    Hypothyroidism    Myocardial infarction (HCC) 01/2010   Osteoarthritis    hands, back (10/15/2018)    Past Surgical History:  Procedure Laterality Date   ABDOMINAL AORTOGRAM W/LOWER EXTREMITY  10/15/2018   ABDOMINAL AORTOGRAM W/LOWER EXTREMITY N/A 10/15/2018   Procedure: ABDOMINAL AORTOGRAM W/LOWER EXTREMITY;  Surgeon: Darron Deatrice LABOR, MD;  Location: [facility] INVASIVE CV LAB;  Service: Cardiovascular;  Laterality: N/A;   CARDIAC CATHETERIZATION  12-2008   CATARACT EXTRACTION W/ INTRAOCULAR LENS IMPLANT Bilateral    CORONARY ANGIOPLASTY WITH STENT PLACEMENT  2006    right coronary artery with drug-eluting stent   CORONARY ANGIOPLASTY WITH STENT PLACEMENT  1998    bare-metal stent to the right coronary artery   CORONARY ANGIOPLASTY WITH STENT PLACEMENT  2008   drug-eluting stent placed in the left circumflex   CORONARY ARTERY BYPASS GRAFT  01-25-10   CABG X4   CORONARY ATHERECTOMY N/A 07/16/2018   Procedure: CORONARY ATHERECTOMY;  Surgeon: Fell Ozell, MD;  Location: [facility] INVASIVE CV LAB;  Service: Cardiovascular;  Laterality: N/A;   CORONARY BALLOON ANGIOPLASTY N/A 06/04/2018   Procedure: CORONARY BALLOON ANGIOPLASTY;  Surgeon: Fell Ozell, MD;  Location: [facility] INVASIVE CV LAB;  Service: Cardiovascular;  Laterality: N/A;   CORONARY PRESSURE/FFR STUDY N/A 06/04/2018   Procedure: INTRAVASCULAR PRESSURE WIRE/FFR STUDY;  Surgeon: Fell Ozell, MD;  Location: [facility] INVASIVE  CV LAB;  Service: Cardiovascular;  Laterality: N/A;   CYSTOSCOPY WITH RETROGRADE PYELOGRAM, URETEROSCOPY AND STENT PLACEMENT Right 10/18/2015   Procedure: CYSTOSCOPY WITH RETROGRADE PYELOGRAM,  AND STENT PLACEMENT;  Surgeon: Donnice Brooks, MD;  Location: [facility];  Service: Urology;  Laterality: Right;   CYSTOSCOPY/URETEROSCOPY/HOLMIUM LASER/STENT PLACEMENT Right 11/29/2015   Procedure: RIGHT URETEROSCOPY/RIGHT RETROGRADE PYELOGRAM/ RIGHT STENT REPLACEMENT;  Surgeon: Donnice Brooks, MD;  Location: [facility];  Service: Urology;  Laterality: Right;   FOOT SURGERY Bilateral 2008-2016   bones shortened prox to  great toes   HAMMER TOE SURGERY Right 2008   2nd and 3rd digits   LEFT HEART CATH AND CORS/GRAFTS ANGIOGRAPHY N/A 06/04/2018   Procedure: LEFT HEART CATH AND CORS/GRAFTS ANGIOGRAPHY;  Surgeon: Wonda Sharper, MD;  Location: [facility] INVASIVE CV LAB;  Service: Cardiovascular;  Laterality: N/A;   PERIPHERAL VASCULAR INTERVENTION Bilateral 10/15/2018   PERIPHERAL VASCULAR INTERVENTION Bilateral 10/15/2018   Procedure: PERIPHERAL VASCULAR INTERVENTION;  Surgeon: Darron Deatrice LABOR, MD;  Location: [facility] INVASIVE CV LAB;  Service: Cardiovascular;  Laterality: Bilateral;   ULTRASOUND GUIDANCE FOR VASCULAR ACCESS  06/04/2018   Procedure: Ultrasound Guidance For Vascular Access;  Surgeon: Wonda Sharper, MD;  Location: [facility] INVASIVE CV LAB;  Service: Cardiovascular;;    Current Medications: Current Meds  Medication Sig   acetaminophen  (TYLENOL ) 500 MG tablet Take 1,000 mg by mouth every 6 (six) hours as needed for mild pain (pain score 1-3) or moderate pain (pain score 4-6).   amLODipine  (NORVASC ) 5 MG tablet Take 5 mg by mouth daily.   calcium  citrate (CALCITRATE - DOSED IN MG ELEMENTAL CALCIUM ) 950 (200 Ca) MG tablet Take by mouth.   clopidogrel  (PLAVIX ) 75 MG tablet Take 1 tablet (75 mg total) by mouth daily.   denosumab  (PROLIA ) 60 MG/ML SOSY injection Inject 1 mL into the skin every 6 (six) months.   ezetimibe  (ZETIA ) 10 MG tablet TAKE ONE TABLET BY MOUTH DAILY   isosorbide  mononitrate (IMDUR ) 30 MG 24 hr tablet Take 1 tablet (30 mg total) by mouth daily.   levothyroxine  (SYNTHROID , LEVOTHROID) 100 MCG tablet Take 100 mcg by mouth daily before breakfast.    minoxidil  (LONITEN ) 2.5 MG tablet Take 0.5 tablets by mouth daily.   Multiple Vitamins-Minerals (WOMENS MULTIVITAMIN PO) Take 1 tablet by mouth daily.    rosuvastatin  (CRESTOR ) 40 MG tablet Take 40 mg by mouth daily.   sertraline   (ZOLOFT ) 50 MG tablet Take 50 mg by mouth daily.    [DISCONTINUED] nitroGLYCERIN  (NITROSTAT ) 0.4 MG SL tablet Place 1 tablet (0.4 mg total) under the tongue every 5 (five) minutes as needed for chest pain.     Allergies:   Isosulfan blue, Boniva [ibandronate], and Morphine and codeine   Social History   Socioeconomic History   Marital status: Widowed    Spouse name: Not on file   Number of children: Not on file   Years of education: Not on file   Highest education level: Not on file  Occupational History   Occupation: office manager  Tobacco Use   Smoking status: Former    Current packs/day: 0.00    Average packs/day: 1 pack/day for 37.0 years (37.0 ttl pk-yrs)    Types: Cigarettes    Start date: 11/23/1959    Quit date: 11/22/1996    Years since quitting: 27.5   Smokeless tobacco: Never  Vaping Use   Vaping status: Never Used  Substance and Sexual Activity   Alcohol use: Not Currently    Comment: 10/15/2018  COUPLE DRINKS/YEAR   Drug use: Never   Sexual activity: Not Currently  Other Topics Concern   Not on file  Social History Narrative   Not on file   Social Drivers of Health   Financial Resource Strain: Low Risk  (04/25/2021)   Received from [facility]   Overall Financial Resource Strain (CARDIA)    Difficulty of Paying Living Expenses: Not very hard  Food Insecurity: No Food Insecurity (01/02/2024)   Hunger Vital Sign    Worried About Running Out of Food in the Last Year: Never true    Ran Out of Food in the Last Year: Never true  Transportation Needs: No Transportation Needs (01/02/2024)   PRAPARE - Administrator, Civil Service (Medical): No    Lack of Transportation (Non-Medical): No  Physical Activity: Not on file  Stress: Not on file  Social Connections: Unknown (01/02/2024)   Social Connection and Isolation Panel    Frequency of Communication with Friends and Family: Never    Frequency of Social Gatherings with Friends and Family: Never     Attends Religious Services: Never    Diplomatic Services operational officer: Patient declined    Attends Engineer, structural: Patient declined    Marital Status: Patient declined     Family History: The patient's family history includes Other in an other family member; Other (age of onset: 7) in her mother.  ROS:   Please see the history of present illness.     All other systems reviewed and are negative.  EKGs/Labs/Other Studies Reviewed:    The following studies were reviewed today:       Recent Labs: 01/02/2024: ALT 17; B Natriuretic Peptide 90.5; BUN 18; Creatinine, Ser 0.75; Hemoglobin 12.0; Magnesium 2.2; Platelets 193; Potassium 3.4; Sodium 137; TSH 1.798  Recent Lipid Panel    Component Value Date/Time   CHOL 148 02/15/2020 1109   TRIG 103 02/15/2020 1109   HDL 69 02/15/2020 1109   CHOLHDL 2.1 02/15/2020 1109   CHOLHDL 2.7 01/09/2010 0506   VLDL 17 01/09/2010 0506   LDLCALC 60 02/15/2020 1109     Risk Assessment/Calculations:                Physical Exam:    VS:  BP 112/62 (BP Location: Left Arm, Patient Position: Sitting, Cuff Size: Normal)   Pulse 61   Ht 5' 1 (1.549 m)   Wt 119 lb 6.6 oz (54.2 kg)   SpO2 97%   BMI 22.56 kg/m     Wt Readings from Last 3 Encounters:  05/29/24 119 lb 6.6 oz (54.2 kg)  01/01/24 124 lb 4 oz (56.4 kg)  11/15/23 124 lb 3.2 oz (56.3 kg)     GEN:  Well nourished, well developed in no acute distress HEENT: Normal NECK: No JVD; No carotid bruits LYMPHATICS: No lymphadenopathy CARDIAC: RRR, no murmurs, rubs, gallops RESPIRATORY:  Clear to auscultation without rales, wheezing or rhonchi  ABDOMEN: Soft, non-tender, non-distended MUSCULOSKELETAL:  No edema; No deformity  SKIN: Warm and dry NEUROLOGIC:  Alert and oriented x 3 PSYCHIATRIC:  Normal affect   ASSESSMENT:    1. SHORTNESS OF BREATH   2. Coronary artery disease involving native coronary artery of native heart with angina pectoris (HCC)   3.  Hx of CABG   4. Primary hypertension   5. Hyperlipidemia with target LDL less than 70   6. Hypothyroidism, unspecified type    PLAN:    In order  of problems listed above:  SOB - felt likely due to deconditioning with recent balance issues that were likely related to pulled muscle in her back and UTI -Thankfully balance issues have improved with dry needling - she gets SOB when walking the dog outside in the heat, but not when walking big box stores in the air conditioning - no orthopnea, LE swelling, chest pain, or palpitations - will monitor for now, if ongoing when she comes back we will do an echo   CAD CABG 2011 Last intervention 2019 - Medical therapy for nocturnal angina - Continue Plavix  monotherapy - Continue 30 mg Imdur , 5 mg amlodipine    Hypothyroidism - agree with increasing synthroid  per PCP, she was hesitant given cardiac history - we reviewed how abnormal TSH could possibly contribute to balance problems  Hypertension - Continue 5 mg Norvasc , 30 mg Imdur    Hyperlipidemia with LDL goal less than 70 - Continue 40 mg Crestor  and 10 mg Zetia  - Labs followed by PCP   Follow-up in 1 month.         Medication Adjustments/Labs and Tests Ordered: Current medicines are reviewed at length with the patient today.  Concerns regarding medicines are outlined above.  No orders of the defined types were placed in this encounter.  Meds ordered this encounter  Medications   nitroGLYCERIN  (NITROSTAT ) 0.4 MG SL tablet    Sig: Place 1 tablet (0.4 mg total) under the tongue every 5 (five) minutes as needed for chest pain.    Dispense:  75 tablet    Refill:  2    Patient Instructions  Medication Instructions:  No medication changes were made during today's visit.  *If you need a refill on your cardiac medications before your next appointment, please call your pharmacy*   Lab Work: No labs were ordered during today's visit.  If you have labs (blood work) drawn  today and your tests are completely normal, you will receive your results only by: MyChart Message (if you have MyChart) OR A paper copy in the mail If you have any lab test that is abnormal or we need to change your treatment, we will call you to review the results.   Testing/Procedures: No procedures were ordered during today's visit.    Follow-Up: At [facility], you and your health needs are our priority.  As part of our continuing mission to provide you with exceptional heart care, we have created designated Provider Care Teams.  These Care Teams include your primary Cardiologist (physician) and Advanced Practice Providers (APPs -  Physician Assistants and Nurse Practitioners) who all work together to provide you with the care you need, when you need it.  We recommend signing up for the patient portal called MyChart.  Sign up information is provided on this After Visit Summary.  MyChart is used to connect with patients for Virtual Visits (Telemedicine).  Patients are able to view lab/test results, encounter notes, upcoming appointments, etc.  Non-urgent messages can be sent to your provider as well.   To learn more about what you can do with MyChart, go to [email or web address].     Provider:  Jon Hails  Other Instructions Thank you for choosing [facility]!       Signed, Jon Nat Hails, [place]  05/29/2024 4:59 PM    [facility]

## 2024-05-29 ENCOUNTER — Encounter: Payer: Self-pay | Admitting: Physician Assistant

## 2024-05-29 ENCOUNTER — Ambulatory Visit: Attending: Physician Assistant | Admitting: Physician Assistant

## 2024-05-29 VITALS — BP 112/62 | HR 61 | Ht 61.0 in | Wt 119.4 lb

## 2024-05-29 DIAGNOSIS — I25119 Atherosclerotic heart disease of native coronary artery with unspecified angina pectoris: Secondary | ICD-10-CM

## 2024-05-29 DIAGNOSIS — I10 Essential (primary) hypertension: Secondary | ICD-10-CM | POA: Diagnosis not present

## 2024-05-29 DIAGNOSIS — R0602 Shortness of breath: Secondary | ICD-10-CM | POA: Diagnosis not present

## 2024-05-29 DIAGNOSIS — E039 Hypothyroidism, unspecified: Secondary | ICD-10-CM

## 2024-05-29 DIAGNOSIS — E785 Hyperlipidemia, unspecified: Secondary | ICD-10-CM | POA: Diagnosis not present

## 2024-05-29 DIAGNOSIS — Z951 Presence of aortocoronary bypass graft: Secondary | ICD-10-CM | POA: Diagnosis not present

## 2024-05-29 MED ORDER — NITROGLYCERIN 0.4 MG SL SUBL: 0.4000 mg | SUBLINGUAL_TABLET | SUBLINGUAL | 2 refills | Status: AC | PRN

## 2024-05-29 NOTE — Patient Instructions (Signed)
 Medication Instructions:  No medication changes were made during today's visit.  *If you need a refill on your cardiac medications before your next appointment, please call your pharmacy*   Lab Work: No labs were ordered during today's visit.  If you have labs (blood work) drawn today and your tests are completely normal, you will receive your results only by: MyChart Message (if you have MyChart) OR A paper copy in the mail If you have any lab test that is abnormal or we need to change your treatment, we will call you to review the results.   Testing/Procedures: No procedures were ordered during today's visit.    Follow-Up: At [facility], you and your health needs are our priority.  As part of our continuing mission to provide you with exceptional heart care, we have created designated Provider Care Teams.  These Care Teams include your primary Cardiologist (physician) and Advanced Practice Providers (APPs -  Physician Assistants and Nurse Practitioners) who all work together to provide you with the care you need, when you need it.  We recommend signing up for the patient portal called MyChart.  Sign up information is provided on this After Visit Summary.  MyChart is used to connect with patients for Virtual Visits (Telemedicine).  Patients are able to view lab/test results, encounter notes, upcoming appointments, etc.  Non-urgent messages can be sent to your provider as well.   To learn more about what you can do with MyChart, go to [email or web address].     Provider:  Jon Hails  Other Instructions Thank you for choosing [facility]!

## 2024-06-01 DIAGNOSIS — M549 Dorsalgia, unspecified: Secondary | ICD-10-CM | POA: Diagnosis not present

## 2024-06-01 DIAGNOSIS — R270 Ataxia, unspecified: Secondary | ICD-10-CM | POA: Diagnosis not present

## 2024-06-03 ENCOUNTER — Ambulatory Visit (HOSPITAL_BASED_OUTPATIENT_CLINIC_OR_DEPARTMENT_OTHER)
Admission: RE | Admit: 2024-06-03 | Discharge: 2024-06-03 | Disposition: A | Discharge status: Home / Self Care | Source: Ambulatory Visit | Attending: Cardiovascular Disease | Admitting: Cardiovascular Disease

## 2024-06-03 ENCOUNTER — Ambulatory Visit (HOSPITAL_COMMUNITY)
Admission: RE | Admit: 2024-06-03 | Discharge: 2024-06-03 | Disposition: A | Discharge status: Home / Self Care | Source: Ambulatory Visit | Attending: Cardiovascular Disease | Admitting: Cardiovascular Disease

## 2024-06-03 ENCOUNTER — Ambulatory Visit: Payer: Self-pay | Admitting: Cardiovascular Disease

## 2024-06-03 DIAGNOSIS — I739 Peripheral vascular disease, unspecified: Secondary | ICD-10-CM | POA: Diagnosis not present

## 2024-06-03 DIAGNOSIS — Z95828 Presence of other vascular implants and grafts: Secondary | ICD-10-CM | POA: Diagnosis not present

## 2024-06-03 LAB — VAS US ABI WITH/WO TBI
Left ABI: 1.01
Right ABI: 1.05

## 2024-06-09 ENCOUNTER — Encounter (HOSPITAL_COMMUNITY)

## 2024-06-21 DIAGNOSIS — Z23 Encounter for immunization: Secondary | ICD-10-CM | POA: Diagnosis not present

## 2024-06-21 DIAGNOSIS — S61432A Puncture wound without foreign body of left hand, initial encounter: Secondary | ICD-10-CM | POA: Diagnosis not present

## 2024-06-25 DIAGNOSIS — C4339 Malignant melanoma of other parts of face: Secondary | ICD-10-CM | POA: Diagnosis not present

## 2024-06-25 DIAGNOSIS — I10 Essential (primary) hypertension: Secondary | ICD-10-CM | POA: Diagnosis not present

## 2024-06-25 DIAGNOSIS — C434 Malignant melanoma of scalp and neck: Secondary | ICD-10-CM | POA: Diagnosis not present

## 2024-06-25 DIAGNOSIS — Z79899 Other long term (current) drug therapy: Secondary | ICD-10-CM | POA: Diagnosis not present

## 2024-06-25 DIAGNOSIS — E7800 Pure hypercholesterolemia, unspecified: Secondary | ICD-10-CM | POA: Diagnosis not present

## 2024-06-25 DIAGNOSIS — Z7902 Long term (current) use of antithrombotics/antiplatelets: Secondary | ICD-10-CM | POA: Diagnosis not present

## 2024-06-30 NOTE — Progress Notes (Unsigned)
 Cardiology Office Note:    Date:  07/01/2024   ID:  Erica [name], DOB [birth year], MRN [id number]  PCP:  Erica [name]   [facility] Providers Cardiologist:  Erica [name] Cardiology APP:  Erica [name], [place]     Referring [name]: Erica [name]   Chief Complaint  Patient presents with   PAD (peripheral artery disease)    Shortness of Breath    History of Present Illness:    Erica [name] is a 83 y.o. female with a hx of CAD, carotid artery stenosis, hypertension, PAD, hyperlipidemia.  He has had multiple prior PCI's and is s/p CABG in 2011.  He underwent POBA to RCA due to ISR and staged atherectomy/stenting to LAD in 2019.  He is status post bilateral iliac artery stenting in 2019, follows with Dr. Darron.  He has not tolerated beta-blocker in the past due to bradycardia.  Due to nocturnal angina, Imdur  was increased.  When she was seen back for follow-up 11/2023, chest pain improved but still intermittent nitroglycerin .  I saw her June 2025 and she reported increased shortness of breath at rest and balance issues.  Dry needling had helped her balance issues.  She had reduced her activity due to dizziness and I suspected shortness of breath likely related to hot temperature and deconditioning.  She denied dyspnea on exertion with walking in the air conditioning.  Echo was deferred with close follow-up.  She presents today for follow-up. She is overall doing much better from a cardiac perspective. No SOB, LE swelling, dizziness, or syncope. Just had follow up PET scan at [facility].    Past Medical History:  Diagnosis Date   Anxiety    Chronic mid back pain    Coronary artery disease    status post multiple percutaneous interventions // Echocardiogram 10/22: EF 60-65, no RWMA, normal RVSF, RVSP 27.6, trivial MR   Depression    History of kidney stones 1990s X 1; 2016    Hyperlipidemia    Hypertension    Hypothyroidism    Myocardial infarction (HCC)  01/2010   Osteoarthritis    hands, back (10/15/2018)    Past Surgical History:  Procedure Laterality Date   ABDOMINAL AORTOGRAM W/LOWER EXTREMITY  10/15/2018   ABDOMINAL AORTOGRAM W/LOWER EXTREMITY N/A 10/15/2018   Procedure: ABDOMINAL AORTOGRAM W/LOWER EXTREMITY;  Surgeon: Erica Deatrice LABOR, [name];  Location: [facility] INVASIVE CV LAB;  Service: Cardiovascular;  Laterality: N/A;   CARDIAC CATHETERIZATION  12-2008   CATARACT EXTRACTION W/ INTRAOCULAR LENS IMPLANT Bilateral    CORONARY ANGIOPLASTY WITH STENT PLACEMENT  2006    right coronary artery with drug-eluting stent   CORONARY ANGIOPLASTY WITH STENT PLACEMENT  1998    bare-metal stent to the right coronary artery   CORONARY ANGIOPLASTY WITH STENT PLACEMENT  2008   drug-eluting stent placed in the left circumflex   CORONARY ARTERY BYPASS GRAFT  01-25-10   CABG X4   CORONARY ATHERECTOMY N/A 07/16/2018   Procedure: CORONARY ATHERECTOMY;  Surgeon: Fell Ozell, [name];  Location: [facility] INVASIVE CV LAB;  Service: Cardiovascular;  Laterality: N/A;   CORONARY BALLOON ANGIOPLASTY N/A 06/04/2018   Procedure: CORONARY BALLOON ANGIOPLASTY;  Surgeon: Fell Ozell, [name];  Location: [facility] INVASIVE CV LAB;  Service: Cardiovascular;  Laterality: N/A;   CORONARY PRESSURE/FFR STUDY N/A 06/04/2018   Procedure: INTRAVASCULAR PRESSURE WIRE/FFR STUDY;  Surgeon: Fell Ozell, [name];  Location: [facility] INVASIVE CV LAB;  Service: Cardiovascular;  Laterality: N/A;   CYSTOSCOPY WITH RETROGRADE PYELOGRAM, URETEROSCOPY AND  STENT PLACEMENT Right 10/18/2015   Procedure: CYSTOSCOPY WITH RETROGRADE PYELOGRAM,  AND STENT PLACEMENT;  Surgeon: Donnice Brooks, [name];  Location: [facility];  Service: Urology;  Laterality: Right;   CYSTOSCOPY/URETEROSCOPY/HOLMIUM LASER/STENT PLACEMENT Right 11/29/2015   Procedure: RIGHT URETEROSCOPY/RIGHT RETROGRADE PYELOGRAM/ RIGHT STENT REPLACEMENT;  Surgeon: Donnice Brooks, [name];  Location: [facility];  Service: Urology;  Laterality: Right;   FOOT  SURGERY Bilateral 2008-2016   bones shortened prox to  great toes   HAMMER TOE SURGERY Right 2008   2nd and 3rd digits   LEFT HEART CATH AND CORS/GRAFTS ANGIOGRAPHY N/A 06/04/2018   Procedure: LEFT HEART CATH AND CORS/GRAFTS ANGIOGRAPHY;  Surgeon: Erica [name];  Location: [facility] INVASIVE CV LAB;  Service: Cardiovascular;  Laterality: N/A;   PERIPHERAL VASCULAR INTERVENTION Bilateral 10/15/2018   PERIPHERAL VASCULAR INTERVENTION Bilateral 10/15/2018   Procedure: PERIPHERAL VASCULAR INTERVENTION;  Surgeon: Erica Deatrice LABOR, [name];  Location: [facility] INVASIVE CV LAB;  Service: Cardiovascular;  Laterality: Bilateral;   ULTRASOUND GUIDANCE FOR VASCULAR ACCESS  06/04/2018   Procedure: Ultrasound Guidance For Vascular Access;  Surgeon: Erica [name];  Location: [facility] INVASIVE CV LAB;  Service: Cardiovascular;;    Current Medications: Current Meds  Medication Sig   acetaminophen  (TYLENOL ) 500 MG tablet Take 1,000 mg by mouth every 6 (six) hours as needed for mild pain (pain score 1-3) or moderate pain (pain score 4-6).   amLODipine  (NORVASC ) 5 MG tablet Take 5 mg by mouth daily.   calcium  citrate (CALCITRATE - DOSED IN MG ELEMENTAL CALCIUM ) 950 (200 Ca) MG tablet Take by mouth.   clopidogrel  (PLAVIX ) 75 MG tablet Take 1 tablet (75 mg total) by mouth daily.   denosumab  (PROLIA ) 60 MG/ML SOSY injection Inject 1 mL into the skin every 6 (six) months.   ezetimibe  (ZETIA ) 10 MG tablet TAKE ONE TABLET BY MOUTH DAILY   isosorbide  mononitrate (IMDUR ) 30 MG 24 hr tablet Take 1 tablet (30 mg total) by mouth daily.   levothyroxine  (SYNTHROID ) 125 MCG tablet Take 125 mcg by mouth daily.   minoxidil  (LONITEN ) 2.5 MG tablet Take 0.5 tablets by mouth daily.   Multiple Vitamins-Minerals (WOMENS MULTIVITAMIN PO) Take 1 tablet by mouth daily.    nitroGLYCERIN  (NITROSTAT ) 0.4 MG SL tablet Place 1 tablet (0.4 mg total) under the tongue every 5 (five) minutes as needed for chest pain.   rosuvastatin  (CRESTOR ) 40 MG  tablet Take 40 mg by mouth daily.   sertraline  (ZOLOFT ) 50 MG tablet Take 50 mg by mouth daily.      Allergies:   Isosulfan blue, Boniva [ibandronate], and Morphine and codeine   Social History   Socioeconomic History   Marital status: Widowed    Spouse name: Not on file   Number of children: Not on file   Years of education: Not on file   Highest education level: Not on file  Occupational History   Occupation: Print production planner  Tobacco Use   Smoking status: Former    Current packs/day: 0.00    Average packs/day: 1 pack/day for 37.0 years (37.0 ttl pk-yrs)    Types: Cigarettes    Start date: 11/23/1959    Quit date: 11/22/1996    Years since quitting: 27.6   Smokeless tobacco: Never  Vaping Use   Vaping status: Never Used  Substance and Sexual Activity   Alcohol use: Not Currently    Comment: 10/15/2018 COUPLE DRINKS/YEAR   Drug use: Never   Sexual activity: Not Currently  Other Topics Concern   Not on  file  Social History Narrative   Not on file   Social Drivers of Health   Financial Resource Strain: Low Risk  (04/25/2021)   Received from [facility]   Overall Financial Resource Strain (CARDIA)    Difficulty of Paying Living Expenses: Not very hard  Food Insecurity: No Food Insecurity (06/25/2024)   Received from [facility]   Hunger Vital Sign    Within the past 12 months, you worried that your food would run out before you got the money to buy more.: Never true    Within the past 12 months, the food you bought just didn't last and you didn't have money to get more.: Never true  Transportation Needs: No Transportation Needs (06/25/2024)   Received from [facility] - Transportation    Lack of Transportation (Medical): No    Lack of Transportation (Non-Medical): No  Physical Activity: Not on file  Stress: Not on file  Social Connections: Unknown (01/02/2024)   Social Connection and Isolation Panel    Frequency of Communication with Friends and  Family: Never    Frequency of Social Gatherings with Friends and Family: Never    Attends Religious Services: Never    Diplomatic Services operational officer: Patient declined    Attends Engineer, structural: Patient declined    Marital Status: Patient declined     Family History: The patient's family history includes Other in an other family member; Other (age of onset: 7) in her mother.  ROS:   Please see the history of present illness.     All other systems reviewed and are negative.  EKGs/Labs/Other Studies Reviewed:    The following studies were reviewed today:       Recent Labs: 01/02/2024: ALT 17; B Natriuretic Peptide 90.5; BUN 18; Creatinine, Ser 0.75; Hemoglobin 12.0; Magnesium 2.2; Platelets 193; Potassium 3.4; Sodium 137; TSH 1.798  Recent Lipid Panel    Component Value Date/Time   CHOL 148 02/15/2020 1109   TRIG 103 02/15/2020 1109   HDL 69 02/15/2020 1109   CHOLHDL 2.1 02/15/2020 1109   CHOLHDL 2.7 01/09/2010 0506   VLDL 17 01/09/2010 0506   LDLCALC 60 02/15/2020 1109     Risk Assessment/Calculations:                Physical Exam:    VS:  BP 122/60 (BP Location: Left Arm, Patient Position: Sitting, Cuff Size: Normal)   Pulse (!) 56   Resp 16   Ht 5' 1 (1.549 m)   Wt 119 lb 12.8 oz (54.3 kg)   SpO2 94%   BMI 22.64 kg/m     Wt Readings from Last 3 Encounters:  07/01/24 119 lb 12.8 oz (54.3 kg)  05/29/24 119 lb 6.6 oz (54.2 kg)  01/01/24 124 lb 4 oz (56.4 kg)     GEN:  Well nourished, well developed in no acute distress HEENT: Normal NECK: No JVD; No carotid bruits LYMPHATICS: No lymphadenopathy CARDIAC: RRR, no murmurs, rubs, gallops RESPIRATORY:  Clear to auscultation without rales, wheezing or rhonchi  ABDOMEN: Soft, non-tender, non-distended MUSCULOSKELETAL:  No edema; No deformity  SKIN: Warm and dry NEUROLOGIC:  Alert and oriented x 3 PSYCHIATRIC:  Normal affect   ASSESSMENT:    1. Atherosclerosis of native coronary  artery of native heart without angina pectoris   2. Hx of CABG   3. Primary hypertension   4. Hyperlipidemia with target LDL less than 70  PLAN:    In order of problems listed above:  CAD CABG 2011 Last intervention 2019 - Medical therapy for nocturnal angina - Continue Plavix  monotherapy - Continue 30 mg Imdur , 5 mg amlodipine  -- no chest pain   Hypertension - Continue 5 mg Norvasc    Hyperlipidemia with LDL goal less than 70 - Continue 40 mg Crestor  and 10 mg Zetia  - Labs followed by PCP - well controlled 05/2024 -- LDL 53 -- A1c 7.0%   Hypothyroidism - Recently increased her Synthroid    Follow up in 6 months.      Medication Adjustments/Labs and Tests Ordered: Current medicines are reviewed at length with the patient today.  Concerns regarding medicines are outlined above.  No orders of the defined types were placed in this encounter.  No orders of the defined types were placed in this encounter.   There are no Patient Instructions on file for this visit.   Signed, Jon Nat Hails, PA  07/01/2024 4:00 PM    [facility]

## 2024-07-01 ENCOUNTER — Ambulatory Visit: Attending: Cardiology | Admitting: Physician Assistant

## 2024-07-01 ENCOUNTER — Encounter: Payer: Self-pay | Admitting: Physician Assistant

## 2024-07-01 VITALS — BP 122/60 | HR 56 | Resp 16 | Ht 61.0 in | Wt 119.8 lb

## 2024-07-01 DIAGNOSIS — I10 Essential (primary) hypertension: Secondary | ICD-10-CM

## 2024-07-01 DIAGNOSIS — I2510 Atherosclerotic heart disease of native coronary artery without angina pectoris: Secondary | ICD-10-CM

## 2024-07-01 DIAGNOSIS — R945 Abnormal results of liver function studies: Secondary | ICD-10-CM | POA: Diagnosis not present

## 2024-07-01 DIAGNOSIS — E785 Hyperlipidemia, unspecified: Secondary | ICD-10-CM

## 2024-07-01 DIAGNOSIS — Z951 Presence of aortocoronary bypass graft: Secondary | ICD-10-CM | POA: Diagnosis not present

## 2024-07-01 DIAGNOSIS — E039 Hypothyroidism, unspecified: Secondary | ICD-10-CM | POA: Diagnosis not present

## 2024-07-01 NOTE — Patient Instructions (Signed)
 Medication Instructions:  Your physician recommends that you continue on your current medications as directed. Please refer to the Current Medication list given to you today.  *If you need a refill on your cardiac medications before your next appointment, please call your pharmacy*  Lab Work: NONE If you have labs (blood work) drawn today and your tests are completely normal, you will receive your results only by: MyChart Message (if you have MyChart) OR A paper copy in the mail If you have any lab test that is abnormal or we need to change your treatment, we will call you to review the results.  Testing/Procedures: NONE  Follow-Up: At [facility], you and your health needs are our priority.  As part of our continuing mission to provide you with exceptional heart care, our providers are all part of one team.  This team includes your primary Cardiologist (physician) and Advanced Practice Providers or APPs (Physician Assistants and Nurse Practitioners) who all work together to provide you with the care you need, when you need it.  Your next appointment:   JANUARY 2026  Provider:   Ozell Fell, MD   We recommend signing up for the patient portal called MyChart.  Sign up information is provided on this After Visit Summary.  MyChart is used to connect with patients for Virtual Visits (Telemedicine).  Patients are able to view lab/test results, encounter notes, upcoming appointments, etc.  Non-urgent messages can be sent to your provider as well.   To learn more about what you can do with MyChart, go to [email or web address].

## 2024-07-10 ENCOUNTER — Other Ambulatory Visit: Payer: Self-pay | Admitting: Cardiovascular Disease

## 2024-08-13 DIAGNOSIS — E039 Hypothyroidism, unspecified: Secondary | ICD-10-CM | POA: Diagnosis not present

## 2024-09-11 DIAGNOSIS — Z1231 Encounter for screening mammogram for malignant neoplasm of breast: Secondary | ICD-10-CM | POA: Diagnosis not present

## 2024-09-14 DIAGNOSIS — H353132 Nonexudative age-related macular degeneration, bilateral, intermediate dry stage: Secondary | ICD-10-CM | POA: Diagnosis not present

## 2024-09-14 DIAGNOSIS — Z961 Presence of intraocular lens: Secondary | ICD-10-CM | POA: Diagnosis not present

## 2024-09-14 DIAGNOSIS — H52223 Regular astigmatism, bilateral: Secondary | ICD-10-CM | POA: Diagnosis not present

## 2024-09-14 DIAGNOSIS — D3132 Benign neoplasm of left choroid: Secondary | ICD-10-CM | POA: Diagnosis not present

## 2024-09-14 DIAGNOSIS — H524 Presbyopia: Secondary | ICD-10-CM | POA: Diagnosis not present

## 2024-09-14 DIAGNOSIS — H43812 Vitreous degeneration, left eye: Secondary | ICD-10-CM | POA: Diagnosis not present

## 2024-10-21 DIAGNOSIS — L821 Other seborrheic keratosis: Secondary | ICD-10-CM | POA: Diagnosis not present

## 2024-10-21 DIAGNOSIS — D225 Melanocytic nevi of trunk: Secondary | ICD-10-CM | POA: Diagnosis not present

## 2024-10-21 DIAGNOSIS — Z85828 Personal history of other malignant neoplasm of skin: Secondary | ICD-10-CM | POA: Diagnosis not present

## 2024-10-21 DIAGNOSIS — D224 Melanocytic nevi of scalp and neck: Secondary | ICD-10-CM | POA: Diagnosis not present

## 2024-10-21 DIAGNOSIS — D485 Neoplasm of uncertain behavior of skin: Secondary | ICD-10-CM | POA: Diagnosis not present

## 2024-10-21 DIAGNOSIS — L570 Actinic keratosis: Secondary | ICD-10-CM | POA: Diagnosis not present

## 2024-10-21 DIAGNOSIS — L814 Other melanin hyperpigmentation: Secondary | ICD-10-CM | POA: Diagnosis not present

## 2024-10-21 DIAGNOSIS — C44629 Squamous cell carcinoma of skin of left upper limb, including shoulder: Secondary | ICD-10-CM | POA: Diagnosis not present

## 2024-10-21 DIAGNOSIS — D692 Other nonthrombocytopenic purpura: Secondary | ICD-10-CM | POA: Diagnosis not present

## 2024-11-10 ENCOUNTER — Encounter: Payer: Self-pay | Admitting: Cardiovascular Disease

## 2024-11-10 ENCOUNTER — Ambulatory Visit: Attending: Cardiovascular Disease | Admitting: Cardiovascular Disease

## 2024-11-10 VITALS — BP 146/72 | HR 59 | Ht 61.0 in | Wt 126.6 lb

## 2024-11-10 DIAGNOSIS — I779 Disorder of arteries and arterioles, unspecified: Secondary | ICD-10-CM

## 2024-11-10 DIAGNOSIS — I739 Peripheral vascular disease, unspecified: Secondary | ICD-10-CM | POA: Diagnosis not present

## 2024-11-10 DIAGNOSIS — I2510 Atherosclerotic heart disease of native coronary artery without angina pectoris: Secondary | ICD-10-CM | POA: Diagnosis not present

## 2024-11-10 DIAGNOSIS — I701 Atherosclerosis of renal artery: Secondary | ICD-10-CM | POA: Diagnosis not present

## 2024-11-10 DIAGNOSIS — E785 Hyperlipidemia, unspecified: Secondary | ICD-10-CM | POA: Diagnosis not present

## 2024-11-10 DIAGNOSIS — I10 Essential (primary) hypertension: Secondary | ICD-10-CM | POA: Diagnosis not present

## 2024-11-10 NOTE — Patient Instructions (Signed)
 Medication Instructions:  No changes *If you need a refill on your cardiac medications before your next appointment, please call your pharmacy*  Lab Work: None ordered If you have labs (blood work) drawn today and your tests are completely normal, you will receive your results only by: MyChart Message (if you have MyChart) OR A paper copy in the mail If you have any lab test that is abnormal or we need to change your treatment, we will call you to review the results.  Testing/Procedures: None ordered  Follow-Up: At [facility], you and your health needs are our priority.  As part of our continuing mission to provide you with exceptional heart care, our providers are all part of one team.  This team includes your primary Cardiologist (physician) and Advanced Practice Providers or APPs (Physician Assistants and Nurse Practitioners) who all work together to provide you with the care you need, when you need it.  Your next appointment:   12 month(s)  Provider:   Dr. Alvenia Aus  We recommend signing up for the patient portal called "MyChart".  Sign up information is provided on this After Visit Summary.  MyChart is used to connect with patients for Virtual Visits (Telemedicine).  Patients are able to view lab/test results, encounter notes, upcoming appointments, etc.  Non-urgent messages can be sent to your provider as well.   To learn more about what you can do with MyChart, go to [email or web address].

## 2024-11-10 NOTE — Progress Notes (Signed)
 Cardiology Office Note   Date:  11/10/2024   ID:  Erica [name], DOB March 23, 1941, MRN [id number]  PCP:  Loreli Kins, MD  Cardiologist:  Dr. Wonda  No chief complaint on file.     History of Present Illness: Erica [name] is a 83 y.o. female who presents for a follow-up visit regarding peripheral arterial disease.    She has known history of coronary artery disease status post CABG in 2011, mild to moderate carotid disease, hyperlipidemia and essential hypertension.  She quit smoking at the age of 9. Cardiac catheterization in July,2019 showed significant restenosis in the right coronary artery which was treated with cutting balloon angioplasty.  There was significant calcified LAD disease which was treated with staged atherectomy and drug-eluting stent placement .  She is status post bilateral common iliac artery kissing stent placement in 2019 for severe claudication.  No evidence of infrainguinal disease. . She has been doing very well with no chest pain, shortness of breath or palpitations.  No lower extremity claudication.   Past Medical History:  Diagnosis Date   Anxiety    Chronic mid back pain    Coronary artery disease    status post multiple percutaneous interventions // Echocardiogram 10/22: EF 60-65, no RWMA, normal RVSF, RVSP 27.6, trivial MR   Depression    History of kidney stones 1990s X 1; 2016    Hyperlipidemia    Hypertension    Hypothyroidism    Myocardial infarction (HCC) 01/2010   Osteoarthritis    hands, back (10/15/2018)    Past Surgical History:  Procedure Laterality Date   ABDOMINAL AORTOGRAM W/LOWER EXTREMITY  10/15/2018   ABDOMINAL AORTOGRAM W/LOWER EXTREMITY N/A 10/15/2018   Procedure: ABDOMINAL AORTOGRAM W/LOWER EXTREMITY;  Surgeon: Darron Deatrice LABOR, MD;  Location: [facility] INVASIVE CV LAB;  Service: Cardiovascular;  Laterality: N/A;   CARDIAC CATHETERIZATION  12-2008   CATARACT EXTRACTION W/ INTRAOCULAR LENS IMPLANT Bilateral     CORONARY ANGIOPLASTY WITH STENT PLACEMENT  2006    right coronary artery with drug-eluting stent   CORONARY ANGIOPLASTY WITH STENT PLACEMENT  1998    bare-metal stent to the right coronary artery   CORONARY ANGIOPLASTY WITH STENT PLACEMENT  2008   drug-eluting stent placed in the left circumflex   CORONARY ARTERY BYPASS GRAFT  01-25-10   CABG X4   CORONARY ATHERECTOMY N/A 07/16/2018   Procedure: CORONARY ATHERECTOMY;  Surgeon: Wonda Sharper, MD;  Location: [facility] INVASIVE CV LAB;  Service: Cardiovascular;  Laterality: N/A;   CORONARY BALLOON ANGIOPLASTY N/A 06/04/2018   Procedure: CORONARY BALLOON ANGIOPLASTY;  Surgeon: Wonda Sharper, MD;  Location: [facility] INVASIVE CV LAB;  Service: Cardiovascular;  Laterality: N/A;   CORONARY PRESSURE/FFR STUDY N/A 06/04/2018   Procedure: INTRAVASCULAR PRESSURE WIRE/FFR STUDY;  Surgeon: Wonda Sharper, MD;  Location: [facility] INVASIVE CV LAB;  Service: Cardiovascular;  Laterality: N/A;   CYSTOSCOPY WITH RETROGRADE PYELOGRAM, URETEROSCOPY AND STENT PLACEMENT Right 10/18/2015   Procedure: CYSTOSCOPY WITH RETROGRADE PYELOGRAM,  AND STENT PLACEMENT;  Surgeon: Donnice Brooks, MD;  Location: [facility];  Service: Urology;  Laterality: Right;   CYSTOSCOPY/URETEROSCOPY/HOLMIUM LASER/STENT PLACEMENT Right 11/29/2015   Procedure: RIGHT URETEROSCOPY/RIGHT RETROGRADE PYELOGRAM/ RIGHT STENT REPLACEMENT;  Surgeon: Donnice Brooks, MD;  Location: [facility];  Service: Urology;  Laterality: Right;   FOOT SURGERY Bilateral 2008-2016   bones shortened prox to  great toes   HAMMER TOE SURGERY Right 2008   2nd and 3rd digits   LEFT HEART CATH AND CORS/GRAFTS ANGIOGRAPHY N/A 06/04/2018  Procedure: LEFT HEART CATH AND CORS/GRAFTS ANGIOGRAPHY;  Surgeon: Wonda Sharper, MD;  Location: [facility] INVASIVE CV LAB;  Service: Cardiovascular;  Laterality: N/A;   PERIPHERAL VASCULAR INTERVENTION Bilateral 10/15/2018   PERIPHERAL VASCULAR INTERVENTION Bilateral 10/15/2018   Procedure:  PERIPHERAL VASCULAR INTERVENTION;  Surgeon: Darron Deatrice LABOR, MD;  Location: [facility] INVASIVE CV LAB;  Service: Cardiovascular;  Laterality: Bilateral;   ULTRASOUND GUIDANCE FOR VASCULAR ACCESS  06/04/2018   Procedure: Ultrasound Guidance For Vascular Access;  Surgeon: Wonda Sharper, MD;  Location: [facility] INVASIVE CV LAB;  Service: Cardiovascular;;     Current Outpatient Medications  Medication Sig Dispense Refill   acetaminophen  (TYLENOL ) 500 MG tablet Take 1,000 mg by mouth every 6 (six) hours as needed for mild pain (pain score 1-3) or moderate pain (pain score 4-6).     amLODipine  (NORVASC ) 5 MG tablet Take 5 mg by mouth daily.     calcium  citrate (CALCITRATE - DOSED IN MG ELEMENTAL CALCIUM ) 950 (200 Ca) MG tablet Take by mouth.     clopidogrel  (PLAVIX ) 75 MG tablet Take 1 tablet (75 mg total) by mouth daily. 90 tablet 3   denosumab  (PROLIA ) 60 MG/ML SOSY injection Inject 1 mL into the skin every 6 (six) months.     ezetimibe  (ZETIA ) 10 MG tablet TAKE ONE TABLET BY MOUTH DAILY 90 tablet 3   isosorbide  mononitrate (IMDUR ) 30 MG 24 hr tablet TAKE 1 TABLET EVERY DAY 90 tablet 3   levothyroxine  (SYNTHROID ) 125 MCG tablet Take 125 mcg by mouth daily.     minoxidil  (LONITEN ) 2.5 MG tablet Take 0.5 tablets by mouth daily.     Multiple Vitamins-Minerals (WOMENS MULTIVITAMIN PO) Take 1 tablet by mouth daily.      nitroGLYCERIN  (NITROSTAT ) 0.4 MG SL tablet Place 1 tablet (0.4 mg total) under the tongue every 5 (five) minutes as needed for chest pain. 75 tablet 2   rosuvastatin  (CRESTOR ) 40 MG tablet Take 40 mg by mouth daily.     sertraline  (ZOLOFT ) 50 MG tablet Take 50 mg by mouth daily.      No current facility-administered medications for this visit.    Allergies:   Isosulfan blue, Boniva [ibandronate], and Morphine and codeine    Social History:  The patient  reports that she quit smoking about 27 years ago. Her smoking use included cigarettes. She started smoking about 65 years ago. She has a 37  pack-year smoking history. She has never used smokeless tobacco. She reports that she does not currently use alcohol. She reports that she does not use drugs.   Family History:  The patient's family history includes Other in an other family member; Other (age of onset: 33) in her mother.    ROS:  Please see the history of present illness.   Otherwise, review of systems are positive for none.   All other systems are reviewed and negative.    PHYSICAL EXAM: VS:  BP (!) 146/72 (BP Location: Left Arm, Patient Position: Sitting)   Pulse (!) 59   Ht 5' 1 (1.549 m)   Wt 126 lb 9.6 oz (57.4 kg)   SpO2 97%   BMI 23.92 kg/m  , BMI Body mass index is 23.92 kg/m. GEN: Well nourished, well developed, in no acute distress  HEENT: normal  Neck: no JVD, or masses.  Bilateral carotid bruits Cardiac: RRR; no murmurs, rubs, or gallops,no edema  Respiratory:  clear to auscultation bilaterally, normal work of breathing GI: soft, nontender, nondistended, + BS MS: no deformity or atrophy  Skin: warm and dry, no rash Neuro:  Strength and sensation are intact Psych: euthymic mood, full affect   EKG:  EKG is not ordered today.    Recent Labs: 01/02/2024: ALT 17; B Natriuretic Peptide 90.5; BUN 18; Creatinine, Ser 0.75; Hemoglobin 12.0; Magnesium 2.2; Platelets 193; Potassium 3.4; Sodium 137; TSH 1.798    Lipid Panel    Component Value Date/Time   CHOL 148 02/15/2020 1109   TRIG 103 02/15/2020 1109   HDL 69 02/15/2020 1109   CHOLHDL 2.1 02/15/2020 1109   CHOLHDL 2.7 01/09/2010 0506   VLDL 17 01/09/2010 0506   LDLCALC 60 02/15/2020 1109      Wt Readings from Last 3 Encounters:  11/10/24 126 lb 9.6 oz (57.4 kg)  07/01/24 119 lb 12.8 oz (54.3 kg)  05/29/24 119 lb 6.6 oz (54.2 kg)           No data to display            ASSESSMENT AND PLAN:  1.  Peripheral arterial disease: Status post bilateral common iliac artery stent placement.  No recurrent claudications.  Most recent  Doppler studies in July 2025 showed normal ABI bilaterally with patent iliac stents.  Repeat studies in July of next year.  2.  Coronary artery disease involving native coronary arteries without angina: Continue medical therapy .    3.  Renal artery stenosis: Right renal artery stenosis noted on CTA .  No indication for revascularization given that her blood pressure is controlled with no heart failure or progressive chronic kidney disease.  4.  Essential hypertension: Blood pressure is reasonably controlled on amlodipine , Imdur  and small dose minoxidil .  5.   Hyperlipidemia: She is currently on high-dose rosuvastatin  and Zetia .  I reviewed most recent labs done in June which showed an LDL of 53.  6.  Carotid artery disease: She does have bilateral carotid bruits but most recent carotid Doppler in July of 2024 showed mild nonobstructive disease.  No need to repeat .    Disposition:   FU with me in 1 year  Signed,  Deatrice Cage, MD  11/10/2024 10:30 AM    [facility]

## 2024-11-17 ENCOUNTER — Ambulatory Visit: Admitting: Cardiovascular Disease

## 2024-11-18 LAB — LAB REPORT - SCANNED
A1c: 6.8
Albumin, Urine POC: 1.79
Creatinine, POC: 86 mg/dL
EGFR: 64
Microalb Creat Ratio: 20.8

## 2024-12-10 ENCOUNTER — Encounter: Payer: Self-pay | Admitting: Cardiovascular Disease
